# Patient Record
Sex: Male | Born: 1965 | Race: White | Hispanic: No | Marital: Single | Smoking: Former smoker
Health system: Southern US, Community
[De-identification: ages and names within clinical notes are randomized; demographics above are authoritative.]

## PROBLEM LIST (undated history)

## (undated) DIAGNOSIS — Z933 Colostomy status: Secondary | ICD-10-CM

## (undated) DIAGNOSIS — E11319 Type 2 diabetes mellitus with unspecified diabetic retinopathy without macular edema: Secondary | ICD-10-CM

## (undated) DIAGNOSIS — L97529 Non-pressure chronic ulcer of other part of left foot with unspecified severity: Secondary | ICD-10-CM

## (undated) DIAGNOSIS — N1832 Chronic kidney disease, stage 3b: Secondary | ICD-10-CM

## (undated) DIAGNOSIS — C20 Malignant neoplasm of rectum: Secondary | ICD-10-CM

## (undated) DIAGNOSIS — C801 Malignant (primary) neoplasm, unspecified: Secondary | ICD-10-CM

## (undated) DIAGNOSIS — G629 Polyneuropathy, unspecified: Secondary | ICD-10-CM

## (undated) DIAGNOSIS — E1142 Type 2 diabetes mellitus with diabetic polyneuropathy: Secondary | ICD-10-CM

## (undated) DIAGNOSIS — L97522 Non-pressure chronic ulcer of other part of left foot with fat layer exposed: Secondary | ICD-10-CM

## (undated) DIAGNOSIS — E0843 Diabetes mellitus due to underlying condition with diabetic autonomic (poly)neuropathy: Secondary | ICD-10-CM

## (undated) DIAGNOSIS — E11621 Type 2 diabetes mellitus with foot ulcer: Secondary | ICD-10-CM

## (undated) DIAGNOSIS — E119 Type 2 diabetes mellitus without complications: Secondary | ICD-10-CM

## (undated) HISTORY — PX: COLON SURGERY: SHX602

---

## 2019-04-11 ENCOUNTER — Encounter: Payer: Self-pay | Admitting: Emergency Medicine

## 2019-04-11 ENCOUNTER — Emergency Department: Payer: Medicare Other

## 2019-04-11 ENCOUNTER — Other Ambulatory Visit: Payer: Self-pay

## 2019-04-11 ENCOUNTER — Emergency Department
Admission: EM | Admit: 2019-04-11 | Discharge: 2019-04-12 | Disposition: A | Discharge status: Home / Self Care | Payer: Medicare Other | Attending: Emergency Medicine | Admitting: Emergency Medicine

## 2019-04-11 DIAGNOSIS — Y92009 Unspecified place in unspecified non-institutional (private) residence as the place of occurrence of the external cause: Secondary | ICD-10-CM | POA: Insufficient documentation

## 2019-04-11 DIAGNOSIS — Z87891 Personal history of nicotine dependence: Secondary | ICD-10-CM | POA: Insufficient documentation

## 2019-04-11 DIAGNOSIS — Y9301 Activity, walking, marching and hiking: Secondary | ICD-10-CM | POA: Diagnosis not present

## 2019-04-11 DIAGNOSIS — Y999 Unspecified external cause status: Secondary | ICD-10-CM | POA: Insufficient documentation

## 2019-04-11 DIAGNOSIS — R030 Elevated blood-pressure reading, without diagnosis of hypertension: Secondary | ICD-10-CM | POA: Diagnosis not present

## 2019-04-11 DIAGNOSIS — Y92000 Kitchen of unspecified non-institutional (private) residence as  the place of occurrence of the external cause: Secondary | ICD-10-CM | POA: Diagnosis not present

## 2019-04-11 DIAGNOSIS — E119 Type 2 diabetes mellitus without complications: Secondary | ICD-10-CM | POA: Diagnosis not present

## 2019-04-11 DIAGNOSIS — Z85038 Personal history of other malignant neoplasm of large intestine: Secondary | ICD-10-CM | POA: Diagnosis not present

## 2019-04-11 DIAGNOSIS — W010XXA Fall on same level from slipping, tripping and stumbling without subsequent striking against object, initial encounter: Secondary | ICD-10-CM | POA: Diagnosis not present

## 2019-04-11 DIAGNOSIS — M549 Dorsalgia, unspecified: Secondary | ICD-10-CM | POA: Diagnosis not present

## 2019-04-11 DIAGNOSIS — R55 Syncope and collapse: Secondary | ICD-10-CM | POA: Diagnosis not present

## 2019-04-11 DIAGNOSIS — Y9389 Activity, other specified: Secondary | ICD-10-CM | POA: Diagnosis not present

## 2019-04-11 DIAGNOSIS — W19XXXA Unspecified fall, initial encounter: Secondary | ICD-10-CM

## 2019-04-11 DIAGNOSIS — Z933 Colostomy status: Secondary | ICD-10-CM | POA: Diagnosis not present

## 2019-04-11 HISTORY — DX: Type 2 diabetes mellitus without complications: E11.9

## 2019-04-11 HISTORY — DX: Malignant (primary) neoplasm, unspecified: C80.1

## 2019-04-11 LAB — COMPREHENSIVE METABOLIC PANEL
ALT: 12 U/L (ref 0–44)
AST: 22 U/L (ref 15–41)
Albumin: 2.2 g/dL — ABNORMAL LOW (ref 3.5–5.0)
Alkaline Phosphatase: 101 U/L (ref 38–126)
Anion gap: 8 (ref 5–15)
BUN: 26 mg/dL — ABNORMAL HIGH (ref 6–20)
CO2: 28 mmol/L (ref 22–32)
Calcium: 8.6 mg/dL — ABNORMAL LOW (ref 8.9–10.3)
Chloride: 97 mmol/L — ABNORMAL LOW (ref 98–111)
Creatinine, Ser: 1.49 mg/dL — ABNORMAL HIGH (ref 0.61–1.24)
GFR calc Af Amer: >60 mL/min (ref >60)
GFR calc non Af Amer: 52 mL/min — ABNORMAL LOW (ref >60)
Glucose, Bld: 373 mg/dL — ABNORMAL HIGH (ref 70–99)
Potassium: 3.9 mmol/L (ref 3.5–5.1)
Sodium: 133 mmol/L — ABNORMAL LOW (ref 135–145)
Total Bilirubin: 0.6 mg/dL (ref 0.3–1.2)
Total Protein: 5.8 g/dL — ABNORMAL LOW (ref 6.5–8.1)

## 2019-04-11 LAB — URINALYSIS, COMPLETE (UACMP) WITH MICROSCOPIC
Bacteria, UA: NONE SEEN
Bilirubin Urine: NEGATIVE
Glucose, UA: >=500 mg/dL — AB
Hgb urine dipstick: NEGATIVE
Ketones, ur: NEGATIVE mg/dL
Leukocytes,Ua: NEGATIVE
Nitrite: NEGATIVE
Protein, ur: >=300 mg/dL — AB
Specific Gravity, Urine: 1.014 (ref 1.005–1.030)
Squamous Epithelial / HPF: NONE SEEN (ref 0–5)
pH: 6 (ref 5.0–8.0)

## 2019-04-11 LAB — CBC
HCT: 30.4 % — ABNORMAL LOW (ref 39.0–52.0)
Hemoglobin: 9.9 g/dL — ABNORMAL LOW (ref 13.0–17.0)
MCH: 27.5 pg (ref 26.0–34.0)
MCHC: 32.6 g/dL (ref 30.0–36.0)
MCV: 84.4 fL (ref 80.0–100.0)
Platelets: 264 10*3/uL (ref 150–400)
RBC: 3.6 MIL/uL — ABNORMAL LOW (ref 4.22–5.81)
RDW: 13 % (ref 11.5–15.5)
WBC: 7.1 10*3/uL (ref 4.0–10.5)
nRBC: 0 % (ref 0.0–0.2)

## 2019-04-11 LAB — TROPONIN I (HIGH SENSITIVITY)
Troponin I (High Sensitivity): 40 ng/L — ABNORMAL HIGH (ref <18)
Troponin I (High Sensitivity): 43 ng/L — ABNORMAL HIGH (ref <18)

## 2019-04-11 MED ORDER — CYCLOBENZAPRINE HCL 10 MG PO TABS: 10.0000 mg | ORAL_TABLET | Freq: Three times a day (TID) | ORAL | 0 refills | Status: DC | PRN

## 2019-04-11 MED ORDER — SODIUM CHLORIDE 0.9 % IV BOLUS
1000.0000 mL | Freq: Once | INTRAVENOUS | Status: AC
  Administered 2019-04-11: 1000 mL via INTRAVENOUS

## 2019-04-11 MED ORDER — ACETAMINOPHEN 500 MG PO TABS
1000.0000 mg | ORAL_TABLET | Freq: Once | ORAL | Status: AC
  Administered 2019-04-11: 1000 mg via ORAL
  Filled 2019-04-11: qty 2

## 2019-04-11 MED ORDER — CYCLOBENZAPRINE HCL 10 MG PO TABS
10.0000 mg | ORAL_TABLET | Freq: Once | ORAL | Status: AC
  Administered 2019-04-11: 10 mg via ORAL
  Filled 2019-04-11: qty 1

## 2019-04-11 NOTE — ED Notes (Signed)
Pt trx to x-ray 

## 2019-04-11 NOTE — ED Notes (Signed)
Family updated as to patient's status.provided pt w/ phone to speak w/ daughter.

## 2019-04-11 NOTE — ED Notes (Signed)
EMS reported pt had not been compliant with his insulin however pt states he takes his insulin intermittently.

## 2019-04-11 NOTE — ED Notes (Signed)
Pt on cell phone in the bed at this time.

## 2019-04-11 NOTE — ED Triage Notes (Signed)
Pt arrives from home following a syncopal episode at home with a fall approximately 1.5 hours ago. Pt stated to EMS he has been dizzy and not feeling right all day. Pt had BG 388 for EMS, pt insulin dependent diabetic but has not been compliant with meds since July. Pt was hypertensive for EMS w/ BP 190/110.

## 2019-04-11 NOTE — ED Notes (Signed)
Pt states he had been feeling dizzy today and got up to fix something to eat and had a near syncopal episode. Pt states he got real dizzy and almost lost consciousness. Pt states he fell flat on his back hurting the posterior ribs and spine.

## 2019-04-11 NOTE — ED Provider Notes (Signed)
[facility] Emergency Department Provider Note  Time seen: 8:26 PM  I have reviewed the triage vital signs and the nursing notes.   HISTORY  Chief Complaint Fall   HPI Tibor Zakowski is a 54 y.o. male with a past medical history of diabetes, colon cancer status post colostomy, presents to the emergency department for syncopal episode and back pain.  According to the patient states he was feeling somewhat lightheaded today, states he went to go to the kitchen to get something to eat and states while returning back from the kitchen he felt very lightheaded and had a brief syncopal versus near syncopal event and fell backwards landing flat on his back.  Patient states pain in his mid back since falling.  Patient denies any recent nausea vomiting, fever cough congestion or shortness of breath.  Patient is insulin-dependent diabetic, states he has missed a few doses here and there but overall takes his insulin as prescribed.   Past Medical History:  Diagnosis Date  . Cancer ([place])   . Diabetes mellitus without complication ([place])     There are no problems to display for this patient.   Past Surgical History:  Procedure Laterality Date  . COLON SURGERY      Prior to Admission medications   Not on File    Not on File  History reviewed. No pertinent family history.  Social History Social History   Tobacco Use  . Smoking status: Former Smoker    Quit date: 12/09/2014    Years since quitting: 4.3  . Smokeless tobacco: Never Used  Substance Use Topics  . Alcohol use: Not Currently  . Drug use: Yes    Types: Marijuana    Comment: intermittent    Review of Systems Constitutional: Possible brief LOC Cardiovascular: Negative for chest pain. Respiratory: Negative for shortness of breath. Gastrointestinal: Negative for abdominal pain, vomiting  Musculoskeletal: Negative for musculoskeletal complaints Neurological: Negative for headache All other ROS  negative  ____________________________________________   PHYSICAL EXAM:  VITAL SIGNS: ED Triage Vitals  Enc Vitals Group     BP 04/11/19 2015 (!) 167/104     Pulse Rate 04/11/19 2015 61     Resp 04/11/19 2015 17     Temp 04/11/19 2015 98.5 F (36.9 C)     Temp Source 04/11/19 2015 Oral     SpO2 04/11/19 2015 100 %     Weight 04/11/19 2017 175 lb (79.4 kg)     Height 04/11/19 2017 5' 6.5" (1.689 m)     Head Circumference --      Peak Flow --      Pain Score 04/11/19 2015 6     Pain Loc --      Pain Edu? --      Excl. in [place]? --     Constitutional: Alert and oriented. Well appearing and in no distress. Eyes: Normal exam ENT      Head: Normocephalic and atraumatic.      Mouth/Throat: Mucous membranes are moist. Cardiovascular: Normal rate, regular rhythm.  Respiratory: Normal respiratory effort without tachypnea nor retractions. Breath sounds are clear Gastrointestinal: Soft and nontender. No distention.  Musculoskeletal: Nontender with normal range of motion in all extremities.  Neurologic:  Normal speech and language. No gross focal neurologic deficits Skin:  Skin is warm, dry and intact.  Psychiatric: Mood and affect are normal.   ____________________________________________    EKG  EKG viewed and interpreted by myself shows a normal sinus rhythm at  74 bpm with a narrow QRS, normal axis, normal intervals, no concerning ST changes.  ____________________________________________    RADIOLOGY  X-rays are negative for acute abnormality.  ____________________________________________   INITIAL IMPRESSION / ASSESSMENT AND PLAN / ED COURSE  Pertinent labs & imaging results that were available during my care of the patient were reviewed by me and considered in my medical decision making (see chart for details).   Patient presents to the emergency department for syncopal event and back pain after falling.  Overall patient appears well, reassuring vitals besides mild  hypertension.  Reassuring physical exam besides mid back pain.  We will obtain x-ray images of the thoracic and lumbar spine.  We will check labs including cardiac enzyme as well as abdominal labs, IV hydrate and continue to closely monitor.  Patient agreeable plan of care.  Does not wish for pain medication at this time.  Patient's labs show an elevated blood glucose, but normal anion gap.  Does show some mild renal insufficiency.  We will IV hydrate.  X-rays are negative for traumatic injury.  Patient is troponin has resulted elevated at 40 however given his renal insufficiency this could be related to his kidney disease.  We will repeat a troponin to ensure no ACS.  EKG is reassuring.  Patient does not wish for any pain medication for his back but states he continues have some back pain.  Description seems more of paraspinal spasm.  X-rays are negative.  We will dose Flexeril and Tylenol.  Pt care signed out to Dr. Owens Shark.   Tabius Torain was evaluated in Emergency Department on 04/11/2019 for the symptoms described in the history of present illness. He was evaluated in the context of the global COVID-19 pandemic, which necessitated consideration that the patient might be at risk for infection with the SARS-CoV-2 virus that causes COVID-19. Institutional protocols and algorithms that pertain to the evaluation of patients at risk for COVID-19 are in a state of rapid change based on information released by regulatory bodies including the CDC and federal and state organizations. These policies and algorithms were followed during the patient's care in the ED.  ____________________________________________   FINAL CLINICAL IMPRESSION(S) / ED DIAGNOSES  Back pain Syncopal event   Harvest Dark, MD 04/11/19 2322

## 2019-04-12 MED ORDER — TRAMADOL HCL 50 MG PO TABS
50.0000 mg | ORAL_TABLET | Freq: Once | ORAL | Status: AC
  Administered 2019-04-12: 50 mg via ORAL
  Filled 2019-04-12: qty 1

## 2019-05-05 ENCOUNTER — Ambulatory Visit: Payer: Medicare Other | Attending: Internal Medicine

## 2019-05-05 DIAGNOSIS — Z23 Encounter for immunization: Secondary | ICD-10-CM

## 2019-05-05 NOTE — Progress Notes (Signed)
   Covid-19 Vaccination Clinic  Name:  Jason Jennings    MRN: [id number] DOB: 1965/03/27  05/05/2019  [name] was observed post Covid-19 immunization for 15 minutes without incident. He was provided with Vaccine Information Sheet and instruction to access the V-Safe system.   [name] was instructed to call 911 with any severe reactions post vaccine: Marland Kitchen Difficulty breathing  . Swelling of face and throat  . A fast heartbeat  . A bad rash all over body  . Dizziness and weakness   Immunizations Administered    Name Date Dose VIS Date Route   Pfizer COVID-19 Vaccine 05/05/2019 12:46 PM 0.3 mL 01/18/2019 Intramuscular   Manufacturer: [place]   Lot: [id number]   [place]: [id number]

## 2019-05-29 ENCOUNTER — Ambulatory Visit: Payer: Medicare Other | Attending: Internal Medicine

## 2019-05-29 DIAGNOSIS — Z23 Encounter for immunization: Secondary | ICD-10-CM

## 2019-05-29 NOTE — Progress Notes (Signed)
   Covid-19 Vaccination Clinic  Name:  Darreon Anspach    MRN: [id number] DOB: [birth year]  05/29/2019  Mr. Stofflet was observed post Covid-19 immunization for 15 minutes without incident. He was provided with Vaccine Information Sheet and instruction to access the V-Safe system.   Mr. Silkworth was instructed to call 911 with any severe reactions post vaccine: Marland Kitchen Difficulty breathing  . Swelling of face and throat  . A fast heartbeat  . A bad rash all over body  . Dizziness and weakness   Immunizations Administered    Name Date Dose VIS Date Route   Pfizer COVID-19 Vaccine 05/29/2019 10:29 AM 0.3 mL 04/03/2018 Intramuscular   Manufacturer: [place]   Lot: [id number]   [place]: [id number]

## 2020-05-16 ENCOUNTER — Emergency Department: Payer: Medicare Other

## 2020-05-16 ENCOUNTER — Encounter: Payer: Self-pay | Admitting: Intensive Care

## 2020-05-16 ENCOUNTER — Other Ambulatory Visit: Payer: Self-pay

## 2020-05-16 ENCOUNTER — Emergency Department
Admission: EM | Admit: 2020-05-16 | Discharge: 2020-05-16 | Disposition: A | Discharge status: Home / Self Care | Payer: Medicare Other | Attending: Emergency Medicine | Admitting: Emergency Medicine

## 2020-05-16 DIAGNOSIS — R112 Nausea with vomiting, unspecified: Secondary | ICD-10-CM | POA: Diagnosis not present

## 2020-05-16 DIAGNOSIS — S80912A Unspecified superficial injury of left knee, initial encounter: Secondary | ICD-10-CM | POA: Diagnosis present

## 2020-05-16 DIAGNOSIS — W109XXA Fall (on) (from) unspecified stairs and steps, initial encounter: Secondary | ICD-10-CM | POA: Insufficient documentation

## 2020-05-16 DIAGNOSIS — R12 Heartburn: Secondary | ICD-10-CM | POA: Insufficient documentation

## 2020-05-16 DIAGNOSIS — E1140 Type 2 diabetes mellitus with diabetic neuropathy, unspecified: Secondary | ICD-10-CM | POA: Insufficient documentation

## 2020-05-16 DIAGNOSIS — S82142A Displaced bicondylar fracture of left tibia, initial encounter for closed fracture: Secondary | ICD-10-CM | POA: Insufficient documentation

## 2020-05-16 DIAGNOSIS — Z859 Personal history of malignant neoplasm, unspecified: Secondary | ICD-10-CM | POA: Diagnosis not present

## 2020-05-16 DIAGNOSIS — Z87891 Personal history of nicotine dependence: Secondary | ICD-10-CM | POA: Diagnosis not present

## 2020-05-16 HISTORY — DX: Polyneuropathy, unspecified: G62.9

## 2020-05-16 LAB — COMPREHENSIVE METABOLIC PANEL
ALT: 20 U/L (ref 0–44)
AST: 32 U/L (ref 15–41)
Albumin: 3.5 g/dL (ref 3.5–5.0)
Alkaline Phosphatase: 64 U/L (ref 38–126)
Anion gap: 13 (ref 5–15)
BUN: 41 mg/dL — ABNORMAL HIGH (ref 6–20)
CO2: 24 mmol/L (ref 22–32)
Calcium: 9.7 mg/dL (ref 8.9–10.3)
Chloride: 100 mmol/L (ref 98–111)
Creatinine, Ser: 2.19 mg/dL — ABNORMAL HIGH (ref 0.61–1.24)
GFR, Estimated: 34 mL/min — ABNORMAL LOW (ref >60)
Glucose, Bld: 441 mg/dL — ABNORMAL HIGH (ref 70–99)
Potassium: 4.4 mmol/L (ref 3.5–5.1)
Sodium: 137 mmol/L (ref 135–145)
Total Bilirubin: 0.5 mg/dL (ref 0.3–1.2)
Total Protein: 6.4 g/dL — ABNORMAL LOW (ref 6.5–8.1)

## 2020-05-16 LAB — CBC WITH DIFFERENTIAL/PLATELET
Abs Immature Granulocytes: 0.02 10*3/uL (ref 0.00–0.07)
Basophils Absolute: 0 10*3/uL (ref 0.0–0.1)
Basophils Relative: 0 %
Eosinophils Absolute: 0 10*3/uL (ref 0.0–0.5)
Eosinophils Relative: 0 %
HCT: 32.9 % — ABNORMAL LOW (ref 39.0–52.0)
Hemoglobin: 11.4 g/dL — ABNORMAL LOW (ref 13.0–17.0)
Immature Granulocytes: 0 %
Lymphocytes Relative: 9 %
Lymphs Abs: 0.6 10*3/uL — ABNORMAL LOW (ref 0.7–4.0)
MCH: 28.9 pg (ref 26.0–34.0)
MCHC: 34.7 g/dL (ref 30.0–36.0)
MCV: 83.5 fL (ref 80.0–100.0)
Monocytes Absolute: 0.4 10*3/uL (ref 0.1–1.0)
Monocytes Relative: 6 %
Neutro Abs: 5.7 10*3/uL (ref 1.7–7.7)
Neutrophils Relative %: 85 %
Platelets: 197 10*3/uL (ref 150–400)
RBC: 3.94 MIL/uL — ABNORMAL LOW (ref 4.22–5.81)
RDW: 12.9 % (ref 11.5–15.5)
WBC: 6.8 10*3/uL (ref 4.0–10.5)
nRBC: 0 % (ref 0.0–0.2)

## 2020-05-16 LAB — LIPASE, BLOOD: Lipase: 42 U/L (ref 11–51)

## 2020-05-16 LAB — TROPONIN I (HIGH SENSITIVITY): Troponin I (High Sensitivity): 20 ng/L — ABNORMAL HIGH (ref <18)

## 2020-05-16 MED ORDER — HYDROCODONE-ACETAMINOPHEN 5-325 MG PO TABS: 1.0000 | ORAL_TABLET | ORAL | 0 refills | Status: DC | PRN

## 2020-05-16 MED ORDER — ONDANSETRON HCL 8 MG PO TABS: 8.0000 mg | ORAL_TABLET | Freq: Three times a day (TID) | ORAL | 1 refills | Status: DC | PRN

## 2020-05-16 MED ORDER — ONDANSETRON HCL 4 MG/2ML IJ SOLN
4.0000 mg | Freq: Once | INTRAMUSCULAR | Status: AC
Start: 2020-05-16 — End: 2020-05-16
  Administered 2020-05-16: 4 mg via INTRAVENOUS
  Filled 2020-05-16: qty 2

## 2020-05-16 MED ORDER — ONDANSETRON 4 MG PO TBDP
ORAL_TABLET | ORAL | Status: AC
  Administered 2020-05-16: 8 mg
  Filled 2020-05-16: qty 2

## 2020-05-16 NOTE — ED Notes (Signed)
Pt back from x-ray. Continuous cardiac and pulse ox monitoring.

## 2020-05-16 NOTE — ED Triage Notes (Signed)
Patient reports mechanical fall down three stairs and injuring left knee. Patient actively vomiting during triage. HX colon cancer and in remission. Colostomy present

## 2020-05-16 NOTE — ED Provider Notes (Signed)
[facility] Emergency Department Provider Note  ____________________________________________  Time seen: Approximately 5:44 PM  I have reviewed the triage vital signs and the nursing notes.   HISTORY  Chief Complaint Knee Pain and Emesis    HPI Jason Jennings is a 55 y.o. male who presents the emergency department complaining of left knee pain as well as "heartburn" with nausea and emesis.  Patient states that he was coming down a flight of stairs.  He has a history of neuropathy, states that he has to take them one at a time.  He was stepping down with the left leg when the right leg gave out from underneath him.  This is not a typical but patient states that he missed a stair, landed awkwardly on the left knee and had immediate pain.  No other injury from this encounter.  He is having pain, swelling to the left knee and is unable to bear weight currently.  Patient states that he is also been having some heartburn and nausea and emesis that started after the injury.  No history of chronic GERD symptoms.  No cardiac history.  He denies any shortness of breath.  Patient states there is some discomfort in the epigastric region but it does not radiate anywhere.  Vomiting is nonbilious and nonbloody.         Past Medical History:  Diagnosis Date  . Cancer ([place])   . Diabetes mellitus without complication ([place])   . Neuropathy     There are no problems to display for this patient.   Past Surgical History:  Procedure Laterality Date  . COLON SURGERY      Prior to Admission medications   Medication Sig Start Date End Date Taking? Authorizing Provider  HYDROcodone-acetaminophen (NORCO/VICODIN) 5-325 MG tablet Take 1 tablet by mouth every 4 (four) hours as needed for moderate pain. 05/16/20 05/16/21 Yes Clifton Kovacic, Charline Bills, PA-C  ondansetron (ZOFRAN) 8 MG tablet Take 1 tablet (8 mg total) by mouth every 8 (eight) hours as needed for nausea or vomiting. 05/16/20  Yes  Odie Rauen, Charline Bills, PA-C  cyclobenzaprine (FLEXERIL) 10 MG tablet Take 1 tablet (10 mg total) by mouth 3 (three) times daily as needed for muscle spasms. 04/11/19   Harvest Dark, MD    Allergies Patient has no known allergies.  History reviewed. No pertinent family history.  Social History Social History   Tobacco Use  . Smoking status: Former Smoker    Quit date: 12/09/2014    Years since quitting: 5.4  . Smokeless tobacco: Never Used  Vaping Use  . Vaping Use: Never used  Substance Use Topics  . Alcohol use: Not Currently  . Drug use: Not Currently    Types: Marijuana     Review of Systems  Constitutional: No fever/chills Eyes: No visual changes. No discharge ENT: No upper respiratory complaints. Cardiovascular: no chest pain.Marland Kitchen  Positive for "heartburn." Respiratory: no cough. No SOB. Gastrointestinal: No abdominal pain.  Positive for nausea and emesis no diarrhea.  No constipation. Musculoskeletal: Positive for left knee pain/injury Skin: Negative for rash, abrasions, lacerations, ecchymosis. Neurological: Negative for headaches, focal weakness or numbness.  10 System ROS otherwise negative.  ____________________________________________   PHYSICAL EXAM:  VITAL SIGNS: ED Triage Vitals [05/16/20 1740]  Enc Vitals Group     BP      Pulse      Resp      Temp 97.9 F (36.6 C)     Temp Source Oral  SpO2      Weight 195 lb (88.5 kg)     Height 5' 6.5" (1.689 m)     Head Circumference      Peak Flow      Pain Score 10     Pain Loc      Pain Edu?      Excl. in [place]?      Constitutional: Alert and oriented. Well appearing and in no acute distress. Eyes: Conjunctivae are normal. PERRL. EOMI. Head: Atraumatic. ENT:      Ears:       Nose: No congestion/rhinnorhea.      Mouth/Throat: Mucous membranes are moist.  Neck: No stridor.    Cardiovascular: Normal rate, regular rhythm. Normal S1 and S2.  No appreciable murmurs, rubs, gallops.  Good  peripheral circulation. Respiratory: Normal respiratory effort without tachypnea or retractions. Lungs CTAB. Good air entry to the bases with no decreased or absent breath sounds. Gastrointestinal: Bowel sounds 4 quadrants.  Soft to palpation all quadrants.  Minimally tender to palpation in the epigastric region.. No guarding or rigidity. No palpable masses. No distention. No CVA tenderness. Musculoskeletal: Full range of motion to all extremities. No gross deformities appreciated.  Evaluation of the right knee reveals mild edema globally about the knee with positive ballottement on exam.  Patient is tender along the medial joint line and over the patella itself.  No other tenderness to palpation.  No palpable crepitus.  Special tests of the knee are deferred until imaging returns.  Dorsalis pedis pulses sensation intact distally.  After imaging returns with findings consistent with fracture, no range of motion was performed. Neurologic:  Normal speech and language. No gross focal neurologic deficits are appreciated.  Skin:  Skin is warm, dry and intact. No rash noted. Psychiatric: Mood and affect are normal. Speech and behavior are normal. Patient exhibits appropriate insight and judgement.   ____________________________________________   LABS (all labs ordered are listed, but only abnormal results are displayed)  Labs Reviewed  COMPREHENSIVE METABOLIC PANEL - Abnormal; Notable for the following components:      Result Value   Glucose, Bld 441 (*)    BUN 41 (*)    Creatinine, Ser 2.19 (*)    Total Protein 6.4 (*)    GFR, Estimated 34 (*)    All other components within normal limits  CBC WITH DIFFERENTIAL/PLATELET - Abnormal; Notable for the following components:   RBC 3.94 (*)    Hemoglobin 11.4 (*)    HCT 32.9 (*)    Lymphs Abs 0.6 (*)    All other components within normal limits  TROPONIN I (HIGH SENSITIVITY) - Abnormal; Notable for the following components:   Troponin I (High  Sensitivity) 20 (*)    All other components within normal limits  LIPASE, BLOOD  TROPONIN I (HIGH SENSITIVITY)   ____________________________________________  EKG   ____________________________________________  RADIOLOGY I personally viewed and evaluated these images as part of my medical decision making, as well as reviewing the written report by the radiologist.  ED Provider Interpretation: Lateral tibial plateau fracture with 7 mm of depression.  DG Chest 2 View  Result Date: 05/16/2020 CLINICAL DATA:  Chest pain. EXAM: CHEST - 2 VIEW COMPARISON:  None. FINDINGS: Cardiomediastinal silhouette is normal. Mediastinal contours appear intact. There is no evidence of focal airspace consolidation, pleural effusion or pneumothorax. There is a mild compression deformity of one of the lower thoracic vertebral bodies. Soft tissues are grossly normal. IMPRESSION: 1. No active cardiopulmonary disease.  2. Mild compression deformity of one of the lower thoracic vertebral bodies. Please correlate to point tenderness. Further evaluation with thoracic spine radiographs may be considered. Electronically Signed   By: Fidela Salisbury M.D.   On: 05/16/2020 18:49   DG Knee Complete 4 Views Left  Result Date: 05/16/2020 CLINICAL DATA:  Left knee pain and swelling after falling down stairs today. EXAM: LEFT KNEE - COMPLETE 4+ VIEW COMPARISON:  None. FINDINGS: There is a mildly comminuted lateral tibial plateau fracture with approximately 7 mm of depression. This extends into the central aspect of the proximal tibial metaphysis. There is an associated moderate to large sized effusion with a fat/fluid level. No additional fractures are seen. IMPRESSION: Mildly comminuted and depressed lateral tibial plateau fracture with an associated lipohemarthrosis. Electronically Signed   By: Claudie Revering M.D.   On: 05/16/2020 18:54    ____________________________________________    PROCEDURES  Procedure(s) performed:     Procedures    Medications  ondansetron (ZOFRAN) injection 4 mg (4 mg Intravenous Given 05/16/20 1852)  ondansetron (ZOFRAN-ODT) 4 MG disintegrating tablet (8 mg  Given 05/16/20 2032)     ____________________________________________   INITIAL IMPRESSION / ASSESSMENT AND PLAN / ED COURSE  Pertinent labs & imaging results that were available during my care of the patient were reviewed by me and considered in my medical decision making (see chart for details).  Review of the [place] CSRS was performed in accordance of the [place] prior to dispensing any controlled drugs.           Patient's diagnosis is consistent with tibial plateau fracture.  Patient presented to the emergency department after sustaining an injury to the left leg.  Patient landed awkwardly on his left leg while missing a step.  Patient had obvious edema and limited range of motion.  Imaging revealed tibial plateau fracture.  Discussed this fracture with on-call orthopedic surgeon, Dr. Gaspar Bidding,  who advised placed patient in a knee immobilizer and follow-up with trauma orthopedic at a tertiary center outpatient.  I discussed the findings with the patient.  He is agreeable with same.  Patient has had some nausea which appears to be secondary to pain.  With no movement, patient had no increase symptoms.  His episode of emesis while in the emergency department occurred after he moved the left knee, he became pale, lightheaded, had an episode of emesis.  Once his knee was no longer moving, symptoms went away.  Labs are reassuring at this time..  Strict return precautions are discussed with the patient regarding compartment syndrome and DVT.  Return precautions for patient's emesis is also discussed.  Follow-up with trauma Ortho as described above.  Return to the emergency department for concerning signs and symptoms.    ____________________________________________  FINAL CLINICAL IMPRESSION(S) / ED DIAGNOSES  Final diagnoses:   Closed fracture of left tibial plateau, initial encounter      NEW MEDICATIONS STARTED DURING THIS VISIT:  ED Discharge Orders         Ordered    HYDROcodone-acetaminophen (NORCO/VICODIN) 5-325 MG tablet  Every 4 hours PRN        05/16/20 2045    ondansetron (ZOFRAN) 8 MG tablet  Every 8 hours PRN        05/16/20 2045              This chart was dictated using voice recognition software/Dragon. Despite best efforts to proofread, errors can occur which can change the meaning. Any change was purely unintentional.  Brynda Peon 05/16/20 2049    Carrie Mew, MD 05/17/20 0001

## 2020-05-16 NOTE — ED Notes (Signed)
Pt to xray

## 2020-06-29 ENCOUNTER — Other Ambulatory Visit: Payer: Self-pay

## 2020-06-29 ENCOUNTER — Emergency Department
Admission: EM | Admit: 2020-06-29 | Discharge: 2020-06-29 | Disposition: A | Discharge status: Home / Self Care | Payer: Medicare Other | Attending: Emergency Medicine | Admitting: Emergency Medicine

## 2020-06-29 DIAGNOSIS — E119 Type 2 diabetes mellitus without complications: Secondary | ICD-10-CM | POA: Diagnosis not present

## 2020-06-29 DIAGNOSIS — J339 Nasal polyp, unspecified: Secondary | ICD-10-CM | POA: Diagnosis not present

## 2020-06-29 DIAGNOSIS — J340 Abscess, furuncle and carbuncle of nose: Secondary | ICD-10-CM

## 2020-06-29 DIAGNOSIS — Z859 Personal history of malignant neoplasm, unspecified: Secondary | ICD-10-CM | POA: Insufficient documentation

## 2020-06-29 DIAGNOSIS — R220 Localized swelling, mass and lump, head: Secondary | ICD-10-CM | POA: Diagnosis present

## 2020-06-29 DIAGNOSIS — L03211 Cellulitis of face: Secondary | ICD-10-CM | POA: Insufficient documentation

## 2020-06-29 DIAGNOSIS — Z87891 Personal history of nicotine dependence: Secondary | ICD-10-CM | POA: Insufficient documentation

## 2020-06-29 MED ORDER — CLINDAMYCIN HCL 300 MG PO CAPS: 300.0000 mg | ORAL_CAPSULE | Freq: Three times a day (TID) | ORAL | 0 refills | Status: AC

## 2020-06-29 MED ORDER — KETOROLAC TROMETHAMINE 30 MG/ML IJ SOLN: 30.0000 mg | Freq: Once | INTRAMUSCULAR | Status: DC

## 2020-06-29 MED ORDER — METHYLPREDNISOLONE 4 MG PO TBPK: ORAL_TABLET | ORAL | 0 refills | Status: DC

## 2020-06-29 MED ORDER — DEXAMETHASONE SODIUM PHOSPHATE 10 MG/ML IJ SOLN
10.0000 mg | Freq: Once | INTRAMUSCULAR | Status: AC
  Administered 2020-06-29: 10 mg via INTRAVENOUS
  Filled 2020-06-29: qty 1

## 2020-06-29 MED ORDER — CLINDAMYCIN PHOSPHATE 600 MG/50ML IV SOLN
600.0000 mg | Freq: Once | INTRAVENOUS | Status: AC
  Administered 2020-06-29: 600 mg via INTRAVENOUS
  Filled 2020-06-29: qty 50

## 2020-06-29 NOTE — ED Provider Notes (Signed)
[facility] Emergency Department Provider Note   ____________________________________________   Event Date/Time   First MD Initiated Contact with Patient 06/29/20 1338     (approximate)  I have reviewed the triage vital signs and the nursing notes.   HISTORY  Chief Complaint Facial Swelling    HPI Jason Jennings is a 55 y.o. male patient state nasal edema secondary to a polyp in the left nare.  Patient saying he is not experiencing more swollen and redness to the left exterior nare.  Rates his pain as 8/10.  States pain is controlled with the narcotic medication he is using for his knee pain.  No other palliative measure for complaint.         Past Medical History:  Diagnosis Date  . Cancer ([place])   . Diabetes mellitus without complication ([place])   . Neuropathy     There are no problems to display for this patient.   Past Surgical History:  Procedure Laterality Date  . COLON SURGERY      Prior to Admission medications   Medication Sig Start Date End Date Taking? Authorizing Provider  clindamycin (CLEOCIN) 300 MG capsule Take 1 capsule (300 mg total) by mouth 3 (three) times daily for 10 days. 06/29/20 07/09/20 Yes Sable Feil, PA-C  methylPREDNISolone (MEDROL DOSEPAK) 4 MG TBPK tablet Take Tapered dose as directed 06/29/20  Yes Sable Feil, PA-C  ondansetron (ZOFRAN) 8 MG tablet Take 1 tablet (8 mg total) by mouth every 8 (eight) hours as needed for nausea or vomiting. 05/16/20   Cuthriell, Charline Bills, PA-C    Allergies Patient has no known allergies.  No family history on file.  Social History Social History   Tobacco Use  . Smoking status: Former Smoker    Quit date: 12/09/2014    Years since quitting: 5.5  . Smokeless tobacco: Never Used  Vaping Use  . Vaping Use: Never used  Substance Use Topics  . Alcohol use: Not Currently  . Drug use: Not Currently    Types: Marijuana    Review of Systems Constitutional: No  fever/chills Eyes: No visual changes. ENT: No sore throat. Cardiovascular: Denies chest pain. Respiratory: Denies shortness of breath. Gastrointestinal: No abdominal pain.  No nausea, no vomiting.  No diarrhea.  No constipation. Genitourinary: Negative for dysuria. Musculoskeletal: Negative for back pain. Skin: Negative for rash. Neurological: Negative for headaches, focal weakness or numbness. Endocrine:  Diabetes   ____________________________________________   PHYSICAL EXAM:  VITAL SIGNS: ED Triage Vitals  Enc Vitals Group     BP 06/29/20 1141 (!) 145/81     Pulse Rate 06/29/20 1141 77     Resp 06/29/20 1141 18     Temp 06/29/20 1141 98.3 F (36.8 C)     Temp Source 06/29/20 1141 Oral     SpO2 06/29/20 1141 98 %     Weight 06/29/20 1317 195 lb 1.7 oz (88.5 kg)     Height 06/29/20 1317 5' 6.5" (1.689 m)     Head Circumference --      Peak Flow --      Pain Score 06/29/20 1142 8     Pain Loc --      Pain Edu? --      Excl. in [place]? --     Constitutional: Alert and oriented. Well appearing and in no acute distress. Eyes: Conjunctivae are normal. PERRL. EOMI. Head: Atraumatic. Nose: No congestion/rhinnorhea.  Left nasal polyp.  External left nasal edema and  erythema. Mouth/Throat: Mucous membranes are moist.  Oropharynx non-erythematous. Neck: No stridor.  Hematological/Lymphatic/Immunilogical: No cervical lymphadenopathy. Cardiovascular: Normal rate, regular rhythm. Grossly normal heart sounds.  Good peripheral circulation. Respiratory: Normal respiratory effort.  No retractions. Lungs CTAB. Musculoskeletal: Wearing knee brace on left lower extremity.  No lower extremity tenderness nor edema.  No joint effusions. Neurologic:  Normal speech and language. No gross focal neurologic deficits are appreciated. No gait instability. Skin:  Skin is warm, dry and intact. No rash noted. Psychiatric: Mood and affect are normal. Speech and behavior are  normal.  ____________________________________________   LABS (all labs ordered are listed, but only abnormal results are displayed)  Labs Reviewed - No data to display ____________________________________________  EKG   ____________________________________________  RADIOLOGY I, Sable Feil, personally viewed and evaluated these images (plain radiographs) as part of my medical decision making, as well as reviewing the written report by the radiologist.  ED MD interpretation:    Official radiology report(s): No results found.  ____________________________________________   PROCEDURES  Procedure(s) performed (including Critical Care):  Procedures   ____________________________________________   INITIAL IMPRESSION / ASSESSMENT AND PLAN / ED COURSE  As part of my medical decision making, I reviewed the following data within the [place]         Patient presents with left nostril edema and erythema.  Patient has a left nasal polyp.  Patient given discharge care instructions and advised to follow the ENT if there is no improvement in 2 to 3 days.  Patient also advised to closely monitor his blood sugar while taking steroids.  Return to ED if condition worsens.     ____________________________________________   FINAL CLINICAL IMPRESSION(S) / ED DIAGNOSES  Final diagnoses:  Left nasal polyps  Cellulitis of nasal tip     ED Discharge Orders         Ordered    clindamycin (CLEOCIN) 300 MG capsule  3 times daily        06/29/20 1507    methylPREDNISolone (MEDROL DOSEPAK) 4 MG TBPK tablet        06/29/20 1507          *Please note:  Jason Jennings was evaluated in Emergency Department on 06/29/2020 for the symptoms described in the history of present illness. He was evaluated in the context of the global COVID-19 pandemic, which necessitated consideration that the patient might be at risk for infection with the SARS-CoV-2 virus that causes  COVID-19. Institutional protocols and algorithms that pertain to the evaluation of patients at risk for COVID-19 are in a state of rapid change based on information released by regulatory bodies including the CDC and federal and state organizations. These policies and algorithms were followed during the patient's care in the ED.  Some ED evaluations and interventions may be delayed as a result of limited staffing during and the pandemic.*   Note:  This document was prepared using Dragon voice recognition software and may include unintentional dictation errors.    Sable Feil, PA-C 06/29/20 1515    Delman Kitten, MD 06/29/20 [phone]

## 2020-06-29 NOTE — ED Triage Notes (Signed)
Pt comes with c/o nose pain for about a week. Pt states redness, pain and some swelling to left side of nostril.

## 2020-06-29 NOTE — ED Notes (Signed)
Pt has been provided with discharge instructions. Pt denies any questions or concerns at this time. Pt verbalizes understanding for follow up care and d/c.  VSS.  Pt left department with all belongings.  

## 2020-06-29 NOTE — Discharge Instructions (Addendum)
Read and follow discharge care instruction.  Take medication as directed.

## 2020-06-29 NOTE — ED Notes (Signed)
See triage note  Presents with redness and swelling to left side of nose  Noticed area couple of days ago   Pain is mainly to inside of nare

## 2021-08-12 IMAGING — CR DG KNEE COMPLETE 4+V*L*
4 series · 4 of 4 positions shown · non-contrast
Comparison: None.

CLINICAL DATA: Left knee pain and swelling after falling down
stairs today.

EXAM:
LEFT KNEE - COMPLETE 4+ VIEW

[knee ap]
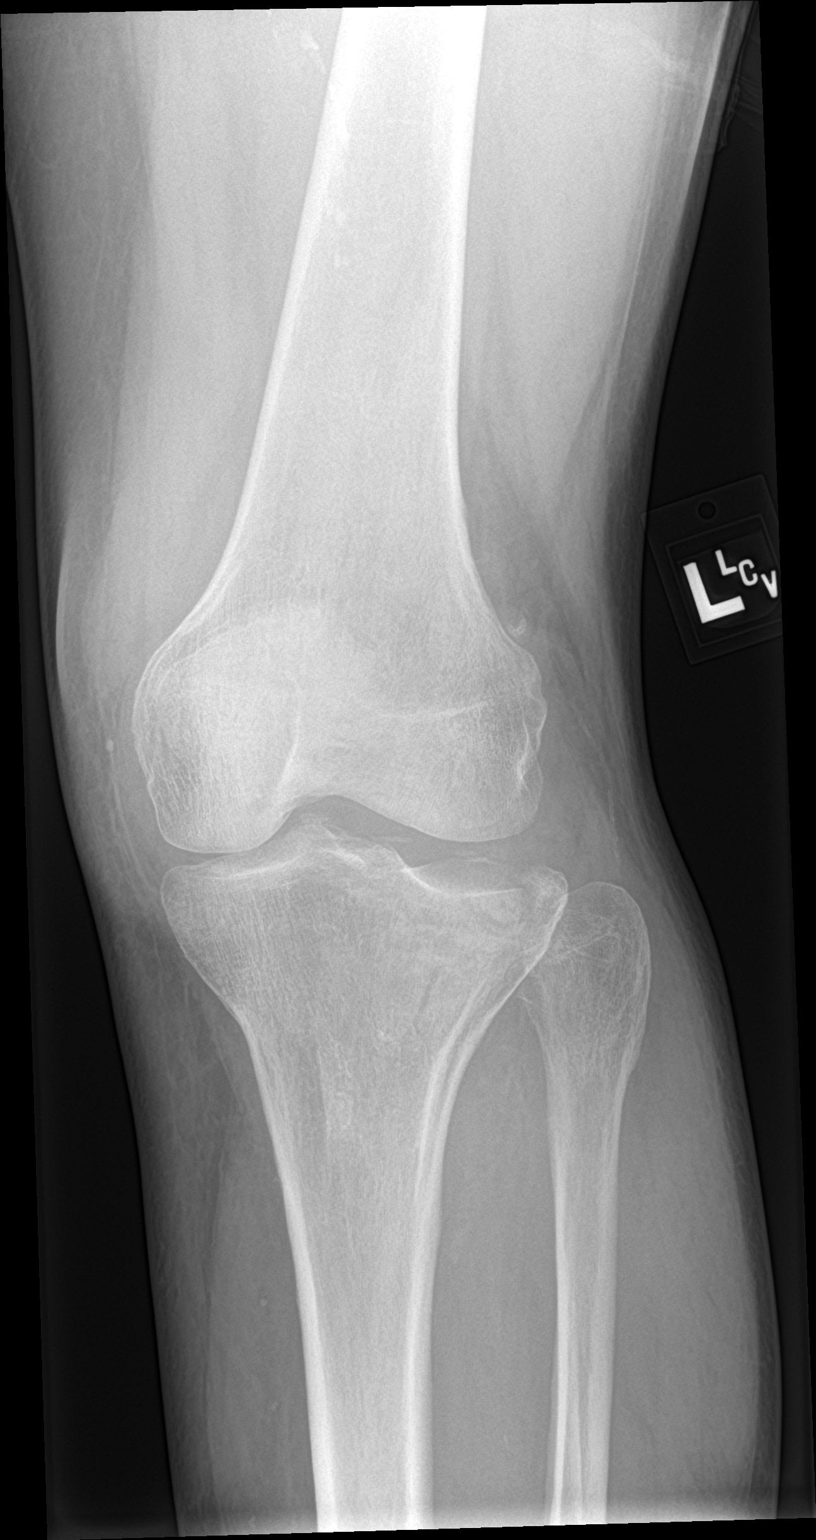

[knee obl (1 of 2)]
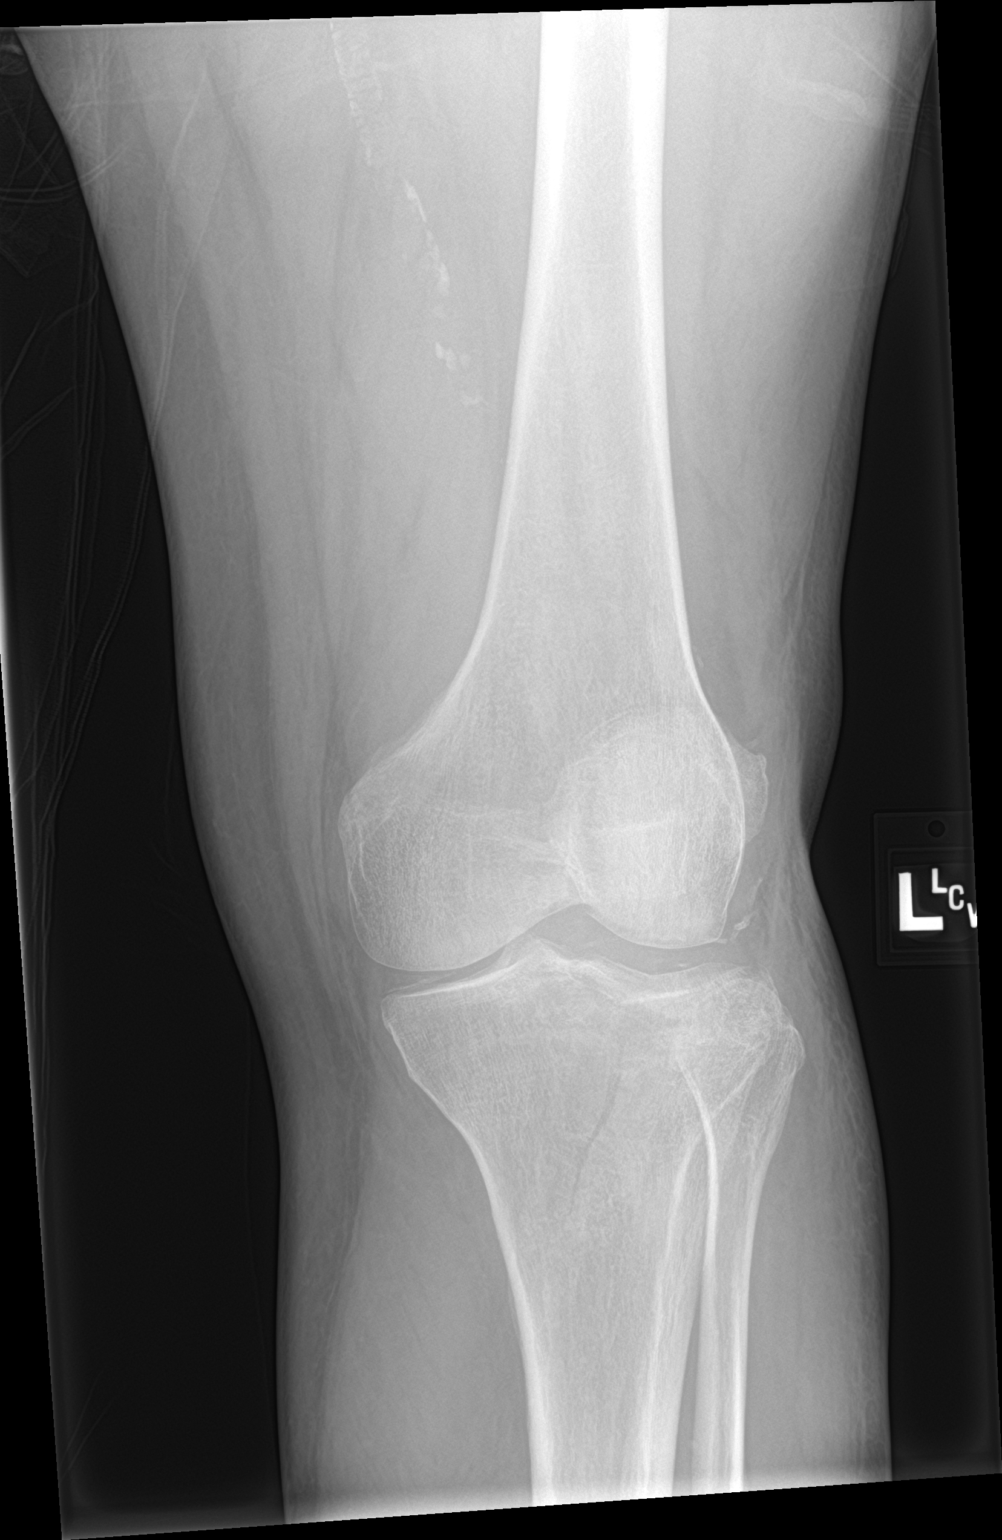

[knee obl (2 of 2)]
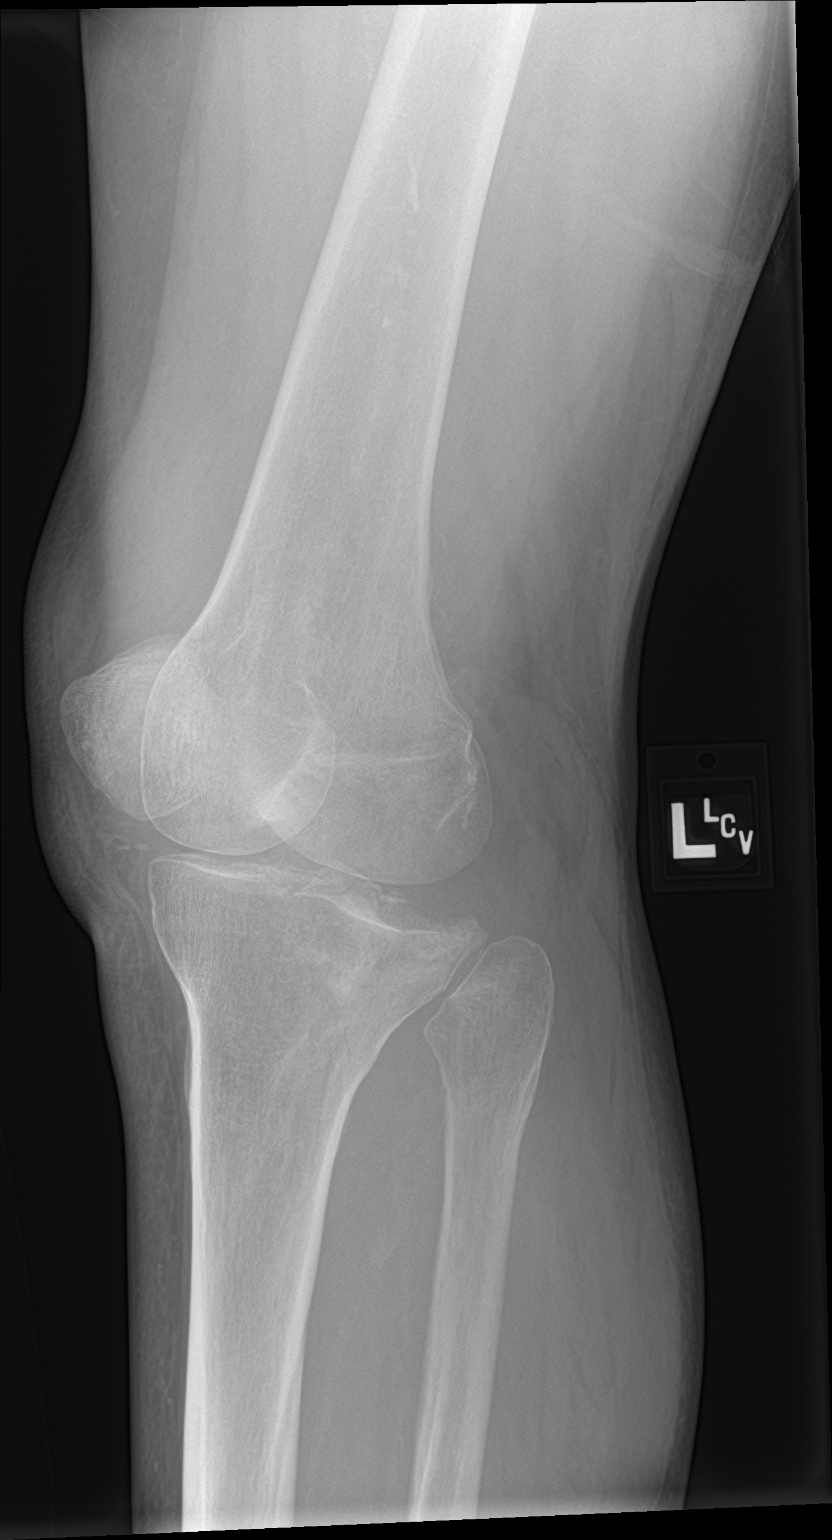

[knee lat]
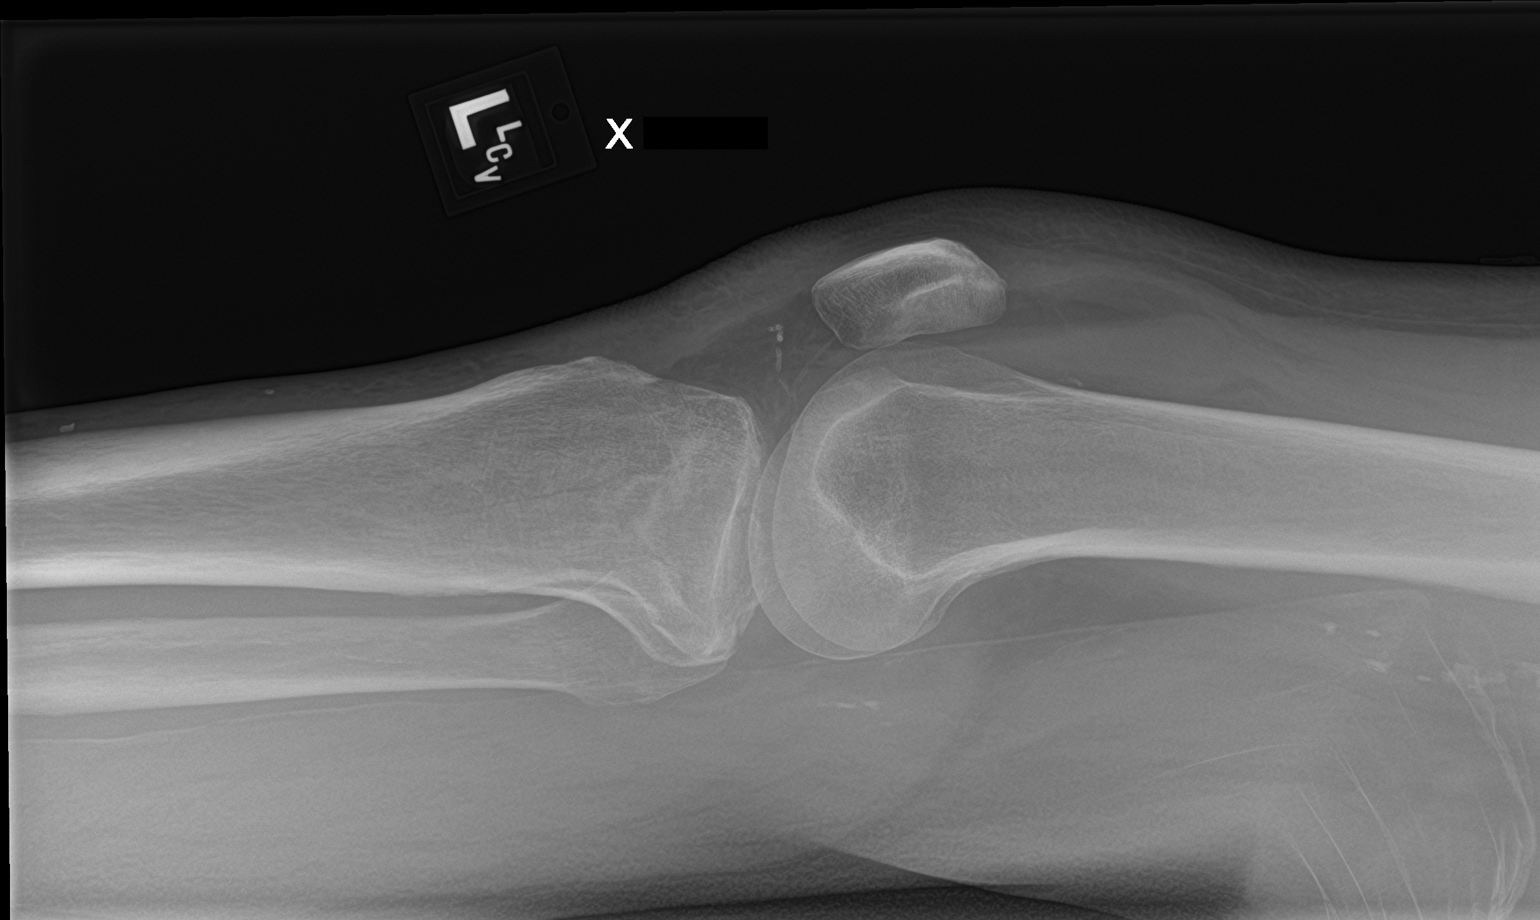

[4 of 4 positions shown; findings below may reference images not displayed]

FINDINGS: There is a mildly comminuted lateral tibial plateau fracture with
approximately 7 mm of depression. This extends into the central
aspect of the proximal tibial metaphysis. There is an associated
moderate to large sized effusion with a fat/fluid level. No
additional fractures are seen.
IMPRESSION: Mildly comminuted and depressed lateral tibial plateau fracture with
an associated lipohemarthrosis.

## 2021-12-22 ENCOUNTER — Ambulatory Visit: Admission: RE | Admit: 2021-12-22 | Payer: Medicare Other | Source: Home / Self Care | Admitting: Ophthalmology

## 2021-12-22 ENCOUNTER — Encounter: Admission: RE | Payer: Self-pay | Source: Home / Self Care

## 2021-12-22 SURGERY — PHACOEMULSIFICATION, CATARACT, WITH IOL INSERTION
Anesthesia: Topical | Laterality: Right

## 2022-10-03 NOTE — Progress Notes (Signed)
 [facility] Medical Oncology FOLLOW UP PATIENT EVALUATION   HISTORY OF PRESENT ILLNESS: This is a 57 year old gentleman with history of invasive adenocarcinoma of the rectum and anemia.   He initially presented to his primary care physician in 2016 for evaluation of intermittent bright red bleeding per rectum that occurred over the past year.  On rectal exam,  he was noted to have a rectal mass and was referred for colonoscopy.    Colonoscopy on 08/04/2014 revealed a 5 cm long non-obstructing mass in the distal rectum extending to the anal canal.   Biopsy of the rectal mass on 08/04/2014 revealed invasive adenocarcinoma, T3 Nx M0  PET-CT scan 08/15/2014 showed some radiographically suspicious subcentimeter perirectal nodes, as well as subcentimeter retroperitoneal and inguinal nodes not FDG avid. Treatment:  Neoadjuvant chemoradiation (from 09/17/14 to 10/27/2014) with capecitabine 825 mg/m2 twice daily on days of radiation (Dr Nancylee Oncology, Dr Dawna Sermon). Surgery:  Low anterior resection on 12/12/14 under the care of Dr. Cathay.  Pathology showed complete pathologic response.   CT Chest/abdomen/pelvis on 05/04/15 : No evidence of metastatic disease Unable to undergo colonoscopy due to surgery.   PET CT on 10/06/2015 showed increased uptake at anastomosis site  Patient had proctosigmoidoscopy done by Dr Cathay on 10/19/15, negative for recurrence .   Cancer recurrence, 2020:   -Follow up anoscopy in 01/2018 revealed an abnormality for which further evaluation was recommended -Colonoscopy 02/23/2018: 1cm ulcerated distal rectal mass.  Biopsy:  Invasive adenocarcinoma, MSS stable -PET CT 03/01/2018:  No evidence of metastatic disease   Referred to Dr. Aloysius Jewels for potential treatment with endoscopic resection.  EUS recommended.  Lower GI Endoscopic Ultrasound 03/16/2018: A hypoechoic mass was found in the rectum, 5 cm from the anal verge.  It was  non-circumferential and located predominantly at the left anterior rectal wall.  It measured 15 mm x 10 mm x 13.4 mm.  There was sonographic evidence suggesting invasion into the muscularis propria (T2) without breakthrough into the perirectal fat.  There was no sonographic evidence of invasion into the perirectal fat.  The perirectal space was normal.  No malignant appearing lymph nodes were seen in the perirectal region and in the left iliac region during endoscopic examination of the rectum and sigmoid colon.  Concurrent chemoradiation: : Completed a course of concurrent chemoradiation with capecitabine X 5 weeks 05/23/2018-06/26/2018.  Completed radiation therapy on 06/15/2018.  Surgery: He underwent salvage APR on 08/17/2018 with Dr. Florie.  Pathology revealed rectum with treatment related changes, negative for carcinoma.  0/14 lymph nodes involved with carcinoma.  Rectum and anus with treatment related changes, tattoo site, and fragment of underlying bone. No evidence of residual carcinoma is seen grossly or microscopically (ypT0 N0).   Following radiology review of post CRT MRI pelvis , which revealed residual fibrosis and no evidence of metastatic disease, with good clinical response, IORT was not expected to be needed.  Family History:  Mother, ovarian cancer, diagnosed age 44.  Sister, ovarian cancer, diagnosed age 76 (genetic testing in 2018 negative).  Son, testicular cancer, age 70.  Paternal grandmother, colon cancer, age 67.  Maternal Uncle, brain tumor.  Maternal uncle  (her brother), pancreatic cancer      Interval History 10/17/2022:  The patient presents today for a follow up visit.    10/14/2022 CT ABD/PEL revealed 1. Presacral soft tissue density presumably represents scar tissue given previously seen abscess at this location, though attention on follow-up  is recommended given lack of interval studies since 02/13/2019. 2. No visible mass, abnormal bowel wall thickening, or  lymphadenopathy in the abdomen pelvis. 3. Small pericardial effusion.   Today, he reports that he has started eating spinach and adding sources of iron in the diet.   He notes fatigue, nausea/emesis and reflux.  His ostomy site is without issues or concerns, with normal stool.    He denies blood per rectum.  He denies headache, lightheadedness, changes in vision, tinnitus, sore throat, cough, shortness of breath, chest pain, palpitations, abdominal pain, dysuria, edema, fever/chills, night sweats, unintentional weight loss, prolonged bleeding or evidence of active bleeding.      PAST MEDICAL HISTORY:   Patient Active Problem List  Diagnosis  . Rectal cancer (CMS-HCC)  . Hyperlipidemia associated with type 2 diabetes mellitus , unspecified (CMS-HCC)  . Polyneuropathy  . Type 2 diabetes mellitus with diabetic polyneuropathy, with long-term current use of insulin  (CMS/HHS-HCC)  . Vitamin D deficiency  . Retinopathy due to secondary diabetes (CMS/HHS-HCC)  . Erectile dysfunction following radiation therapy  . Hypertension associated with diabetes  (CMS/HHS-HCC)  . 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 Diabetic foot infection  (CMS/HHS-HCC)  . Malignant neoplasm of rectum (CMS/HHS-HCC)  . Tibial plateau fracture, left  . S/P colostomy    Past Medical History:  Diagnosis Date  . Cancer (CMS/HHS-HCC) 08/04/2014   rectal cancer  . Diabetes (CMS/HHS-HCC)   . History of anesthesia reaction    Post-anesthesia agitation, irritable  . History of bowel resection   . Kidney stones    s/p ureteral stent  . Left groin hernia   . Macular degeneration   . Neuropathy   . PONV (postoperative nausea and vomiting)    After LAR 12/2014  . Rectal cancer (CMS/HHS-HCC) 08/04/2014     PAST SURGICAL HISTORY:   Past Surgical History:  Procedure Laterality Date  . Stent placement and removal - kidney  2013   Left flank  . Retinal laser surgery  08/2014   Bilateral  .  INCISION & DRAINAGE ABSCESS ANKLE/FOOT/TOE Right 09/13/2017   Procedure: INCISION AND DRAINAGE OF ABSCESS, FOOT (SUPPURATIVEHI OR SUBCUTANEOUS ABSCESS, CYST, FURUNCLE, ORPARONYCHIA); COMPLICATED OR MULTIPLE;  Surgeon: Runell Dale Bong, DPM;  Location: [facility] OR;  Service: Podiatry;  Laterality: Right;  . DEBRIDEMENT SKIN/SUBCUTANEOUS TISSUE MUSCLE & BONE FEMUR Right 09/15/2017   Procedure: DEBRIDEMENT, LEG; BONE (INCLUDES EPIDERMIS, DERMIS, SUBCUTANEOUS TISSUE,MUSCLE AND/OR FASCIA, IF PERFORMED); FIRST 20 SQ CM OR LESS;  Surgeon: Runell Dale Bong, DPM;  Location: [facility];  Service: Podiatry;  Laterality: Right;  . RECTAL EUS N/A 03/16/2018   Procedure: Lower Endoscopic Ultrasound;  Surgeon: Elta Fonda Mt, MD;  Location: [facility];  Service: Gastroenterology;  Laterality: N/A;  . PROCTECTOMY W/COLOSTOMY ABDOMINOPERINEAL N/A 08/17/2018   Procedure: Open abdominoperineal resection;  Surgeon: Florie Lonni Centers, MD;  Location: [facility];  Service: General Surgery;  Laterality: N/A;  . PR MUSCLE-SKIN FLAP,TRUNK N/A 08/17/2018   Procedure: MUSCLE, MYOCUTANEOUS, OR FASCIOCUTANEOUS FLAP; GENITALIA/PERINEUM;  Surgeon: Georgie Cella, MD;  Location: [facility];  Service: Plastic Surgery;  Laterality: N/A;  . CYSTOURETHROSCOPY W/URETERAL CATHIZATION W/WO RETROGRADE PYELOGRAM Bilateral 08/17/2018   Procedure: CYSTOURETHROSCOPY, WITH URETERAL CATHETERIZATION;  Surgeon: Lida Francis Dawn, MD;  Location: [facility];  Service: Urology;  Laterality: Bilateral;  . OPEN REDUCTION TIBIAL FRACTURE PROXIMAL Left 05/28/2020   Procedure: OPEN TREATMENT OF TIBIAL FRACTURE, PROXIMAL (PLATEAU); UNICONDYLAR, INCLUDES INTERNAL FIXATION, WHEN PERFORMED;  Surgeon: Sigurd Vernell Shuck, MD;  Location: [facility];  Service: Orthopedics;  Laterality: Left;  . colon resection for rectal ca - 12/2014    . Knee surgery     Staph infection - at age 61  . nose surgery trauma      SOCIAL HISTORY:    Social History   Socioeconomic History  . Marital status: Divorced  Tobacco Use  . Smoking status: Former    Current packs/day: 0.25    Average packs/day: 0.3 packs/day for 38.0 years (9.5 ttl pk-yrs)    Types: Cigarettes  . Smokeless tobacco: Never  . Tobacco comments:    Quit smoking 12/2014  Vaping Use  . Vaping status: Former  Substance and Sexual Activity  . Alcohol use: No  . Drug use: No    Comment: (Drug use in 20s). Marijuana. Last use: 2018.    [name] Sexual activity: Not Currently   Social Determinants of Health   Financial Resource Strain: Medium Risk (05/25/2020)   Received from [facility], [facility]   Overall Financial Resource Strain (CARDIA)   . Difficulty of Paying Living Expenses: Somewhat hard  Food Insecurity: No Food Insecurity (05/25/2020)   Received from [facility], [facility]   Hunger Vital Sign   . Worried About Programme researcher, broadcasting/film/video in the Last Year: Never true   . Ran Out of Food in the Last Year: Never true  Transportation Needs: No Transportation Needs (05/29/2020)   PRAPARE - Transportation   . Lack of Transportation (Medical): No   . Lack of Transportation (Non-Medical): No  Physical Activity: Insufficiently Active (05/25/2020)   Received from [facility], [facility]   Exercise Vital Sign   . Days of Exercise per Week: 3 days   . Minutes of Exercise per Session: 10 min  Stress: No Stress Concern Present (05/25/2020)   Received from [facility], [facility] - Occupational Stress Questionnaire   . Feeling of Stress : Only a little   Received from [employer], [facility]   Social Network  Housing Stability: High Risk (05/25/2020)   Received from [facility], [facility]   Housing Stability Vital Sign   . Unable to Pay for Housing in the Last Year: Yes   . In the last 12 months, was there a time when you did not have a steady place to sleep or slept in a shelter (including  now)?: No      FAMILY HISTORY:   Family History  Problem Relation Name Age of Onset  . Cancer Mother         ovarian ca  . Diabetes Father    . Diabetes Sister    . No Known Problems Brother 1   . Thyroid disease Sister    . No Known Problems Sister    . No Known Problems Daughter    . Testicular cancer Son  44       Dx in Nov 2016  . Cancer Paternal Grandmother  64       colon ca  . Colon cancer Maternal Grandmother    . Anesthesia problems Neg Hx       ALLERGIES:   No Known Allergies  CURRENT MEDICATIONS:   Current Outpatient Medications on File Prior to Visit  Medication Sig Dispense Refill  . blood glucose diagnostic test strip Use 1 each (1 strip total) 3 (three) times daily Product selection permitted according to insurance preference. e11.65 100 each 0  . gabapentin  (NEURONTIN )  100 MG capsule Take 1 capsule (100 mg total) by mouth every 8 (eight) hours (Patient not taking: Reported on 09/23/2022) 90 capsule 0  . insulin  LISPRO PROTAMINE-LISPRO (HUMALOG  KWIKPEN MIX 75/25) 100 unit/mL (75-25) pen injector Inject 25 Units subcutaneously 2 (two) times daily before meals 15 mL 0  . lancing device with lancets kit Use 1 each 3 (three) times daily Product selection permitted according to insurance preference. E11.65 T2 dm 100 each 0  . LANTUS  SOLOSTAR U-100 INSULIN  pen injector (concentration 100 units/mL) Inject 12 Units subcutaneously once daily    . losartan  (COZAAR ) 50 MG tablet Take 1 tablet (50 mg total) by mouth once daily 90 tablet 4  . pen needle, diabetic 31 gauge x 3/16 needle Use 2 (two) times daily before meals 100 each 0  . rosuvastatin  (CRESTOR ) 20 MG tablet Take 20 mg by mouth nightly    . tamsulosin  (FLOMAX ) 0.4 mg capsule Take by mouth once daily    . TORsemide (DEMADEX) 10 MG tablet Take 20 mg by mouth once daily     No current facility-administered medications on file prior to visit.      REVIEW OF SYSTEMS:   A 10-point review systems was reviewed with  the patient and is negative except for positives as reported in the history of present illness and as related to the above chronic conditions.  PHYSICAL EXAM: There were no vitals filed for this visit. Constitutional: The patient is in no acute distress. HEENT: Normocephalic and atraumatic. No conjunctival pallor or icterus. EOMI.  Cardiovascular: Regular rate and rhythm, +S1, +S2, with no murmurs, gallops, or rubs appreciated.  1+ nonpitting bilateral lower extremity edema. Pulmonary: Clear to auscultation bilaterally. No wheezes, rales, or rhonchi appreciated. Good respiratory effort. Gastrointestinal: Soft, nontender, nondistended, with normoactive bowel sounds present. LLQ ostomy present, with formed brown stool.  No masses or hepatosplenomegaly appreciated. Musculoskeletal: No clubbing or cyanosis. No joint swelling. Skin: No rashes present. Neurologic: No focal deficits.  Strength is 5/5 in all four extremities. Psychiatric: Unremarkable mood and affect.  Hematological/Lymphatic: No palpable cervical, supraclavicular, axillary adenopathy.  LABORATORY STUDIES:  Results for orders placed or performed in visit on 10/17/22  Comprehensive Metabolic Panel (CMP)  Result Value Ref Range   Sodium 136 135 - 145 mmol/L   Potassium 3.9 3.5 - 5.0 mmol/L   Chloride 103 98 - 108 mmol/L   Carbon Dioxide (CO2) 24 21 - 30 mmol/L   Urea Nitrogen (BUN) 27 (H) 7 - 20 mg/dL   Creatinine 2.0 (H) 0.6 - 1.3 mg/dL   Glucose 797 (H) 70 - 140 mg/dL   Calcium  9.0 8.7 - 10.2 mg/dL   AST (Aspartate Aminotransferase) 24 15 - 41 U/L   ALT (Alanine Aminotransferase) 18 15 - 50 U/L   Bilirubin, Total 0.6 0.4 - 1.5 mg/dL   Alk Phos (Alkaline Phosphatase) 76 24 - 110 U/L   Albumin 3.2 (L) 3.5 - 4.8 g/dL   Protein, Total 5.9 (L) 6.2 - 8.1 g/dL   Anion Gap 9 3 - 12 mmol/L   BUN/CREA Ratio 14 6 - 27   Glomerular Filtration Rate (eGFR)  38 mL/min/1.73sq m  Complete Blood Count (CBC) with Differential  Result  Value Ref Range   WBC (White Blood Cell Count) 4.7 3.2 - 9.8 x10^9/L   Hemoglobin 11.8 (L) 13.7 - 17.3 g/dL   Hematocrit 64.1 (L) 60.9 - 49.0 %   Platelets 177 150 - 450 x10^9/L   MCV (Mean Corpuscular Volume) 87 80 -  98 fL   MCH (Mean Corpuscular Hemoglobin) 28.7 26.5 - 34.0 pg   MCHC (Mean Corpuscular Hemoglobin Concentration) 33.0 31.5 - 36.3 %   RBC (Red Blood Cell Count) 4.11 (L) 4.37 - 5.74 x10^12/L   RDW-CV (Red Cell Distribution Width) 13.3 11.5 - 14.5 %   NRBC (Nucleated Red Blood Cell Count) 0.00 0 x10^9/L   NRBC % (Nucleated Red Blood Cell %) 0.0 %   MPV (Mean Platelet Volume) 10.8 7.2 - 11.7 fL   Neutrophil Count 3.2 2.0 - 8.6 x10^9/L   Neutrophil % 68.6 37 - 80 %   Lymphocyte Count 0.8 0.6 - 4.2 x10^9/L   Lymphocyte % 17.7 10 - 50 %   Monocyte Count 0.4 0 - 0.9 x10^9/L   Monocyte % 7.9 0 - 12 %   Eosinophil Count 0.21 0 - 0.70 x10^9/L   Eosinophil % 4.5 0 - 7 %   Basophil Count 0.04 0 - 0.20 x10^9/L   Basophil % 0.9 0 - 2 %   Immature Granulocyte Count 0.02 <=0.06 x10^9/L   Immature Granulocyte % 0.4 <=0.7 %  Carcinoembryonic Antigen (CEA)  Result Value Ref Range   CEA (Carcinoembryonic Antigen) 2.2 <=2.5 ng/mL   Narrative   INTERPRETIVE DATA                                                                                                                                                 Results obtained by [employer].                The measured value of this analyte can vary depending upon the                 testing procedure used.  Values determined on patient samples                  by differing testing procedures cannot be directly compared with               one another, and could be cause of erroneous medical                           interpretation.        RADIOLOGIC STUDIES:  Results for orders placed during the hospital encounter of 10/14/22  CT ABDOMEN PELVIS WITH CONTRAST  Narrative CT ABDOMEN PELVIS WITH CONTRAST   10/14/2022 9:27 AM  Signs and Symptoms/Comments: Rectal cancer, assess treatment response, 57 yo M with rectal cancer, C20 Malignant neoplasm of rectum (CMS/HHS-HCC)  Technique: CT of the abdomen and pelvis was performed following the administration of intravenous contrast. This CT used either dose modulation and/or iterative reconstruction techniques to lower radiation dose.  Comparison: Multiple prior CT abdomen pelvis exams with the most recent performed 02/13/2019. Pelvis MRI 07/23/2018  Findings: Lower  chest: Small pericardial effusion.  Liver: The liver enhances homogeneously. No suspicious hepatic lesion is identified.  Gallbladder: No significant finding.  Bile ducts: Normal in caliber.  Spleen: Redemonstrated splenic cysts.  Pancreas: No suspicious lesion or pancreatic ductal dilatation.  Adrenal glands: No significant finding.  Kidneys, ureters, bladder: The kidneys enhance symmetrically. There is no nephrolithiasis, hydronephrosis, or suspicious lesion. The urinary bladder is unremarkable.  Reproductive organs: No significant finding.  Bowel: Prior abdominoperineal resection with a left lower quadrant colostomy. No evidence of acute inflammation or obstruction. No visible mass.  Peritoneal cavity: No free air or drainable fluid. There is nonspecific soft tissue attenuation within the presacral space roughly measuring 6.0 x 2.2 cm.  Lymph nodes: No lymphadenopathy.  Vascular: Scattered atherosclerotic calcific changes without evidence of aneurysm.  Abdominal wall: Left lower quadrant colostomy  Musculoskeletal: No acute fracture or suspicious osseous lesion. Scarring in the subcutaneous tissues overlying the left gluteal musculature at the site of previously seen fluid collection.  Scout: No additional abnormalities.  Impression:  1. Presacral soft tissue density presumably represents scar tissue given previously seen abscess at this location, though  attention on follow-up is recommended given lack of interval studies since 02/13/2019. 2. No visible mass, abnormal bowel wall thickening, or lymphadenopathy in the abdomen pelvis. 3. Small pericardial effusion.    02/13/2019 CT abdomen pelvis with contrast:  CT abdomen and pelvis with IV contrast   Comparison:  01/01/2019.   Findings:  The lung bases are clear. Note is made of a small pericardial effusion.   The liver, adrenals, kidneys, gallbladder, and pancreas are unremarkable. Several cysts are noted within the spleen, unchanged from prior.   A left lower quadrant colostomy is noted. Note is made of steatorrhea. The bowel is normal in caliber. The patient is status post APR. A persistent fluid collection is noted within the presacral space where a drainage catheter has been removed. The collection measures 3.6 x 1.7 cm in size in its presacral measurements and contains a left lateral component that tracks down to the skin inferiorly along the inferior gluteal cleft in the area of the prior myocutaneous flap, along the course of the catheter placed in October 2020. Inflammatory changes are noted all along the course of the prior catheter tract of the catheter placed in November 2020.   Atherosclerotic calcification is noted about the aorta and its branch vessels. No aggressive osseous lesions are identified.   Impression: Interval reaccumulation of small presacral fluid collection with measurements as above. The left lateral inferior component is new from most recent prior and extends to the inferior gluteal cleft.      ASSESSMENT/RECOMMENDATIONS:  56 year old gentleman with history of CKD3, HTN, HLD, DM 2, rectal cancer with recurrence, status post concurrent chemo RT and salvage APR (08/2018), with anemia, presenting for clinic follow up visit.     History of rectal cancer: Status post neoadjuvant therapy for rectal cancer followed by LAR at an OSH for a low lying  rectal cancer at his dentate line.  No cancer was found in that specimen.  Found to have recurrence in 02/2018, s/p course of concurrent chemoradiation with capecitabine (4/15-5/19/20.) He underwent APR on 08/17/2018.  The patient has been lost to follow-up since May 2020 in the medical oncology clinic and returned in 09/2022.  09/23/2022 CEA 2.5  In 10/14/2022, clinical exam, labs and most recent imaging reveal no findings concerning for recurrence at this time.    I have discussed this with the surgery team,  who feels comfortable with the patient returning to his PCP.  Anemia: Chronic, likely with a contributory component of CKD and anemia of chronic disease/inflammation.    Labs in 09/2022 revealed normal iron studies, B12, negative SPEP/immunofixation, no evidence of hemolysis, elevated ESR (inflammatory marker), inappropriately normal reticulocytes.  Hb continues to trend up, 11.8 today.  We will continue to monitor. If indicated, will consider bone marrow biopsy for further evaluation, given inappropriately normal reticulocytes.  CKD Stage 3:  Previously followed by nephrology, last seen in 03/2020.  His PCP manages this, per patient.   DM 2: Poorly controlled.  HbA1c 11.2 on 08/05/2022.  To continue management with his PCP.  The patient expresses that he has significant financial considerations and would like to return to his PCP to monitor his blood counts.  He is welcome to return at any point with any any questions or concerns.  He will return to clinic as needed, with further evaluation as indicated.   The patient's previous medical records were reviewed and summarized above. The above plan was discussed with the patient, and questions were answered in full.   I spent a total of 40 minutes in both face-to-face and non-face-to-face activities for this visit on the date of this encounter, of which more than 50% was spent in counseling regarding multiple diagnoses and clinical concerns,  monitoring patient's labs for potential toxicity due to high risk medications, evaluative/treatment/management options, potential side effects, reviewing records and scans, coordination of care, multiple communications with subspecialists, nursing, pharmacy and infusion center, and completing the medical record.      ROLLENE BARRIO, MD   Medical Oncology/Hematology [facility]

## 2023-01-28 ENCOUNTER — Emergency Department: Payer: Medicare Other

## 2023-01-28 ENCOUNTER — Encounter: Payer: Self-pay | Admitting: Pharmacy Technician

## 2023-01-28 ENCOUNTER — Inpatient Hospital Stay: Payer: Medicare Other

## 2023-01-28 ENCOUNTER — Inpatient Hospital Stay
Admission: EM | Admit: 2023-01-28 | Discharge: 2023-02-02 | DRG: 872 | Disposition: A | Discharge status: Home Health | Payer: Medicare Other | Attending: Internal Medicine | Admitting: Internal Medicine

## 2023-01-28 ENCOUNTER — Other Ambulatory Visit: Payer: Self-pay

## 2023-01-28 DIAGNOSIS — E1122 Type 2 diabetes mellitus with diabetic chronic kidney disease: Secondary | ICD-10-CM | POA: Diagnosis present

## 2023-01-28 DIAGNOSIS — I10 Essential (primary) hypertension: Secondary | ICD-10-CM

## 2023-01-28 DIAGNOSIS — E876 Hypokalemia: Secondary | ICD-10-CM | POA: Diagnosis present

## 2023-01-28 DIAGNOSIS — S91331A Puncture wound without foreign body, right foot, initial encounter: Secondary | ICD-10-CM | POA: Diagnosis present

## 2023-01-28 DIAGNOSIS — L02612 Cutaneous abscess of left foot: Principal | ICD-10-CM | POA: Diagnosis present

## 2023-01-28 DIAGNOSIS — Z933 Colostomy status: Secondary | ICD-10-CM | POA: Diagnosis not present

## 2023-01-28 DIAGNOSIS — L03116 Cellulitis of left lower limb: Secondary | ICD-10-CM | POA: Diagnosis present

## 2023-01-28 DIAGNOSIS — E1152 Type 2 diabetes mellitus with diabetic peripheral angiopathy with gangrene: Secondary | ICD-10-CM | POA: Diagnosis present

## 2023-01-28 DIAGNOSIS — Z9049 Acquired absence of other specified parts of digestive tract: Secondary | ICD-10-CM

## 2023-01-28 DIAGNOSIS — E11621 Type 2 diabetes mellitus with foot ulcer: Secondary | ICD-10-CM | POA: Diagnosis present

## 2023-01-28 DIAGNOSIS — E66811 Obesity, class 1: Secondary | ICD-10-CM

## 2023-01-28 DIAGNOSIS — Z794 Long term (current) use of insulin: Secondary | ICD-10-CM | POA: Diagnosis not present

## 2023-01-28 DIAGNOSIS — L089 Local infection of the skin and subcutaneous tissue, unspecified: Secondary | ICD-10-CM | POA: Diagnosis not present

## 2023-01-28 DIAGNOSIS — E1140 Type 2 diabetes mellitus with diabetic neuropathy, unspecified: Secondary | ICD-10-CM

## 2023-01-28 DIAGNOSIS — B9561 Methicillin susceptible Staphylococcus aureus infection as the cause of diseases classified elsewhere: Secondary | ICD-10-CM | POA: Diagnosis present

## 2023-01-28 DIAGNOSIS — D631 Anemia in chronic kidney disease: Secondary | ICD-10-CM | POA: Diagnosis present

## 2023-01-28 DIAGNOSIS — N2581 Secondary hyperparathyroidism of renal origin: Secondary | ICD-10-CM

## 2023-01-28 DIAGNOSIS — E1142 Type 2 diabetes mellitus with diabetic polyneuropathy: Secondary | ICD-10-CM | POA: Diagnosis present

## 2023-01-28 DIAGNOSIS — E871 Hypo-osmolality and hyponatremia: Secondary | ICD-10-CM | POA: Diagnosis present

## 2023-01-28 DIAGNOSIS — E11628 Type 2 diabetes mellitus with other skin complications: Secondary | ICD-10-CM | POA: Insufficient documentation

## 2023-01-28 DIAGNOSIS — N400 Enlarged prostate without lower urinary tract symptoms: Secondary | ICD-10-CM | POA: Diagnosis present

## 2023-01-28 DIAGNOSIS — D638 Anemia in other chronic diseases classified elsewhere: Secondary | ICD-10-CM | POA: Insufficient documentation

## 2023-01-28 DIAGNOSIS — E1342 Other specified diabetes mellitus with diabetic polyneuropathy: Secondary | ICD-10-CM | POA: Diagnosis not present

## 2023-01-28 DIAGNOSIS — N179 Acute kidney failure, unspecified: Secondary | ICD-10-CM

## 2023-01-28 DIAGNOSIS — L97529 Non-pressure chronic ulcer of other part of left foot with unspecified severity: Secondary | ICD-10-CM | POA: Diagnosis present

## 2023-01-28 DIAGNOSIS — E1165 Type 2 diabetes mellitus with hyperglycemia: Secondary | ICD-10-CM | POA: Diagnosis present

## 2023-01-28 DIAGNOSIS — Z85038 Personal history of other malignant neoplasm of large intestine: Secondary | ICD-10-CM

## 2023-01-28 DIAGNOSIS — L03119 Cellulitis of unspecified part of limb: Secondary | ICD-10-CM | POA: Diagnosis not present

## 2023-01-28 DIAGNOSIS — Z1152 Encounter for screening for COVID-19: Secondary | ICD-10-CM

## 2023-01-28 DIAGNOSIS — B961 Klebsiella pneumoniae [K. pneumoniae] as the cause of diseases classified elsewhere: Secondary | ICD-10-CM | POA: Diagnosis present

## 2023-01-28 DIAGNOSIS — E785 Hyperlipidemia, unspecified: Secondary | ICD-10-CM | POA: Diagnosis present

## 2023-01-28 DIAGNOSIS — E1169 Type 2 diabetes mellitus with other specified complication: Secondary | ICD-10-CM

## 2023-01-28 DIAGNOSIS — I129 Hypertensive chronic kidney disease with stage 1 through stage 4 chronic kidney disease, or unspecified chronic kidney disease: Secondary | ICD-10-CM | POA: Diagnosis present

## 2023-01-28 DIAGNOSIS — Z79899 Other long term (current) drug therapy: Secondary | ICD-10-CM

## 2023-01-28 DIAGNOSIS — N1832 Chronic kidney disease, stage 3b: Secondary | ICD-10-CM | POA: Diagnosis present

## 2023-01-28 DIAGNOSIS — A419 Sepsis, unspecified organism: Secondary | ICD-10-CM | POA: Diagnosis not present

## 2023-01-28 DIAGNOSIS — L0390 Cellulitis, unspecified: Secondary | ICD-10-CM | POA: Diagnosis present

## 2023-01-28 DIAGNOSIS — Z87891 Personal history of nicotine dependence: Secondary | ICD-10-CM

## 2023-01-28 DIAGNOSIS — L02619 Cutaneous abscess of unspecified foot: Secondary | ICD-10-CM | POA: Diagnosis not present

## 2023-01-28 DIAGNOSIS — N170 Acute kidney failure with tubular necrosis: Secondary | ICD-10-CM | POA: Diagnosis not present

## 2023-01-28 DIAGNOSIS — Z6831 Body mass index (BMI) 31.0-31.9, adult: Secondary | ICD-10-CM

## 2023-01-28 DIAGNOSIS — D649 Anemia, unspecified: Secondary | ICD-10-CM | POA: Insufficient documentation

## 2023-01-28 LAB — RESP PANEL BY RT-PCR (RSV, FLU A&B, COVID)  RVPGX2
Influenza A by PCR: NEGATIVE
Influenza B by PCR: NEGATIVE
Resp Syncytial Virus by PCR: NEGATIVE
SARS Coronavirus 2 by RT PCR: NEGATIVE

## 2023-01-28 LAB — CBC
HCT: 34.3 % — ABNORMAL LOW (ref 39.0–52.0)
Hemoglobin: 11.6 g/dL — ABNORMAL LOW (ref 13.0–17.0)
MCH: 29.2 pg (ref 26.0–34.0)
MCHC: 33.8 g/dL (ref 30.0–36.0)
MCV: 86.4 fL (ref 80.0–100.0)
Platelets: 157 10*3/uL (ref 150–400)
RBC: 3.97 MIL/uL — ABNORMAL LOW (ref 4.22–5.81)
RDW: 12.6 % (ref 11.5–15.5)
WBC: 13.2 10*3/uL — ABNORMAL HIGH (ref 4.0–10.5)
nRBC: 0 % (ref 0.0–0.2)

## 2023-01-28 LAB — CBG MONITORING, ED: Glucose-Capillary: 341 mg/dL — ABNORMAL HIGH (ref 70–99)

## 2023-01-28 LAB — COMPREHENSIVE METABOLIC PANEL
ALT: 11 U/L (ref 0–44)
AST: 21 U/L (ref 15–41)
Albumin: 3.1 g/dL — ABNORMAL LOW (ref 3.5–5.0)
Alkaline Phosphatase: 76 U/L (ref 38–126)
Anion gap: 11 (ref 5–15)
BUN: 35 mg/dL — ABNORMAL HIGH (ref 6–20)
CO2: 26 mmol/L (ref 22–32)
Calcium: 8.9 mg/dL (ref 8.9–10.3)
Chloride: 95 mmol/L — ABNORMAL LOW (ref 98–111)
Creatinine, Ser: 2.3 mg/dL — ABNORMAL HIGH (ref 0.61–1.24)
GFR, Estimated: 32 mL/min — ABNORMAL LOW (ref >60)
Glucose, Bld: 347 mg/dL — ABNORMAL HIGH (ref 70–99)
Potassium: 3.7 mmol/L (ref 3.5–5.1)
Sodium: 132 mmol/L — ABNORMAL LOW (ref 135–145)
Total Bilirubin: 0.8 mg/dL (ref <1.2)
Total Protein: 6.5 g/dL (ref 6.5–8.1)

## 2023-01-28 LAB — LIPASE, BLOOD: Lipase: 27 U/L (ref 11–51)

## 2023-01-28 LAB — LACTIC ACID, PLASMA: Lactic Acid, Venous: 1.8 mmol/L (ref 0.5–1.9)

## 2023-01-28 MED ORDER — INSULIN GLARGINE-YFGN 100 UNIT/ML ~~LOC~~ SOLN
12.0000 [IU] | Freq: Every day | SUBCUTANEOUS | Status: DC
  Administered 2023-01-29: 12 [IU] via SUBCUTANEOUS
  Filled 2023-01-28 (×2): qty 0.12

## 2023-01-28 MED ORDER — INSULIN ASPART 100 UNIT/ML IJ SOLN
10.0000 [IU] | Freq: Once | INTRAMUSCULAR | Status: DC
  Filled 2023-01-28: qty 1

## 2023-01-28 MED ORDER — INSULIN ASPART 100 UNIT/ML IJ SOLN
0.0000 [IU] | Freq: Every day | INTRAMUSCULAR | Status: DC
  Administered 2023-01-29: 2 [IU] via SUBCUTANEOUS
  Filled 2023-01-28: qty 1

## 2023-01-28 MED ORDER — INSULIN ASPART 100 UNIT/ML IJ SOLN
0.0000 [IU] | Freq: Three times a day (TID) | INTRAMUSCULAR | Status: DC
Start: 2023-01-29 — End: 2023-02-01
  Administered 2023-01-29 – 2023-01-30 (×4): 5 [IU] via SUBCUTANEOUS
  Administered 2023-01-30: 8 [IU] via SUBCUTANEOUS
  Administered 2023-01-30 – 2023-01-31 (×2): 3 [IU] via SUBCUTANEOUS
  Administered 2023-01-31 – 2023-02-01 (×3): 5 [IU] via SUBCUTANEOUS
  Administered 2023-02-01: 3 [IU] via SUBCUTANEOUS
  Filled 2023-01-28 (×10): qty 1

## 2023-01-28 MED ORDER — LACTATED RINGERS IV SOLN: INTRAVENOUS | Status: AC

## 2023-01-28 MED ORDER — SODIUM CHLORIDE 0.9 % IV SOLN
2.0000 g | Freq: Once | INTRAVENOUS | Status: AC
  Administered 2023-01-28: 2 g via INTRAVENOUS
  Filled 2023-01-28: qty 12.5

## 2023-01-28 MED ORDER — ONDANSETRON 4 MG PO TBDP
4.0000 mg | ORAL_TABLET | Freq: Once | ORAL | Status: AC
  Administered 2023-01-28: 4 mg via ORAL
  Filled 2023-01-28: qty 1

## 2023-01-28 MED ORDER — VANCOMYCIN HCL IN DEXTROSE 1-5 GM/200ML-% IV SOLN
1000.0000 mg | Freq: Once | INTRAVENOUS | Status: AC
  Administered 2023-01-28: 1000 mg via INTRAVENOUS
  Filled 2023-01-28: qty 200

## 2023-01-28 MED ORDER — HYDROMORPHONE HCL 1 MG/ML IJ SOLN
1.0000 mg | INTRAMUSCULAR | Status: DC | PRN
  Administered 2023-02-01: 1 mg via INTRAVENOUS
  Filled 2023-01-28: qty 1

## 2023-01-28 MED ORDER — INSULIN ASPART 100 UNIT/ML IJ SOLN
10.0000 [IU] | Freq: Once | INTRAMUSCULAR | Status: AC
  Administered 2023-01-28: 10 [IU] via SUBCUTANEOUS

## 2023-01-28 MED ORDER — IOHEXOL 300 MG/ML  SOLN
75.0000 mL | Freq: Once | INTRAMUSCULAR | Status: AC | PRN
  Administered 2023-01-28: 75 mL via INTRAVENOUS

## 2023-01-28 MED ORDER — METRONIDAZOLE 500 MG/100ML IV SOLN
500.0000 mg | Freq: Once | INTRAVENOUS | Status: AC
  Administered 2023-01-28: 500 mg via INTRAVENOUS
  Filled 2023-01-28: qty 100

## 2023-01-28 MED ORDER — ONDANSETRON HCL 4 MG/2ML IJ SOLN
4.0000 mg | Freq: Four times a day (QID) | INTRAMUSCULAR | Status: DC | PRN
  Administered 2023-01-28 – 2023-02-01 (×2): 4 mg via INTRAVENOUS
  Filled 2023-01-28 (×2): qty 2

## 2023-01-28 MED ORDER — OXYCODONE HCL 5 MG PO TABS: 5.0000 mg | ORAL_TABLET | ORAL | Status: DC | PRN

## 2023-01-28 MED ORDER — SODIUM CHLORIDE 0.9 % IV BOLUS
1000.0000 mL | Freq: Once | INTRAVENOUS | Status: AC
  Administered 2023-01-28: 1000 mL via INTRAVENOUS

## 2023-01-28 NOTE — Consult Note (Signed)
ED Pharmacy Antibiotic Sign Off An antibiotic consult was received from an ED provider for Vancomycin and cefepime per pharmacy dosing for cellulitis. A chart review was completed to assess appropriateness.   The following one time order(s) were placed:  Cefepime 2gm IV Provider entered an auto verified order for Vancomycin x 1 dose. No weight or high in epic yet. RN aware and will update record ASAP.  Further antibiotic and/or antibiotic pharmacy consults should be ordered by the admitting provider if indicated.   Thank you for allowing pharmacy to be a part of this patient's care.   Morgan Keinath Rodriguez-Guzman PharmD, BCPS 01/28/2023 8:57 PM

## 2023-01-28 NOTE — ED Triage Notes (Signed)
Pt here with reports of vomiting onset Tuesday. States has not been able to tolerate any PO. Also complains of redness, swelling to LLE. LLE warm to the touch.Reports noticed pain to LLE yesterday.

## 2023-01-28 NOTE — ED Provider Notes (Signed)
[facility] Provider Note    Event Date/Time   First MD Initiated Contact with Patient 01/28/23 2019     (approximate)   History   Emesis   HPI  Jason Jennings is a 57 y.o. male history of diabetes as well as colon cancer status post colectomy with ostomy, neuropathy, presents to the ER for evaluation of several days of general malaise nausea vomiting as well as some redness and discoloration of his left foot and right flank pain.     Physical Exam   Triage Vital Signs: ED Triage Vitals  Encounter Vitals Group     BP 01/28/23 1632 (!) 124/90     Systolic BP Percentile --      Diastolic BP Percentile --      Pulse Rate 01/28/23 1632 (!) 108     Resp 01/28/23 1632 18     Temp 01/28/23 1632 100.3 F (37.9 C)     Temp src --      SpO2 01/28/23 1632 100 %     Weight --      Height --      Head Circumference --      Peak Flow --      Pain Score 01/28/23 1633 7     Pain Loc --      Pain Education --      Exclude from Growth Chart --     Most recent vital signs: Vitals:   01/28/23 2000 01/28/23 2030  BP: (!) 150/89 (!) 150/89  Pulse: (!) 111 (!) 114  Resp: 18 18  Temp:    SpO2: 99% 99%     Constitutional: Alert  Eyes: Conjunctivae are normal.  Head: Atraumatic. Nose: No congestion/rhinnorhea. Mouth/Throat: Mucous membranes are moist.   Neck: Painless ROM.  Cardiovascular:   Good peripheral circulation. Respiratory: Normal respiratory effort.  No retractions.  Gastrointestinal: Soft and nontender.  Musculoskeletal: Significant swelling and redness to the left lower extremity.  On the ball of the left foot there is a metallic object consistent with the clip of overall pants embedded in the foot and breaking soft tissue with some surrounding purulence this was easily removed. Neurologic:  MAE spontaneously. No gross focal neurologic deficits are appreciated.  Skin:  Skin is warm, dry and intact. No rash noted. Psychiatric: Mood and  affect are normal. Speech and behavior are normal.    ED Results / Procedures / Treatments   Labs (all labs ordered are listed, but only abnormal results are displayed) Labs Reviewed  COMPREHENSIVE METABOLIC PANEL - Abnormal; Notable for the following components:      Result Value   Sodium 132 (*)    Chloride 95 (*)    Glucose, Bld 347 (*)    BUN 35 (*)    Creatinine, Ser 2.30 (*)    Albumin 3.1 (*)    GFR, Estimated 32 (*)    All other components within normal limits  CBC - Abnormal; Notable for the following components:   WBC 13.2 (*)    RBC 3.97 (*)    Hemoglobin 11.6 (*)    HCT 34.3 (*)    All other components within normal limits  CBG MONITORING, ED - Abnormal; Notable for the following components:   Glucose-Capillary 341 (*)    All other components within normal limits  RESP PANEL BY RT-PCR (RSV, FLU A&B, COVID)  RVPGX2  LIPASE, BLOOD  LACTIC ACID, PLASMA  URINALYSIS, ROUTINE W REFLEX MICROSCOPIC     EKG  RADIOLOGY Please see ED Course for my review and interpretation.  I personally reviewed all radiographic images ordered to evaluate for the above acute complaints and reviewed radiology reports and findings.  These findings were personally discussed with the patient.  Please see medical record for radiology report.    PROCEDURES:  Critical Care performed: Yes, see critical care procedure note(s)  .Critical Care  Performed by: Willy Eddy, MD Authorized by: Willy Eddy, MD   Critical care provider statement:    Critical care time (minutes):  40   Critical care was necessary to treat or prevent imminent or life-threatening deterioration of the following conditions:  Sepsis   Critical care was time spent personally by me on the following activities:  Ordering and performing treatments and interventions, ordering and review of laboratory studies, ordering and review of radiographic studies, pulse oximetry, re-evaluation of patient's  condition, review of old charts, obtaining history from patient or surrogate, examination of patient, evaluation of patient's response to treatment, discussions with primary provider, discussions with consultants and development of treatment plan with patient or surrogate    MEDICATIONS ORDERED IN ED: Medications  metroNIDAZOLE (FLAGYL) IVPB 500 mg (500 mg Intravenous New Bag/Given 01/28/23 2059)  vancomycin (VANCOCIN) IVPB 1000 mg/200 mL premix (has no administration in time range)  ondansetron (ZOFRAN-ODT) disintegrating tablet 4 mg (4 mg Oral Given 01/28/23 1931)  ceFEPIme (MAXIPIME) 2 g in sodium chloride 0.9 % 100 mL IVPB (0 g Intravenous Stopped 01/28/23 2138)  sodium chloride 0.9 % bolus 1,000 mL (1,000 mLs Intravenous New Bag/Given 01/28/23 2106)  iohexol (OMNIPAQUE) 300 MG/ML solution 75 mL (75 mLs Intravenous Contrast Given 01/28/23 2118)  insulin aspart (novoLOG) injection 10 Units (10 Units Subcutaneous Given 01/28/23 2135)     IMPRESSION / MDM / ASSESSMENT AND PLAN / ED COURSE  I reviewed the triage vital signs and the nursing notes.                              Differential diagnosis includes, but is not limited to, sepsis, foreign body, abscess, cellulitis  Patient presenting to the ER for evaluation of symptoms as described above.  Based on symptoms, risk factors and considered above differential, this presenting complaint could reflect a potentially life-threatening illness therefore the patient will be placed on continuous pulse oximetry and telemetry for monitoring.  Laboratory evaluation will be sent to evaluate for the above complaints.  On examination patient has foreign body embedded in the ball of his left foot with surrounding cellulitis which likely be source of patient's sepsis have ordered IV antibiotics but patient also with complicated history with intra-abdominal abscesses and given his flank pain nausea vomiting will order CT to further evaluate.  Will give IV  fluids as well as insulin for hyperglycemia and sepsis.  Anticipate patient will require hospitalization.  Clinical Course as of 01/28/23 2139  Sat Jan 28, 2023  2138 CT imaging my review and interpretation without evidence of obstruction.  Patient receiving IV fluids as well as broad-spectrum antibiotics for left lower extremity cellulitis infection.  Will consult hospitalist for admission. [PR]    Clinical Course User Index [PR] Willy Eddy, MD     FINAL CLINICAL IMPRESSION(S) / ED DIAGNOSES   Final diagnoses:  Sepsis, due to unspecified organism, unspecified whether acute organ dysfunction present [facility])     Rx / DC Orders   ED Discharge Orders     None  Note:  This document was prepared using Dragon voice recognition software and may include unintentional dictation errors.    Willy Eddy, MD 01/28/23 2139

## 2023-01-28 NOTE — H&P (Signed)
PCP:   Rayburn Ma, MD   Chief Complaint:  Intractable nausea vomiting.  HPI: This is a 57 year old male with past medical history of T2DM, poorly controlled, severe peripheral neuropathy, HLD, HTN, BPH, colon cancer.  The patient reports persistent numbness in the feet and both feet.  The patient presents stating since Tuesday he has been unable to keep anything down due to persistent nausea and vomiting.  He endorses chills but denies fevers.  He denies lightheadedness, dizziness, abdominal pain, diarrhea.  Yesterday he noted his left lower extremity becoming hot to touch.  He denies cuts or abrasions in the lower extremity.  He came to the ER.  In the ER patient's vitals 124/90, 108, 18, Tmax 100.3.  Creatinine 2.3, baseline 2.1, WBC 13.2, glucose 345.Marland Kitchen  Examination of sole of the patient's left lower extremity was notable for metal clip stuck in the ball of the foot.  The patient denies pain.  He states he is unaware of when he stepped on the object.  Patient given IV cefepime and vancomycin.  Admission requested.  Review of Systems:  Per HPI  Past Medical History: Past Medical History:  Diagnosis Date   Cancer (HCC)    Diabetes mellitus without complication (HCC)    Neuropathy    Past Surgical History:  Procedure Laterality Date   COLON SURGERY      Medications: Prior to Admission medications   Medication Sig Start Date End Date Taking? Authorizing Provider  methylPREDNISolone (MEDROL DOSEPAK) 4 MG TBPK tablet Take Tapered dose as directed 06/29/20   Joni Reining, PA-C  ondansetron (ZOFRAN) 8 MG tablet Take 1 tablet (8 mg total) by mouth every 8 (eight) hours as needed for nausea or vomiting. 05/16/20   Cuthriell, Delorise Royals, PA-C      gabapentin (NEURONTIN) 100 MG capsule 3 times a day   insulin glargine (Lantus SoloStar) Inject 14 Units under the skin in the morning.   insulin lispro protamine-insulin lispro (HumaLOG 75-25) (75-25) 100 UNIT/ML inj pen Inject  30 Units under the skin 2 (two) times a day   losartan (COZAAR) 50 MG tablet Take 50 mg by mouth 1 (one) time each day   pravastatin (PRAVACHOL) 20 MG tablet Take 1 tablet by mouth 1 (one) time each day   rosuvastatin (CRESTOR) 20 MG  each day   tamsulosin  0.4 MG   torsemide (Demadex) 10 MG total) by mouth 1 each day   Dapagliflozin Propanediol 5 MG tablet Take 5 mg by mouth 1 (one) time each day I  losartan (COZAAR) 100 MG tablet 100 mg by oral route.     Allergies:  No Known Allergies  Social History:  reports that he quit smoking about 8 years ago. He has never used smokeless tobacco. He reports that he does not currently use alcohol. He reports that he does not currently use drugs after having used the following drugs: Marijuana.  Family History: History reviewed. No pertinent family history.  Physical Exam: Vitals:   01/28/23 2030 01/28/23 2100 01/28/23 2130 01/28/23 2200  BP: (!) 150/89  (!) 153/80 (!) 166/81  Pulse: (!) 114 (!) 108 (!) 129 (!) 106  Resp: 18  18 16   Temp:      SpO2: 99% 100% 98% 95%    General: A and O x 3, well developed and nourished, no acute distress Eyes: PERRLA, pink conjunctiva, no scleral icterus ENT: Moist oral mucosa, neck supple, no thyromegaly Lungs: CTA B/L, no wheeze, no crackles, no use  of accessory muscles Cardiovascular: RRR, no regurgitation, no gallops, no murmurs. No carotid bruits, no JVD Abdomen: soft, positive BS, left-sided colostomy bag, not an acute abdomen GU: not examined Neuro: CN II - XII grossly intact, 0 sensation bilateral lower extremity Musculoskeletal: LLE edema 2+, erythema sole of feet, ankle..  No bogginess to the ankle.  No issues with range of motion of ankle.  On the sole of patient's feet and necrotic outlined area where patient had a similar history metallic clip embedded. Skin: no rash, no subcutaneous crepitation, no decubitus Psych: appropriate patient  Labs on Admission:  Recent Labs     01/28/23 1635  NA 132*  K 3.7  CL 95*  CO2 26  GLUCOSE 347*  BUN 35*  CREATININE 2.30*  CALCIUM 8.9   Recent Labs    01/28/23 1635  AST 21  ALT 11  ALKPHOS 76  BILITOT 0.8  PROT 6.5  ALBUMIN 3.1*   Recent Labs    01/28/23 1635  LIPASE 27   Recent Labs    01/28/23 1635  WBC 13.2*  HGB 11.6*  HCT 34.3*  MCV 86.4  PLT 157    Micro Results: Recent Results (from the past 240 hours)  Resp panel by RT-PCR (RSV, Flu A&B, Covid) Anterior Nasal Swab     Status: None   Collection Time: 01/28/23  8:34 PM   Specimen: Anterior Nasal Swab  Result Value Ref Range Status   SARS Coronavirus 2 by RT PCR NEGATIVE NEGATIVE Final    Comment: (NOTE) SARS-CoV-2 target nucleic acids are NOT DETECTED.  The SARS-CoV-2 RNA is generally detectable in upper respiratory specimens during the acute phase of infection. The lowest concentration of SARS-CoV-2 viral copies this assay can detect is 138 copies/mL. A negative result does not preclude SARS-Cov-2 infection and should not be used as the sole basis for treatment or other patient management decisions. A negative result may occur with  improper specimen collection/handling, submission of specimen other than nasopharyngeal swab, presence of viral mutation(s) within the areas targeted by this assay, and inadequate number of viral copies(<138 copies/mL). A negative result must be combined with clinical observations, patient history, and epidemiological information. The expected result is Negative.  Fact Sheet for Patients:  BloggerCourse.com  Fact Sheet for Healthcare Providers:  SeriousBroker.it  This test is no t yet approved or cleared by the [place] FDA and  has been authorized for detection and/or diagnosis of SARS-CoV-2 by FDA under an Emergency Use Authorization (EUA). This EUA will remain  in effect (meaning this test can be used) for the duration of the COVID-19  declaration under Section 564(b)(1) of the Act, 21 U.S.C.section 360bbb-3(b)(1), unless the authorization is terminated  or revoked sooner.       Influenza A by PCR NEGATIVE NEGATIVE Final   Influenza B by PCR NEGATIVE NEGATIVE Final    Comment: (NOTE) The Xpert Xpress SARS-CoV-2/FLU/RSV plus assay is intended as an aid in the diagnosis of influenza from Nasopharyngeal swab specimens and should not be used as a sole basis for treatment. Nasal washings and aspirates are unacceptable for Xpert Xpress SARS-CoV-2/FLU/RSV testing.  Fact Sheet for Patients: BloggerCourse.com  Fact Sheet for Healthcare Providers: SeriousBroker.it  This test is not yet approved or cleared by the [place] FDA and has been authorized for detection and/or diagnosis of SARS-CoV-2 by FDA under an Emergency Use Authorization (EUA). This EUA will remain in effect (meaning this test can be used) for the duration of the COVID-19 declaration  under Section 564(b)(1) of the Act, 21 U.S.C. section 360bbb-3(b)(1), unless the authorization is terminated or revoked.     Resp Syncytial Virus by PCR NEGATIVE NEGATIVE Final    Comment: (NOTE) Fact Sheet for Patients: BloggerCourse.com  Fact Sheet for Healthcare Providers: SeriousBroker.it  This test is not yet approved or cleared by the [place] FDA and has been authorized for detection and/or diagnosis of SARS-CoV-2 by FDA under an Emergency Use Authorization (EUA). This EUA will remain in effect (meaning this test can be used) for the duration of the COVID-19 declaration under Section 564(b)(1) of the Act, 21 U.S.C. section 360bbb-3(b)(1), unless the authorization is terminated or revoked.  Performed at [facility], [address] [place], [place] [address]      Radiological Exams on Admission: CT ABDOMEN PELVIS W CONTRAST Result Date:  01/28/2023 CLINICAL DATA:  Sepsis EXAM: CT ABDOMEN AND PELVIS WITH CONTRAST TECHNIQUE: Multidetector CT imaging of the abdomen and pelvis was performed using the standard protocol following bolus administration of intravenous contrast. RADIATION DOSE REDUCTION: This exam was performed according to the departmental dose-optimization program which includes automated exposure control, adjustment of the mA and/or kV according to patient size and/or use of iterative reconstruction technique. CONTRAST:  75mL OMNIPAQUE IOHEXOL 300 MG/ML  SOLN COMPARISON:  None Available. FINDINGS: Lower chest: No acute abnormality. Coronary artery calcifications in the visualized right coronary artery. Hepatobiliary: No focal hepatic abnormality. Gallbladder unremarkable. Pancreas: No focal abnormality or ductal dilatation. Spleen: 2.4 cm low-density lesion off the anterior tip of the spleen, most compatible with cyst. Normal size. Adrenals/Urinary Tract: No adrenal abnormality. No focal renal abnormality. No stones or hydronephrosis. Urinary bladder is unremarkable. Stomach/Bowel: Postoperative changes with left lower quadrant ostomy. Soft tissue in the presacral space, likely postoperative scarring. Moderate stool burden in the colon. No bowel obstruction. Vascular/Lymphatic: Aortic atherosclerosis. No evidence of aneurysm or adenopathy. Reproductive: No visible focal abnormality. Other: No free fluid or free air. Musculoskeletal: No acute bony abnormality. IMPRESSION: No acute findings in the abdomen or pelvis. Moderate stool burden in the colon. Left lower quadrant ostomy. No bowel obstruction. Aortic atherosclerosis, coronary artery disease. Electronically Signed   By: Charlett Nose M.D.   On: 01/28/2023 21:30   DG Foot Complete Left Result Date: 01/28/2023 CLINICAL DATA:  Redness, swelling.  Evaluate for osteomyelitis. EXAM: LEFT FOOT - COMPLETE 3+ VIEW COMPARISON:  None Available. FINDINGS: No acute bony abnormality.  Specifically, no fracture, subluxation, or dislocation. No bone destruction to suggest osteomyelitis. Joint spaces maintained. IMPRESSION: No acute bony abnormality. Electronically Signed   By: Charlett Nose M.D.   On: 01/28/2023 20:59    Assessment/Plan Present on Admission:  Cellulitis LLE//  Diabetic foot ulcer  -Blood cultures x 2 -Wound care consult placed -Cefepime and vancomycin initiated in ER.  Continue with IV Zosyn.  MRSA screen ordered. -X-ray extremity benign. -Podiatry consult per a.m. team -Pain medications as needed. -Antiemetic as needed pain   T2DM //  peripheral neuropathy -Sliding scale insulin -Lantus resumed -Lantus resumed.  Patient takes 40 units of Lantus.  Sleep and 75 2530 units twice daily..  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   HTN -Hold Demadex, Cozaar due to AKI   HLD -Atorvastatin Crestor resumed   BPH -Flomax resumed   History of colon cancer  Secondary hyperparathyroidism of renal origin -Wound care consult for ostomy   Arad Burston 01/28/2023, 10:54 PM

## 2023-01-29 ENCOUNTER — Encounter: Payer: Self-pay | Admitting: Radiology

## 2023-01-29 ENCOUNTER — Inpatient Hospital Stay: Payer: Medicare Other

## 2023-01-29 LAB — CBG MONITORING, ED
Glucose-Capillary: 269 mg/dL — ABNORMAL HIGH (ref 70–99)
Glucose-Capillary: 240 mg/dL — ABNORMAL HIGH (ref 70–99)
Glucose-Capillary: 242 mg/dL — ABNORMAL HIGH (ref 70–99)
Glucose-Capillary: 205 mg/dL — ABNORMAL HIGH (ref 70–99)

## 2023-01-29 LAB — HEMOGLOBIN A1C
Hgb A1c MFr Bld: 11.4 % — ABNORMAL HIGH (ref 4.8–5.6)
Mean Plasma Glucose: 280.48 mg/dL

## 2023-01-29 LAB — URINALYSIS, ROUTINE W REFLEX MICROSCOPIC
Bacteria, UA: NONE SEEN
Bilirubin Urine: NEGATIVE
Glucose, UA: >=500 mg/dL — AB
Ketones, ur: 5 mg/dL — AB
Nitrite: NEGATIVE
Protein, ur: >=300 mg/dL — AB
Specific Gravity, Urine: 1.031 — ABNORMAL HIGH (ref 1.005–1.030)
WBC, UA: >50 WBC/hpf (ref 0–5)
pH: 6 (ref 5.0–8.0)

## 2023-01-29 LAB — MRSA NEXT GEN BY PCR, NASAL: MRSA by PCR Next Gen: NOT DETECTED

## 2023-01-29 LAB — GLUCOSE, CAPILLARY: Glucose-Capillary: 213 mg/dL — ABNORMAL HIGH (ref 70–99)

## 2023-01-29 MED ORDER — ROSUVASTATIN CALCIUM 10 MG PO TABS
20.0000 mg | ORAL_TABLET | Freq: Every day | ORAL | Status: DC
Start: 2023-01-29 — End: 2023-02-02
  Administered 2023-01-29 – 2023-02-02 (×5): 20 mg via ORAL
  Filled 2023-01-29 (×2): qty 2
  Filled 2023-01-29: qty 1
  Filled 2023-01-29 (×2): qty 2

## 2023-01-29 MED ORDER — GABAPENTIN 100 MG PO CAPS
100.0000 mg | ORAL_CAPSULE | Freq: Three times a day (TID) | ORAL | Status: DC
  Administered 2023-01-29 – 2023-02-02 (×5): 100 mg via ORAL
  Filled 2023-01-29 (×14): qty 1

## 2023-01-29 MED ORDER — TAMSULOSIN HCL 0.4 MG PO CAPS
0.4000 mg | ORAL_CAPSULE | Freq: Every day | ORAL | Status: DC
  Administered 2023-01-29 – 2023-02-02 (×5): 0.4 mg via ORAL
  Filled 2023-01-29 (×5): qty 1

## 2023-01-29 MED ORDER — INSULIN GLARGINE-YFGN 100 UNIT/ML ~~LOC~~ SOLN
15.0000 [IU] | Freq: Every day | SUBCUTANEOUS | Status: DC
  Administered 2023-01-29: 15 [IU] via SUBCUTANEOUS
  Filled 2023-01-29 (×2): qty 0.15

## 2023-01-29 MED ORDER — PIPERACILLIN-TAZOBACTAM 3.375 G IVPB
3.3750 g | Freq: Three times a day (TID) | INTRAVENOUS | Status: DC
  Administered 2023-01-29 – 2023-01-30 (×4): 3.375 g via INTRAVENOUS
  Filled 2023-01-29 (×4): qty 50

## 2023-01-29 NOTE — Progress Notes (Signed)
Pharmacy Antibiotic Note  Jason Jennings is a 57 y.o. male admitted on 01/28/2023 with cellulitis.  Pharmacy has been consulted for Zosyn dosing.  Plan: Zosyn 3.375g IV q8h (4 hour infusion).  Height: 5\' 6"  (167.6 cm) Weight: 88.5 kg (195 lb) IBW/kg (Calculated) : 63.8  Temp (24hrs), Avg:100.3 F (37.9 C), Min:100.3 F (37.9 C), Max:100.3 F (37.9 C)  Recent Labs  Lab 01/28/23 1635  WBC 13.2*  CREATININE 2.30*  LATICACIDVEN 1.8    Estimated Creatinine Clearance: 36.5 mL/min (A) (by C-G formula based on SCr of 2.3 mg/dL (H)).    No Known Allergies  Antimicrobials this admission:   >>    >>   Dose adjustments this admission:   Microbiology results:  BCx:   UCx:    Sputum:    MRSA PCR:   Thank you for allowing pharmacy to be a part of this patient's care.  Greg Cratty D 01/29/2023 1:18 AM

## 2023-01-29 NOTE — ED Notes (Signed)
This RN to bedside to introduce self to pt. Pt is sleeping at this time. In no distress and call bell within reach.

## 2023-01-29 NOTE — Progress Notes (Addendum)
Pt arrived from ED via transport. RN helped transport to move patient into patient room. RN oriented pt to the unit and assessed the pt. RN answered pt's questions. Call bell within reach. RN will continue to monitor.  RN delegated getting VS to nurse aide.

## 2023-01-29 NOTE — Progress Notes (Signed)
Pt complained about his insulin dosage, saying, "It's not enough insulin. I always have higher blood glucose level at the hospital." RN provided education and answered pt questions. Pt said that he was satisfied with the education. RN will continue to monitor.

## 2023-01-29 NOTE — Progress Notes (Addendum)
Progress Note   Patient: Jason Jennings MWU:[id number] DOB: Jan 24, 1966 DOA: 01/28/2023     1 DOS: the patient was seen and examined on 01/29/2023   Brief hospital course: This is a 57 year old male with past medical history of T2DM, poorly controlled, severe peripheral neuropathy, HLD, HTN, BPH, colon cancer.    The patient reports a history of peripheral neuropathy.  The patient presents stating since 4 days before admission, he has been unable to keep anything down due to persistent nausea and vomiting.  He noticed that his LLE was becoming warm to touch about three days ago. He states he likely developed the ulcer on the bottom of his foot about a week ago after stepping on a metal clip. He endorses chills but denies fevers.  He denies lightheadedness, dizziness, abdominal pain, diarrhea.  Because his LLE remained warm to touch he presented to the ED for further evaluation.    In the ER patient's vitals 124/90, 108, 18, Tmax 100.3.  Creatinine 2.3, baseline 2.1, WBC 13.2, glucose 345.Marland Kitchen  Examination of sole of the patient's left lower extremity was notable for metal clip stuck in the ball of the foot.  The patient denies pain.  He states he is unaware of when he stepped on the object.  Patient given IV cefepime and vancomycin.  Admission requested.  Assessment and Plan: Cellulitis LLE// Diabetic foot ulcer  Leukocytosis, Tmax 100.3. On exam with erythematous and warm to touch LLE. Noted to have diabetic foot ulcer on plantar surface without purulent drainage  -Blood cultures x 2, NG <12h -MRI of LLE without evidence of osteo or septic arthritis No abscess noted. 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  Transitioned to  IV Zosyn.  --Podiatry consulted, noted patient could be continued on PO antibiotics and follow up outpatient with them.  He will also need a postop shoe for his left foot. -Wound care consult placed -Pain medications as needed. -Antiemetic as  needed -Likely transition to PO antibiotics on discharge tomorrow if remains hemodynamically stable and cellulitis improving     T2DM // peripheral neuropathy Poorly controlled, A1c 11.4 -Lantus 15 units plus sliding scale.  Patient takes 10 units of Lantus at home and 25 units twice daily of 75/25 insulin  AKI on CKD stage IIIb Secondary hyperparathyroidism     Latest Ref Rng & Units 01/28/2023    4:35 PM 05/16/2020    6:08 PM 04/11/2019    8:25 PM  BMP  Glucose 70 - 99 mg/dL 725  366  440   BUN 6 - 20 mg/dL 35  41  26   Creatinine 0.61 - 1.24 mg/dL 3.47  4.25  9.56   Sodium 135 - 145 mmol/L 132  137  133   Potassium 3.5 - 5.1 mmol/L 3.7  4.4  3.9   Chloride 98 - 111 mmol/L 95  100  97   CO2 22 - 32 mmol/L 26  24  28    Calcium 8.9 - 10.3 mg/dL 8.9  9.7  8.6   Likely due to poor PO intake. Baseline appears to be 2.  He is status post 1 L of normal saline. -Continue LR this AM  -BMP in the AM -Avoid nephrotoxic agents   Hyponatremia due hyperglycemia  Anemia of chronic kidney disease CBC    Component Value Date/Time   WBC 13.2 (H) 01/28/2023 1635   RBC 3.97 (L) 01/28/2023 1635   HGB 11.6 (L) 01/28/2023 1635   HCT 34.3 (L) 01/28/2023 1635  PLT 157 01/28/2023 1635   MCV 86.4 01/28/2023 1635   MCH 29.2 01/28/2023 1635   MCHC 33.8 01/28/2023 1635   RDW 12.6 01/28/2023 1635   LYMPHSABS 0.6 (L) 05/16/2020 1808   MONOABS 0.4 05/16/2020 1808   EOSABS 0.0 05/16/2020 1808   BASOSABS 0.0 05/16/2020 1808   Stable  Iron panel 09/2022 no Fe deficiency, vitb12 WNL.Marland Kitchen  Hypertension Hold Cozaar and Demadex due to AKI   HyperLipidemia Continue Crestor  BPH Colon cancer Wound care consulted for ostomy     Subjective: Continues to have some nausea, no emesis.  Has some mild left lower extremity pain.  Physical Exam: Vitals:   01/29/23 0900 01/29/23 1300 01/29/23 1430 01/29/23 1445  BP: (!) 147/64 (!) 151/68 (!) 132/55   Pulse: 80 87 84   Resp: 18 18    Temp:    99.4 F  (37.4 C)  TempSrc:    Oral  SpO2: 95% 98% 92%   Weight:      Height:       Physical Exam  Constitutional: In no distress.  Cardiovascular: Normal rate, regular rhythm.  Pulmonary: Non labored breathing on room air, no wheezing or rales. Abdominal: Soft. Normal bowel sounds. Non distended and non tender.  Ostomy with small amount of soft brown stool. Musculoskeletal: Normal range of motion.     Neurological: Alert and oriented to person, place, and time. Non focal  Skin: Skin is warm, creased warmth of left lower extremity.  Overlying skin with erythema.  Compartments are soft.  Total surface of left foot with ulceration    Media Information    Data Reviewed:  I reviewed all labs and images  Family Communication: None, patient able to relay information to family  Disposition: Status is: Inpatient Remains inpatient appropriate because: IV antibiotics  Planned Discharge Destination: Home    Time spent: 40 minutes  Author: Marolyn Haller, MD 01/29/2023 3:43 PM  For on call review [email or web address].

## 2023-01-30 DIAGNOSIS — L02619 Cutaneous abscess of unspecified foot: Secondary | ICD-10-CM | POA: Insufficient documentation

## 2023-01-30 DIAGNOSIS — L02612 Cutaneous abscess of left foot: Secondary | ICD-10-CM

## 2023-01-30 DIAGNOSIS — E11628 Type 2 diabetes mellitus with other skin complications: Secondary | ICD-10-CM | POA: Insufficient documentation

## 2023-01-30 DIAGNOSIS — L089 Local infection of the skin and subcutaneous tissue, unspecified: Secondary | ICD-10-CM

## 2023-01-30 LAB — BASIC METABOLIC PANEL
Anion gap: 10 (ref 5–15)
BUN: 37 mg/dL — ABNORMAL HIGH (ref 6–20)
CO2: 25 mmol/L (ref 22–32)
Calcium: 8.2 mg/dL — ABNORMAL LOW (ref 8.9–10.3)
Chloride: 101 mmol/L (ref 98–111)
Creatinine, Ser: 2.64 mg/dL — ABNORMAL HIGH (ref 0.61–1.24)
GFR, Estimated: 27 mL/min — ABNORMAL LOW (ref >60)
Glucose, Bld: 193 mg/dL — ABNORMAL HIGH (ref 70–99)
Potassium: 3.4 mmol/L — ABNORMAL LOW (ref 3.5–5.1)
Sodium: 136 mmol/L (ref 135–145)

## 2023-01-30 LAB — CBC
HCT: 27.5 % — ABNORMAL LOW (ref 39.0–52.0)
Hemoglobin: 9.2 g/dL — ABNORMAL LOW (ref 13.0–17.0)
MCH: 29.3 pg (ref 26.0–34.0)
MCHC: 33.5 g/dL (ref 30.0–36.0)
MCV: 87.6 fL (ref 80.0–100.0)
Platelets: 144 10*3/uL — ABNORMAL LOW (ref 150–400)
RBC: 3.14 MIL/uL — ABNORMAL LOW (ref 4.22–5.81)
RDW: 12.9 % (ref 11.5–15.5)
WBC: 8.7 10*3/uL (ref 4.0–10.5)
nRBC: 0 % (ref 0.0–0.2)

## 2023-01-30 LAB — GLUCOSE, CAPILLARY
Glucose-Capillary: 205 mg/dL — ABNORMAL HIGH (ref 70–99)
Glucose-Capillary: 273 mg/dL — ABNORMAL HIGH (ref 70–99)
Glucose-Capillary: 165 mg/dL — ABNORMAL HIGH (ref 70–99)
Glucose-Capillary: 149 mg/dL — ABNORMAL HIGH (ref 70–99)

## 2023-01-30 MED ORDER — AMOXICILLIN-POT CLAVULANATE 875-125 MG PO TABS: 1.0000 | ORAL_TABLET | Freq: Two times a day (BID) | ORAL | Status: DC

## 2023-01-30 MED ORDER — HEPARIN SODIUM (PORCINE) 5000 UNIT/ML IJ SOLN
5000.0000 [IU] | Freq: Three times a day (TID) | INTRAMUSCULAR | Status: DC
  Administered 2023-01-30 – 2023-02-02 (×10): 5000 [IU] via SUBCUTANEOUS
  Filled 2023-01-30 (×11): qty 1

## 2023-01-30 MED ORDER — POTASSIUM CHLORIDE 20 MEQ PO PACK
20.0000 meq | PACK | Freq: Once | ORAL | Status: AC
  Administered 2023-01-30: 20 meq via ORAL
  Filled 2023-01-30: qty 1

## 2023-01-30 MED ORDER — INSULIN GLARGINE-YFGN 100 UNIT/ML ~~LOC~~ SOLN
25.0000 [IU] | Freq: Every day | SUBCUTANEOUS | Status: DC
  Administered 2023-01-30 – 2023-01-31 (×2): 25 [IU] via SUBCUTANEOUS
  Filled 2023-01-30 (×3): qty 0.25

## 2023-01-30 MED ORDER — PIPERACILLIN-TAZOBACTAM 3.375 G IVPB
3.3750 g | Freq: Three times a day (TID) | INTRAVENOUS | Status: DC
  Administered 2023-01-30 – 2023-02-01 (×6): 3.375 g via INTRAVENOUS
  Filled 2023-01-30 (×6): qty 50

## 2023-01-30 MED ORDER — DOXYCYCLINE HYCLATE 100 MG PO TABS: 100.0000 mg | ORAL_TABLET | Freq: Two times a day (BID) | ORAL | Status: DC

## 2023-01-30 MED ORDER — INSULIN ASPART 100 UNIT/ML IJ SOLN: 4.0000 [IU] | Freq: Three times a day (TID) | INTRAMUSCULAR | Status: DC

## 2023-01-30 MED ORDER — INSULIN ASPART 100 UNIT/ML IJ SOLN
5.0000 [IU] | Freq: Three times a day (TID) | INTRAMUSCULAR | Status: DC
  Administered 2023-01-30 – 2023-02-01 (×7): 5 [IU] via SUBCUTANEOUS
  Filled 2023-01-30 (×5): qty 1

## 2023-01-30 MED ORDER — VANCOMYCIN VARIABLE DOSE PER UNSTABLE RENAL FUNCTION (PHARMACIST DOSING): Status: DC

## 2023-01-30 MED ORDER — VANCOMYCIN HCL 2000 MG/400ML IV SOLN
2000.0000 mg | Freq: Once | INTRAVENOUS | Status: AC
  Administered 2023-01-30: 2000 mg via INTRAVENOUS
  Filled 2023-01-30 (×3): qty 400

## 2023-01-30 NOTE — Plan of Care (Signed)

## 2023-01-30 NOTE — Progress Notes (Signed)
Pharmacy Antibiotic Note  Jason Jennings is a 57 y.o. male admitted on 01/28/2023 with  wound infection . Patient received one-time doses of Cefepime, Flagyl, and Vancomycin (no loading dose) in the ED on 12/21, and was briefly started on Zosyn on 12/22. Patient s/p I&D of L foot ulcer 12/23 AM. Pharmacy now consulted for Vancomycin and Zosyn dosing.  Plan: Vancomycin 2g IV x1 - (unstable renal function, subsequent dosing to be determined by clinical pharmacist) Zosyn 3.375g IV q8h (4 hour infusion). Monitor renal function, cultures, clinical status Follow up LOT and de-escalate as able Obtain Vancomycin levels as clinically indicated   Height: 5\' 6"  (167.6 cm) Weight: 88.5 kg (195 lb) IBW/kg (Calculated) : 63.8  Temp (24hrs), Avg:99.4 F (37.4 C), Min:98.9 F (37.2 C), Max:99.9 F (37.7 C)  Recent Labs  Lab 01/28/23 1635 01/30/23 0258  WBC 13.2* 8.7  CREATININE 2.30* 2.64*  LATICACIDVEN 1.8  --     Estimated Creatinine Clearance: 31.8 mL/min (A) (by C-G formula based on SCr of 2.64 mg/dL (H)).    No Known Allergies  Antimicrobials this admission: Cefepime 12/21 x1 Metronidazole 12/21 x1 Vancomycin 12/21, 12/23 >>  Zosyn 12/22 >>   Microbiology results: 12/23 Wound Cx: in process 12/22 BCx: ngtd 12/22 MRSA PCR: not detected  Thank you for allowing pharmacy to be a part of this patient's care.  Jerrilyn Cairo, PharmD 01/30/2023 7:44 PM

## 2023-01-30 NOTE — Progress Notes (Signed)
Progress Note   Patient: Jason Jennings RUE:[id number] DOB: [birth year] DOA: 01/28/2023     2 DOS: the patient was seen and examined on 01/30/2023   Brief hospital course: This is a 57 year old male with past medical history of T2DM, poorly controlled, severe peripheral neuropathy, HLD, HTN, BPH, colon cancer.    The patient reports a history of peripheral neuropathy.  The patient presents stating since 4 days before admission, he has been unable to keep anything down due to persistent nausea and vomiting.  He noticed that his LLE was becoming warm to touch about three days ago. He states he likely developed the ulcer on the bottom of his foot about a week ago after stepping on a metal clip. He endorses chills but denies fevers.  He denies lightheadedness, dizziness, abdominal pain, diarrhea.  Because his LLE remained warm to touch he presented to the ED for further evaluation.    In the ER patient's vitals 124/90, 108, 18, Tmax 100.3.  Creatinine 2.3, baseline 2.1, WBC 13.2, glucose 345.Marland Kitchen  Examination of sole of the patient's left lower extremity was notable for metal clip stuck in the ball of the foot.  The patient denies pain.  He states he is unaware of when he stepped on the object.  Patient given IV cefepime and vancomycin.  Admission requested.  Assessment and Plan: Cellulitis LLE// Diabetic foot ulcer  Leukocytosis, Tmax 100.3. On exam with erythematous and warm to touch LLE. Noted to have diabetic foot ulcer on plantar surface without purulent drainage  -Blood cultures x 2, NG <12h -MRI of LLE without evidence of osteo or septic arthritis No abscess noted. 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  Transitioned to  IV Zosyn.   --Podiatry consulted, and performed bedside I&D of L foot ulcer 12/23 AM -Cellulitis is improving -Continue IV antibiotics pending wound cultures  -Will need HH with wound care and outpt podiatry follow up  -Pain medications as  needed. -Antiemetic as needed     T2DM // peripheral neuropathy Poorly controlled, A1c 11.4 -Lantus 15 units plus sliding scale.  Patient takes 10 units of Lantus at home and 25 units twice daily of 75/25 insulin  AKI on CKD stage IIIb Secondary hyperparathyroidism     Latest Ref Rng & Units 01/30/2023    2:58 AM 01/28/2023    4:35 PM 05/16/2020    6:08 PM  BMP  Glucose 70 - 99 mg/dL 147  829  562   BUN 6 - 20 mg/dL 37  35  41   Creatinine 0.61 - 1.24 mg/dL 1.30  8.65  7.84   Sodium 135 - 145 mmol/L 136  132  137   Potassium 3.5 - 5.1 mmol/L 3.4  3.7  4.4   Chloride 98 - 111 mmol/L 101  95  100   CO2 22 - 32 mmol/L 25  26  24    Calcium 8.9 - 10.3 mg/dL 8.2  8.9  9.7   -Likely due to hypovolemia  -Now taking PO well  -BMP in the AM -Avoid nephrotoxic agents   Hypokalemia  Replete   Anemia of chronic kidney disease CBC    Component Value Date/Time   WBC 8.7 01/30/2023 0258   RBC 3.14 (L) 01/30/2023 0258   HGB 9.2 (L) 01/30/2023 0258   HCT 27.5 (L) 01/30/2023 0258   PLT 144 (L) 01/30/2023 0258   MCV 87.6 01/30/2023 0258   MCH 29.3 01/30/2023 0258   MCHC 33.5 01/30/2023 0258   RDW  12.9 01/30/2023 0258   LYMPHSABS 0.6 (L) 05/16/2020 1808   MONOABS 0.4 05/16/2020 1808   EOSABS 0.0 05/16/2020 1808   BASOSABS 0.0 05/16/2020 1808   Stable  Iron panel 09/2022 no Fe deficiency, vitb12 WNL.   Hypertension Hold Cozaar and Demadex due to AKI   HyperLipidemia Continue Crestor  BPH Colon cancer Wound care consulted for ostomy     Subjective: Continues to have some nausea, no emesis.  Has some mild left lower extremity pain.  Physical Exam: Vitals:   01/29/23 1900 01/29/23 2000 01/29/23 2215 01/30/23 0843  BP: 131/61 (!) 145/75 (!) 145/69 (!) 161/77  Pulse: 67 78 67 72  Resp:  18 18 18   Temp:   99.9 F (37.7 C) 98.9 F (37.2 C)  TempSrc:      SpO2: 93% 98% 99% 98%  Weight:      Height:       Physical Exam  Constitutional: In no distress.   Cardiovascular: Normal rate, regular rhythm.1+ RLE edema  Pulmonary: Non labored breathing on room air, no wheezing or rales.  \ Abdominal: Soft. Normal bowel sounds. Non distended and non tender Musculoskeletal: Normal range of motion.     Neurological: Alert and oriented to person, place, and time. Non focal  Skin: Skin is warm and dry. LLE skin below the knee erythematous, improved. From day prior. Warm to touch     Data Reviewed:  I reviewed all labs and images  Family Communication: None, patient able to relay information to family  Disposition: Status is: Inpatient Remains inpatient appropriate because: IV antibiotics  Planned Discharge Destination: Home    Time spent: 40 minutes  Author: Marolyn Haller, MD 01/30/2023 7:18 PM  For on call review [email or web address].

## 2023-01-30 NOTE — Consult Note (Signed)
PODIATRY CONSULTATION  NAME Jason Jennings MRN [id number] DOB 08-23-1965 DOA 01/28/2023   Reason for consult:  Chief Complaint  Patient presents with   Emesis    Attending/Consulting physician:   History of present illness: "57 y.o. male with past medical history of T2DM, poorly controlled, severe peripheral neuropathy, HLD, HTN, BPH, colon cancer.  The patient reports persistent numbness in the feet and both feet.  The patient presents stating since Tuesday he has been unable to keep anything down due to persistent nausea and vomiting.  He endorses chills but denies fevers.  He denies lightheadedness, dizziness, abdominal pain, diarrhea.  Yesterday he noted his left lower extremity becoming hot to touch.  He denies cuts or abrasions in the lower extremity.  He came to the ER.   In the ER patient's vitals 124/90, 108, 18, Tmax 100.3.  Creatinine 2.3, baseline 2.1, WBC 13.2, glucose 345.Marland Kitchen  Examination of sole of the patient's left lower extremity was notable for metal clip stuck in the ball of the foot.  The patient denies pain.  He states he is unaware of when he stepped on the object.  Patient given IV cefepime and vancomycin.  Admission requested."  Reports he had a puncture wound to the right foot that worsened recently prompting admission. States it was a metal clip. Had drainage redness and swelling of the foot which prompted admission. Has previously seen a podiatrist in [place], but wants to follow up with a provider in [place] if possible.   Past Medical History:  Diagnosis Date   Cancer (HCC)    Diabetes mellitus without complication (HCC)    Neuropathy        Latest Ref Rng & Units 01/30/2023    2:58 AM 01/28/2023    4:35 PM 05/16/2020    6:08 PM  CBC  WBC 4.0 - 10.5 K/uL 8.7  13.2  6.8   Hemoglobin 13.0 - 17.0 g/dL 9.2  40.9  81.1   Hematocrit 39.0 - 52.0 % 27.5  34.3  32.9   Platelets 150 - 400 K/uL 144  157  197        Latest Ref Rng & Units 01/30/2023     2:58 AM 01/28/2023    4:35 PM 05/16/2020    6:08 PM  BMP  Glucose 70 - 99 mg/dL 914  782  956   BUN 6 - 20 mg/dL 37  35  41   Creatinine 0.61 - 1.24 mg/dL 2.13  0.86  5.78   Sodium 135 - 145 mmol/L 136  132  137   Potassium 3.5 - 5.1 mmol/L 3.4  3.7  4.4   Chloride 98 - 111 mmol/L 101  95  100   CO2 22 - 32 mmol/L 25  26  24    Calcium 8.9 - 10.3 mg/dL 8.2  8.9  9.7       Physical Exam: Lower Extremity Exam Vasc: R - PT palpable, DP palpable. Cap refill < 3 sec to digits  L - PT palpable, DP palpable. Cap refill <3 sec to digits  Derm: R - Normal temp/texture/turgor with no open lesion or clinical signs of infection   L - Draining sinus tract plantar forefoot near 4th met head area. Purulence exprssed from the superficial soft tissues. Upond debridement this is fat necrosis and necrotic/fibrotic tissues in the wound base. Does not probe to bone. Erythema and 2+ edema of the left foot.  MSK:  R -  No gross deformities. Compartments soft, non-tender,  compressible  L - Edema to mid leg  Neuro: R - Gross sensation absent. Gross motor function intact   L - Gross sensation absent. Gross motor function intact    ASSESSMENT/PLAN OF CARE 57 y.o. male with PMHx significant for  T2DM, poorly controlled, severe peripheral neuropathy, HLD, HTN, BPH, colon cancer  with Left foot puncture wound with superficial abscess and resultant cellulitis of left foot.  WBC 8.7 MRI L foot: Diffuse edema no discrete fluid collection, no obvious osteomyelitis.   - Performed bedside irrigation and debridement of the left plantar fforefoot ulcer / puncture wound site - Was able to express some purulence from the superficial tissues during the procedure but at completion believe no further purulence or superficial abscess remained.  - Monitor left foot for clinical improvement, follow cultures -Packed with iodoform gauze - will need 2-3 x weekly dressing changes both here and at home. Recommend home health  nurse for packing dsg changes 3x weekly - Continue IV abx broad spectrum pending further culture data - wound culture was obtained and placed on computer in room, order entered in epic and RN notified. - Anticoagulation: ok to continue per primary  - Wound care: Leave my dressing intact for 2-3 days.  - WB status: WBAT to left foot in post op shoe - Will continue to follow   Thank you for the consult.  Please contact me directly with any questions or concerns.           Corinna Gab, DPM [facility]    [address] [place], [place] [address]                Office [phone]  Fax [phone]

## 2023-01-30 NOTE — Procedures (Signed)
Procedure Note  Date of Operation: 3:24 PM, 01/30/2023   Patient: Jason Jennings - 57 y.o. male  Surgeon: * Surgery not found *   Assistant: None  Diagnosis: Left foot abscess  Procedure:  1. Left foot superficial abscess plantar forefoot incision and drainage    Anesthesia: Anesthesia type cannot be found on the log.  Responsible provider cannot be found from this context.  Anesthesia staff cannot be found from this context.   Estimated Blood Loss: Minimal   Hemostasis: 1) Anatomical dissection, mechanical compression, electrocautery 2) No tourniquet was used  Implants: none  Materials: Iodoform packing  Injectables: 1) Pre-operatively: None - pt totall neuropathic 2) Post-operatively: None   Specimens: Pathology: NOne  Microbiology: Left foot abscess deep swab culture   Antibiotics: IV antibiotics given per schedule on the floor  Drains: None  Complications: Patient tolerated the procedure well without complication.   Operative findings: As below in detailed report  Indications for Procedure: Jason Jennings presents to [facility] with a chief complaint of left foot redness swelling and draining ulcer. The patient has failed conservative treatments of various modalities. At this time the patient has elected to proceed with procedure.   Description of Procedure: Patient remained on hospital bed.  Correct side and site identified.  Left foot prepped with betadine paint.  Attention was directed to the plantar wound. I used a suture removal set scissors and forceps to remove necrotic and non viable tissue from ovelrying the sinus tract. I then uses manual pressure to express purulence from the site. I excisionally debrided soft tissues to sub q fat layer with scissors with removal of necrotic fat. I was able to express purulence. The wound did not probe to bone. A deep tissue swab was taken. I then irrigated the sinus tract thoroughly with saline  flushes. I then packed the wound with iodoform gauze soaked in betadine and dressed with betadine wet to dry dressings.   The surgical site was then dressed with 4x4 fluff gauze, kerlix, ace.  Surgical plan:  No current formal OR plans. Did drain small superficial abscess. Packing dsg changes 3x weekly with idoform packing.   The patient will be WBAT in a post op shoe to the operative limb until further instructed. The dressing is to remain clean, dry, and intact. Will continue to follow unless noted elsewhere.   Carlena Hurl, DPM [facility]

## 2023-01-30 NOTE — Plan of Care (Signed)
  Problem: Education: Goal: Ability to describe self-care measures that may prevent or decrease complications (Diabetes Survival Skills Education) will improve Outcome: Progressing Goal: Individualized Educational Video(s) Outcome: Progressing   

## 2023-01-30 NOTE — Consult Note (Addendum)
WOC Nurse Consult Note: Reason for Consult:LLE ulcer  Wound type: full thickness L plantar foot (base of 5th digit) traumatic per patient (stepped on a piece of metal) vs diabetic (some callus surrounding)  Pressure Injury POA: NA  Measurement: 1 cm x 1 cm total area with approximately 0.2 cm x 0.2 cm opening that is draining purulent material  Wound bed: pink moist what can be visualized  Drainage (amount, consistency, odor) can express purulent exudate  Periwound: callused, can see area of fluctuance with what appears to be purulence under skin, edema and erythema noted to dorsal foot and up onto leg (being treated for cellulitis currently)  Dressing procedure/placement/frequency: Cleanse L plantar foot wound with Betadine, apply silver hydrofiber Hart [place] [phone]) cut to fit wound bed daily.  Cover with dry gauze and Kerlix roll gauze.    POC discussed with bedside nurse and primary MD.  Needs follow-up with podiatry or orthopedics likely for I&D of area.    WOC Nurse ostomy consult note; patient with longstanding colostomy placed at [facility] 4 years ago per patient  Stoma type/location: LLQ permanent colostomy  Stomal assessment/size: flush with skin, approximately 1 1/2" oval  Peristomal assessment: intact  Treatment options for stomal/peristomal skin: 2" barrier ring  Output minimal soft brown stool in pouch  Ostomy pouching: wears a 2 piece convex 2 1/4" skin barrier [facility] [place] [phone]) 2 1/4" pouch Hart [place] [phone]) and 2" barrier ring Hart [place] [phone])  Education provided: none, patient has managed ostomy at home independently for 4 years.   Enrolled patient in [employer] DC program: no established ostomy who is already receiving supplies monthly   Left an additional 2 1/4" flat skin barrier, pouch and barrier ring in room in case needs changing however patient usually wears for 4-5 days.  Patient says he will ask son to bring in supplies from home as well.    Thank you,    Priscella Mann MSN, RN-BC, [employer] [phone]

## 2023-01-30 NOTE — Inpatient Diabetes Management (Addendum)
Inpatient Diabetes Program Recommendations  AACE/ADA: New Consensus Statement on Inpatient Glycemic Control (2015)  Target Ranges:  Prepandial:   less than 140 mg/dL      Peak postprandial:   less than 180 mg/dL (1-2 hours)      Critically ill patients:  140 - 180 mg/dL   Lab Results  Component Value Date   GLUCAP 273 (H) 01/30/2023   HGBA1C 11.4 (H) 01/28/2023    Review of Glycemic Control  Diabetes history: DM 2, Seen Dr. Gershon Crane in the past at the [facility] clinic Outpatient Diabetes medications: 75/25 30 units bid, Lantus 14 units qhs (verified by patient) Current orders for Inpatient glycemic control:  Semglee 25 units qhs Novolog 5 units tid meal coverage Novolog 0-15 units tid + hs  A1c 11.4%. pt reports lowert glucose trends than reflective of A1c.  Inpatient Diabetes Program Recommendations:    Note: pt reported to me they discussed their inpatient insulin regimen to the hospitalist for adjustments closer to home dose.   Recommend Freestyle Libre 3 sensor in addition to the reader as pt phone is not compatible with the app. However insurance approved the device already.  Freestyle Libre 3 Sensor order # [id number] Dorathy Daft 3 Reader order # [id number]  Spoke with pt and son at bedside regarding A1c and glucose control at home and in the hospital. Pt has close follow up with his PCP for DM management and has seen Dr. Gershon Crane in the past with Endocrinology. Pt is very savvy with Diabetes knowledge and insulins. Pt had an infection to develop to alter his glucose trends and also had recent travel. Pt has been prescribed FSL 3 in the past but found his phone was not compatible. After learning he has the option of utilizing a reader then he was interested in trying it again. Will monitor glucose trends while here and adjust insulin as needed.  Thanks,  Christena Deem RN, MSN, BC-ADM Inpatient Diabetes Coordinator Team Pager [phone] (8a-5p)

## 2023-01-30 NOTE — Progress Notes (Signed)
2000 - Charge Nurse said to assume care of pt as Night RN who took report from Day Nurse was floated to another unit. Charge Nurse gave updates regarding the pt.  2010 - RN went to the pt and pt said that podiatry performed I and D at bedside, today.   0330 - RN changed pt's complete bedding, sheets, blankets, gown.

## 2023-01-31 DIAGNOSIS — L03119 Cellulitis of unspecified part of limb: Secondary | ICD-10-CM

## 2023-01-31 DIAGNOSIS — E66811 Obesity, class 1: Secondary | ICD-10-CM

## 2023-01-31 DIAGNOSIS — E1140 Type 2 diabetes mellitus with diabetic neuropathy, unspecified: Secondary | ICD-10-CM

## 2023-01-31 DIAGNOSIS — L02612 Cutaneous abscess of left foot: Secondary | ICD-10-CM | POA: Diagnosis not present

## 2023-01-31 DIAGNOSIS — E785 Hyperlipidemia, unspecified: Secondary | ICD-10-CM

## 2023-01-31 DIAGNOSIS — I10 Essential (primary) hypertension: Secondary | ICD-10-CM | POA: Diagnosis not present

## 2023-01-31 DIAGNOSIS — N170 Acute kidney failure with tubular necrosis: Secondary | ICD-10-CM | POA: Diagnosis not present

## 2023-01-31 DIAGNOSIS — E1169 Type 2 diabetes mellitus with other specified complication: Secondary | ICD-10-CM

## 2023-01-31 LAB — BASIC METABOLIC PANEL
Anion gap: 11 (ref 5–15)
BUN: 35 mg/dL — ABNORMAL HIGH (ref 6–20)
CO2: 23 mmol/L (ref 22–32)
Calcium: 8.3 mg/dL — ABNORMAL LOW (ref 8.9–10.3)
Chloride: 101 mmol/L (ref 98–111)
Creatinine, Ser: 2.58 mg/dL — ABNORMAL HIGH (ref 0.61–1.24)
GFR, Estimated: 28 mL/min — ABNORMAL LOW (ref >60)
Glucose, Bld: 110 mg/dL — ABNORMAL HIGH (ref 70–99)
Potassium: 3.3 mmol/L — ABNORMAL LOW (ref 3.5–5.1)
Sodium: 135 mmol/L (ref 135–145)

## 2023-01-31 LAB — VANCOMYCIN, RANDOM: Vancomycin Rm: 19 ug/mL

## 2023-01-31 LAB — CBC
HCT: 28 % — ABNORMAL LOW (ref 39.0–52.0)
Hemoglobin: 9.7 g/dL — ABNORMAL LOW (ref 13.0–17.0)
MCH: 29.8 pg (ref 26.0–34.0)
MCHC: 34.6 g/dL (ref 30.0–36.0)
MCV: 85.9 fL (ref 80.0–100.0)
Platelets: 159 10*3/uL (ref 150–400)
RBC: 3.26 MIL/uL — ABNORMAL LOW (ref 4.22–5.81)
RDW: 13 % (ref 11.5–15.5)
WBC: 8.2 10*3/uL (ref 4.0–10.5)
nRBC: 0 % (ref 0.0–0.2)

## 2023-01-31 LAB — GLUCOSE, CAPILLARY
Glucose-Capillary: 122 mg/dL — ABNORMAL HIGH (ref 70–99)
Glucose-Capillary: 169 mg/dL — ABNORMAL HIGH (ref 70–99)
Glucose-Capillary: 205 mg/dL — ABNORMAL HIGH (ref 70–99)
Glucose-Capillary: 159 mg/dL — ABNORMAL HIGH (ref 70–99)

## 2023-01-31 LAB — MAGNESIUM: Magnesium: 2.3 mg/dL (ref 1.7–2.4)

## 2023-01-31 MED ORDER — POTASSIUM CHLORIDE CRYS ER 20 MEQ PO TBCR
20.0000 meq | EXTENDED_RELEASE_TABLET | Freq: Once | ORAL | Status: AC
  Administered 2023-01-31: 20 meq via ORAL
  Filled 2023-01-31: qty 1

## 2023-01-31 NOTE — Assessment & Plan Note (Signed)
Patient on Crestor.  Pharmacist adjusted medications to close to what he is taking at home with 70/30 insulin and Semglee insulin at night.  Will get rid of sliding scale.  Hemoglobin A1c elevated at 11.4.

## 2023-01-31 NOTE — Assessment & Plan Note (Signed)
On gabapentin.  

## 2023-01-31 NOTE — Progress Notes (Signed)
RN went to change pt's dressing per order. Pt refused, stating that doctor told him not to change the dressing for 3 days.

## 2023-01-31 NOTE — Progress Notes (Signed)
  Progress Note   Patient: Jason Jennings ZOX:[id number] DOB: [birth year] DOA: 01/28/2023     3 DOS: the patient was seen and examined on 01/31/2023   Brief hospital course: 57 year old man past medical history of type 2 diabetes mellitus, peripheral neuropathy, hyperlipidemia, hypertension, BPH, colon cancer.  Patient presented with nausea vomiting.  He noted his lower extremity was hot to the touch.  Patient had a metal clip stuck on the ball of his foot.  Patient was started on IV antibiotics.  12/22 MRI showed marked diffuse subcutaneous soft tissue swelling edema and fluid suggesting a cellulitis.  No findings suspicious for acute septic arthritis or osteomyelitis.  12/23.  Podiatry did an incision and drainage of a superficial abscess. 12/24.  Patient has some erythema extending over the bandage from his left foot.  Assessment and Plan: * Cellulitis Left lower extremity, superficial abscess, diabetic foot ulcer.  Patient on vancomycin and Zosyn.  Culture growing gram-negative rods and gram-positive cocci in pairs  Obesity, Class I, BMI 30.0-34.9 (see actual BMI) BMI 31.47  Type 2 diabetes mellitus with hyperlipidemia (HCC) Patient on Crestor, Semglee insulin 25 units at bedtime plus NovoLog with sliding scale.  Hemoglobin A1c elevated at 11.4.  Neuropathy, diabetic (HCC) On gabapentin  HTN (hypertension) Currently not on any medication  Acute renal failure superimposed on stage 3b chronic kidney disease (HCC) Creatinine 2.58 (previous creatinine 2.19)        Subjective: Patient does not have any feeling in his feet.  Some erythema slightly above the bandage on his left foot.  Patient having family bring in a walking boot.  Admitted with cellulitis and superficial abscess left foot.  Physical Exam: Vitals:   01/29/23 2215 01/30/23 0843 01/30/23 2128 01/31/23 0927  BP: (!) 145/69 (!) 161/77 134/64 128/66  Pulse: 67 72 78 80  Resp: 18 18 18 18   Temp: 99.9 F (37.7 C)  98.9 F (37.2 C) 99.1 F (37.3 C) 98.2 F (36.8 C)  TempSrc:    Oral  SpO2: 99% 98% 97% 100%  Weight:      Height:       Physical Exam HENT:     Head: Normocephalic.  Eyes:     General: Lids are normal.     Conjunctiva/sclera: Conjunctivae normal.  Cardiovascular:     Rate and Rhythm: Normal rate and regular rhythm.     Heart sounds: Normal heart sounds, S1 normal and S2 normal.  Pulmonary:     Breath sounds: No decreased breath sounds, wheezing, rhonchi or rales.  Abdominal:     Palpations: Abdomen is soft.     Tenderness: There is no abdominal tenderness.  Musculoskeletal:     Right lower leg: No swelling.     Left lower leg: Swelling present.  Skin:    General: Skin is warm.     Comments: Slight erythema above bandage left foot.  Neurological:     Mental Status: He is alert and oriented to person, place, and time.     Data Reviewed: Hemoglobin A1c 11.4, potassium 3.3, creatinine 2.58, white blood cell count 8.2, hemoglobin 9.7, platelet count 159  Disposition: Status is: Inpatient Remains inpatient appropriate because: Continue IV antibiotics for cellulitis and superficial abscess.  Still has redness extending above the bandage.  Planned Discharge Destination: Home    Time spent: 28 minutes  Author: Alford Highland, MD 01/31/2023 2:29 PM  For on call review [email or web address].

## 2023-01-31 NOTE — Assessment & Plan Note (Signed)
 Currently not on any medication

## 2023-01-31 NOTE — Assessment & Plan Note (Signed)
Creatinine 2.64 on 12/23.  Today's creatinine 2.57.  Patient CKD may have progressed to stage IV.  Recommend checking BMP as outpatient.

## 2023-01-31 NOTE — Plan of Care (Signed)

## 2023-01-31 NOTE — Assessment & Plan Note (Signed)
Left lower extremity, superficial abscess, diabetic foot ulcer.  Patient on vancomycin and Zosyn.  Culture growing gram-negative rods and gram-positive cocci in pairs

## 2023-01-31 NOTE — Assessment & Plan Note (Signed)
BMI 31.47

## 2023-01-31 NOTE — Care Management Important Message (Signed)
Important Message  Patient Details  Name: Jason Jennings MRN: [id number] Date of Birth: March 09, 1965   Important Message Given:  Yes - Medicare IM     Sherilyn Banker 01/31/2023, 11:26 AM

## 2023-01-31 NOTE — Hospital Course (Signed)
57 year old man past medical history of type 2 diabetes mellitus, peripheral neuropathy, hyperlipidemia, hypertension, BPH, colon cancer.  Patient presented with nausea vomiting.  He noted his lower extremity was hot to the touch.  Patient had a metal clip stuck on the ball of his foot.  Patient was started on IV antibiotics.  12/22 MRI showed marked diffuse subcutaneous soft tissue swelling edema and fluid suggesting a cellulitis.  No findings suspicious for acute septic arthritis or osteomyelitis.  12/23.  Podiatry did an incision and drainage of a superficial abscess. 12/24.  Patient has some erythema extending over the bandage from his left foot. 12/25.  Wound culture sensitivity still pending.  Staph aureus and Klebsiella pneumoniae growing out of culture. 12/26.  Case discussed with ID pharmacist and will switch antibiotics to Cipro and doxycycline upon discharge.

## 2023-02-01 DIAGNOSIS — E1342 Other specified diabetes mellitus with diabetic polyneuropathy: Secondary | ICD-10-CM

## 2023-02-01 DIAGNOSIS — N170 Acute kidney failure with tubular necrosis: Secondary | ICD-10-CM | POA: Diagnosis not present

## 2023-02-01 DIAGNOSIS — D649 Anemia, unspecified: Secondary | ICD-10-CM | POA: Insufficient documentation

## 2023-02-01 DIAGNOSIS — I10 Essential (primary) hypertension: Secondary | ICD-10-CM | POA: Diagnosis not present

## 2023-02-01 DIAGNOSIS — E66811 Obesity, class 1: Secondary | ICD-10-CM | POA: Diagnosis not present

## 2023-02-01 LAB — GLUCOSE, CAPILLARY
Glucose-Capillary: 174 mg/dL — ABNORMAL HIGH (ref 70–99)
Glucose-Capillary: 246 mg/dL — ABNORMAL HIGH (ref 70–99)
Glucose-Capillary: 223 mg/dL — ABNORMAL HIGH (ref 70–99)
Glucose-Capillary: 268 mg/dL — ABNORMAL HIGH (ref 70–99)
Glucose-Capillary: 269 mg/dL — ABNORMAL HIGH (ref 70–99)

## 2023-02-01 LAB — BASIC METABOLIC PANEL
Anion gap: 10 (ref 5–15)
BUN: 35 mg/dL — ABNORMAL HIGH (ref 6–20)
CO2: 25 mmol/L (ref 22–32)
Calcium: 8.5 mg/dL — ABNORMAL LOW (ref 8.9–10.3)
Chloride: 102 mmol/L (ref 98–111)
Creatinine, Ser: 2.45 mg/dL — ABNORMAL HIGH (ref 0.61–1.24)
GFR, Estimated: 30 mL/min — ABNORMAL LOW (ref >60)
Glucose, Bld: 183 mg/dL — ABNORMAL HIGH (ref 70–99)
Potassium: 3.7 mmol/L (ref 3.5–5.1)
Sodium: 137 mmol/L (ref 135–145)

## 2023-02-01 LAB — VANCOMYCIN, RANDOM: Vancomycin Rm: 12 ug/mL

## 2023-02-01 MED ORDER — INSULIN GLARGINE-YFGN 100 UNIT/ML ~~LOC~~ SOLN
14.0000 [IU] | Freq: Every day | SUBCUTANEOUS | Status: DC
  Administered 2023-02-01: 14 [IU] via SUBCUTANEOUS
  Filled 2023-02-01 (×2): qty 0.14

## 2023-02-01 MED ORDER — LOSARTAN POTASSIUM 50 MG PO TABS
50.0000 mg | ORAL_TABLET | Freq: Every day | ORAL | Status: DC
  Administered 2023-02-01 – 2023-02-02 (×2): 50 mg via ORAL
  Filled 2023-02-01 (×2): qty 1

## 2023-02-01 MED ORDER — INSULIN ASPART PROT & ASPART (70-30 MIX) 100 UNIT/ML ~~LOC~~ SUSP
30.0000 [IU] | Freq: Every day | SUBCUTANEOUS | Status: DC
  Administered 2023-02-02: 30 [IU] via SUBCUTANEOUS
  Filled 2023-02-01: qty 10

## 2023-02-01 MED ORDER — SODIUM CHLORIDE 0.9 % IV SOLN
2.0000 g | INTRAVENOUS | Status: DC
  Administered 2023-02-01: 2 g via INTRAVENOUS
  Filled 2023-02-01 (×2): qty 20

## 2023-02-01 MED ORDER — VANCOMYCIN HCL 750 MG/150ML IV SOLN
750.0000 mg | INTRAVENOUS | Status: DC
  Administered 2023-02-01 – 2023-02-02 (×2): 750 mg via INTRAVENOUS
  Filled 2023-02-01 (×2): qty 150

## 2023-02-01 MED ORDER — INSULIN ASPART PROT & ASPART (70-30 MIX) 100 UNIT/ML ~~LOC~~ SUSP
30.0000 [IU] | Freq: Every day | SUBCUTANEOUS | Status: DC
  Administered 2023-02-01: 30 [IU] via SUBCUTANEOUS
  Filled 2023-02-01: qty 10

## 2023-02-01 NOTE — Progress Notes (Signed)
  Subjective:  Patient ID: Jason Jennings, male    DOB: September 24, 1965,  MRN: [id number]  DOS: 01/30/23 Procedure: Left foot incision and drainage of superficial abscess, packed open  57 y.o. male seen for post op check. POD 2 s/p bedside I&D. Denies pain. Culture in progress, pending identification.  WBC normalized.   Review of Systems: Negative except as noted in the HPI. Denies N/V/F/Ch.   Objective:   Vitals:   01/31/23 1542 01/31/23 2329  BP: (!) 149/71 (!) 151/65  Pulse: 66 71  Resp: 18 16  Temp: 98.4 F (36.9 C) 99 F (37.2 C)  SpO2: 100% 97%   Body mass index is 31.47 kg/m. Constitutional Well developed. Well nourished.  Vascular Foot warm and well perfused. Capillary refill normal to all digits.   No calf pain with palpation  Neurologic Normal speech. Oriented to person, place, and time. Epicritic sensation absent  Dermatologic Decreased edema and erythema to the left foot, blister with skin maceration and superficial ulcer to the 4th toe dorsal and medial aspect. Plantar foot wound without purulent drainage today.    Orthopedic: Edema to LLE   Radiographs: No osteomyelitis or deep abscess seen on prior MRI  Pathology: NA  Micro: GNR and GPCs, pending  Assessment:   1. Sepsis, due to unspecified organism, unspecified whether acute organ dysfunction present [facility])    Plan:  Patient was evaluated and treated and all questions answered.  POD # 2 s/p Left foot I&D of superficial abscess, packed open -Overall clinically improving, continue to monitor 4th toe. Dressing changed today with debridement of some macerated necrotic skin from the 4th toe.  - No current surgical plans, will see again Friday if still admitted -XR: deferred new imaging  -WB Status: WBAT in  post op shoe -Sutures: none. -Medications/ABX: Continue IV abx broad spectrum pending culture data. Will need several weeks 2-4 of targeted antibiotic therapy. -Foot redressed - re packed with  iodoform gauze. Next dressing change Friday.  Orders were placed yesterday for home health RN for dressing changes 3x weekly - CM to assist.          Corinna Gab, DPM [facility] / [facility]

## 2023-02-01 NOTE — Assessment & Plan Note (Signed)
Last hemoglobin 9.7.  Will check ferritin to further clarify anemia.

## 2023-02-01 NOTE — Progress Notes (Signed)
Progress Note   Patient: Jason Jennings VWU:[id number] DOB: 07-Nov-1965 DOA: 01/28/2023     4 DOS: the patient was seen and examined on 02/01/2023   Brief hospital course: 57 year old man past medical history of type 2 diabetes mellitus, peripheral neuropathy, hyperlipidemia, hypertension, BPH, colon cancer.  Patient presented with nausea vomiting.  He noted his lower extremity was hot to the touch.  Patient had a metal clip stuck on the ball of his foot.  Patient was started on IV antibiotics.  12/22 MRI showed marked diffuse subcutaneous soft tissue swelling edema and fluid suggesting a cellulitis.  No findings suspicious for acute septic arthritis or osteomyelitis.  12/23.  Podiatry did an incision and drainage of a superficial abscess. 12/24.  Patient has some erythema extending over the bandage from his left foot. 12/25.  Wound culture sensitivity still pending.  Staph aureus and Klebsiella pneumoniae growing out of culture.  Assessment and Plan: * Cellulitis Left lower extremity, superficial abscess, diabetic foot ulcer.  Patient on vancomycin and Zosyn.  And will continue vancomycin and switch Zosyn over to Rocephin.  Culture growing Staph aureus and Klebsiella.  Acute renal failure superimposed on stage 3b chronic kidney disease (HCC) Creatinine 2.64 on 12/23.  (previous creatinine 2.19) today's creatinine 2.45.  Type 2 diabetes mellitus with hyperlipidemia (HCC) Patient on Crestor.  Pharmacist adjusted medications to close to what he is taking at home with 70/30 insulin and Semglee insulin at night.  Will get rid of sliding scale.  Hemoglobin A1c elevated at 11.4.  HTN (hypertension) Continue losartan  Neuropathy, diabetic (HCC) On gabapentin  Obesity, Class I, BMI 30.0-34.9 (see actual BMI) BMI 31.47  Anemia, unspecified Last hemoglobin 9.7.  Will check ferritin to further clarify anemia.        Subjective: Patient feeling okay.  He does not want to leave until he  better.  Awaiting sensitivities on bacteria from culture.  Physical Exam: Vitals:   01/31/23 1542 01/31/23 2329 02/01/23 0835 02/01/23 1524  BP: (!) 149/71 (!) 151/65 (!) 155/77 120/70  Pulse: 66 71 (!) 57 66  Resp: 18 16 17 18   Temp: 98.4 F (36.9 C) 99 F (37.2 C) 97.9 F (36.6 C) 98.3 F (36.8 C)  TempSrc: Oral   Oral  SpO2: 100% 97% 100% 98%  Weight:      Height:       Physical Exam HENT:     Head: Normocephalic.  Eyes:     General: Lids are normal.     Conjunctiva/sclera: Conjunctivae normal.  Cardiovascular:     Rate and Rhythm: Normal rate and regular rhythm.     Heart sounds: Normal heart sounds, S1 normal and S2 normal.  Pulmonary:     Breath sounds: No decreased breath sounds, wheezing, rhonchi or rales.  Abdominal:     Palpations: Abdomen is soft.     Tenderness: There is no abdominal tenderness.  Musculoskeletal:     Right lower leg: No swelling.     Left lower leg: Swelling present.  Skin:    General: Skin is warm.     Comments: Slight erythema above bandage left foot.  Neurological:     Mental Status: He is alert and oriented to person, place, and time.     Data Reviewed: Creatinine 2.45, last hemoglobin 9.7  Disposition: Status is: Inpatient Remains inpatient appropriate because: Awaiting sensitivities for culture  Planned Discharge Destination: Home with Home Health    Time spent: 28 minutes  Author: Alford Highland, MD 02/01/2023 3:40  PM  For on call review [email or web address].

## 2023-02-01 NOTE — Progress Notes (Signed)
Pharmacy Antibiotic Note  Jason Jennings is a 57 y.o. male admitted on 01/28/2023 with  wound infection . Patient received one-time doses of Cefepime, Flagyl, and Vancomycin (no loading dose) in the ED on 12/21, and was briefly started on Zosyn on 12/22. Patient s/p I&D of L foot ulcer 12/23 AM. Pharmacy now consulted for Vancomycin and Zosyn dosing.  Plan: Vancomycin 2g IV x1 - (unstable renal function, subsequent dosing to be determined by clinical pharmacist) Zosyn 3.375g IV q8h (4 hour infusion). Monitor renal function, cultures, clinical status Follow up LOT and de-escalate as able Obtain Vancomycin levels as clinically indicated   02/01/23 Creatinine slowly improving 12/23 2052 Pt given Vanc 2 gm over 2 hrs 12/24 2104 Vrdm = 19 12/25 0426 Vrdm = 12 Vancomycin 750 mg IV Q 12 hrs. Goal AUC 400-550. Expected AUC: 482 SCr used: 2.45  Pharmacy will continue to follow and adjust dosing when appropriate.   Height: 5\' 6"  (167.6 cm) Weight: 88.5 kg (195 lb) IBW/kg (Calculated) : 63.8  Temp (24hrs), Avg:98.5 F (36.9 C), Min:98.2 F (36.8 C), Max:99 F (37.2 C)  Recent Labs  Lab 01/28/23 1635 01/30/23 0258 01/31/23 0513 01/31/23 2104 02/01/23 0426  WBC 13.2* 8.7 8.2  --   --   CREATININE 2.30* 2.64* 2.58*  --  2.45*  LATICACIDVEN 1.8  --   --   --   --   VANCORANDOM  --   --   --  19 12    Estimated Creatinine Clearance: 34.3 mL/min (A) (by C-G formula based on SCr of 2.45 mg/dL (H)).    No Known Allergies  Antimicrobials this admission: Cefepime 12/21 x1 Metronidazole 12/21 x1 Vancomycin 12/21, 12/23, 12/25 >>  Zosyn 12/22 >>   Microbiology results: 12/23 Wound Cx: in process 12/22 BCx: ngtd 12/22 MRSA PCR: not detected  Thank you for allowing pharmacy to be a part of this patient's care.  Otelia Sergeant, PharmD, [facility] 02/01/2023 6:43 AM

## 2023-02-01 NOTE — Plan of Care (Signed)

## 2023-02-01 NOTE — Plan of Care (Signed)

## 2023-02-02 DIAGNOSIS — E66811 Obesity, class 1: Secondary | ICD-10-CM | POA: Diagnosis not present

## 2023-02-02 DIAGNOSIS — L02619 Cutaneous abscess of unspecified foot: Secondary | ICD-10-CM | POA: Diagnosis not present

## 2023-02-02 DIAGNOSIS — I10 Essential (primary) hypertension: Secondary | ICD-10-CM | POA: Diagnosis not present

## 2023-02-02 DIAGNOSIS — D638 Anemia in other chronic diseases classified elsewhere: Secondary | ICD-10-CM | POA: Insufficient documentation

## 2023-02-02 DIAGNOSIS — N170 Acute kidney failure with tubular necrosis: Secondary | ICD-10-CM | POA: Diagnosis not present

## 2023-02-02 LAB — FERRITIN: Ferritin: 337 ng/mL — ABNORMAL HIGH (ref 24–336)

## 2023-02-02 LAB — GLUCOSE, CAPILLARY
Glucose-Capillary: 127 mg/dL — ABNORMAL HIGH (ref 70–99)
Glucose-Capillary: 189 mg/dL — ABNORMAL HIGH (ref 70–99)
Glucose-Capillary: 195 mg/dL — ABNORMAL HIGH (ref 70–99)
Glucose-Capillary: 221 mg/dL — ABNORMAL HIGH (ref 70–99)
Glucose-Capillary: 246 mg/dL — ABNORMAL HIGH (ref 70–99)

## 2023-02-02 LAB — CBC
HCT: 28.3 % — ABNORMAL LOW (ref 39.0–52.0)
Hemoglobin: 9.4 g/dL — ABNORMAL LOW (ref 13.0–17.0)
MCH: 29.1 pg (ref 26.0–34.0)
MCHC: 33.2 g/dL (ref 30.0–36.0)
MCV: 87.6 fL (ref 80.0–100.0)
Platelets: 201 10*3/uL (ref 150–400)
RBC: 3.23 MIL/uL — ABNORMAL LOW (ref 4.22–5.81)
RDW: 13.1 % (ref 11.5–15.5)
WBC: 6.8 10*3/uL (ref 4.0–10.5)
nRBC: 0 % (ref 0.0–0.2)

## 2023-02-02 LAB — BASIC METABOLIC PANEL
Anion gap: 9 (ref 5–15)
BUN: 34 mg/dL — ABNORMAL HIGH (ref 6–20)
CO2: 26 mmol/L (ref 22–32)
Calcium: 8.5 mg/dL — ABNORMAL LOW (ref 8.9–10.3)
Chloride: 99 mmol/L (ref 98–111)
Creatinine, Ser: 2.57 mg/dL — ABNORMAL HIGH (ref 0.61–1.24)
GFR, Estimated: 28 mL/min — ABNORMAL LOW (ref >60)
Glucose, Bld: 195 mg/dL — ABNORMAL HIGH (ref 70–99)
Potassium: 4.6 mmol/L (ref 3.5–5.1)
Sodium: 134 mmol/L — ABNORMAL LOW (ref 135–145)

## 2023-02-02 MED ORDER — DOXYCYCLINE HYCLATE 100 MG PO TABS
100.0000 mg | ORAL_TABLET | Freq: Two times a day (BID) | ORAL | Status: DC
Start: 2023-02-02 — End: 2023-02-02

## 2023-02-02 MED ORDER — CIPROFLOXACIN HCL 500 MG PO TABS
500.0000 mg | ORAL_TABLET | Freq: Two times a day (BID) | ORAL | Status: DC
  Filled 2023-02-02: qty 1

## 2023-02-02 MED ORDER — CIPROFLOXACIN HCL 500 MG PO TABS: 500.0000 mg | ORAL_TABLET | Freq: Two times a day (BID) | ORAL | 0 refills | Status: DC

## 2023-02-02 MED ORDER — DOXYCYCLINE HYCLATE 100 MG PO TABS: 100.0000 mg | ORAL_TABLET | Freq: Two times a day (BID) | ORAL | 0 refills | Status: DC

## 2023-02-02 NOTE — TOC Initial Note (Signed)
Transition of Care [facility]) - Initial/Assessment Note    Patient Details  Name: Jason Jennings MRN: [id number] Date of Birth: 12/18/1965  Transition of Care [facility]) CM/SW Contact:    Marlowe Sax, RN Phone Number: 02/02/2023, 12:00 PM  Clinical Narrative:                 Met with the patient at the bedside, He stated that he lives at home with his wife and has RW at home, I explained he would need [facility] RN for wound care, he does not have a preference for agencies, [facility] accepted him for [facility] RN for Wound care, added to AVS to print at DC, he stated his wife will transport him home  Expected Discharge Plan: Home w Home Health Services Barriers to Discharge: Barriers Resolved   Patient Goals and CMS Choice            Expected Discharge Plan and Services   Discharge Planning Services: CM Consult   Living arrangements for the past 2 months: Single Family Home                 DME Arranged: N/A         HH Arranged: RN HH Agency: [facility] ([facility] Date HH Agency Contacted: 02/02/23 Time HH Agency Contacted: 1159 Representative spoke with at [facility] Agency: Adele Dan  Prior Living Arrangements/Services Living arrangements for the past 2 months: Single Family Home Lives with:: Self, Spouse Patient language and need for interpreter reviewed:: Yes Do you feel safe going back to the place where you live?: Yes      Need for Family Participation in Patient Care: Yes (Comment) Care giver support system in place?: Yes (comment) Current home services: DME (rolling walker) Criminal Activity/Legal Involvement Pertinent to Current Situation/Hospitalization: No - Comment as needed  Activities of Daily Living   ADL Screening (condition at time of admission) Independently performs ADLs?: Yes (appropriate for developmental age) Is the patient deaf or have difficulty hearing?: No Does the patient have difficulty seeing, even when wearing glasses/contacts?: Yes (diabetic  retinopathy) Does the patient have difficulty concentrating, remembering, or making decisions?: No  Permission Sought/Granted   Permission granted to share information with : Yes, Verbal Permission Granted              Emotional Assessment Appearance:: Appears stated age Attitude/Demeanor/Rapport: Engaged Affect (typically observed): Accepting Orientation: : Oriented to Self, Oriented to Place, Oriented to  Time, Oriented to Situation Alcohol / Substance Use: Not Applicable Psych Involvement: No (comment)  Admission diagnosis:  Cellulitis [L03.90] Sepsis, due to unspecified organism, unspecified whether acute organ dysfunction present [facility]) [A41.9] Patient Active Problem List   Diagnosis Date Noted   Anemia, unspecified 02/01/2023   Neuropathy, diabetic (HCC) 01/31/2023   Type 2 diabetes mellitus with hyperlipidemia (HCC) 01/31/2023   Obesity, Class I, BMI 30.0-34.9 (see actual BMI) 01/31/2023   Abscess of left foot 01/30/2023   Cellulitis in diabetic foot (HCC) 01/30/2023   Cellulitis 01/28/2023   Diabetic foot ulcer (HCC) 01/28/2023   Acute renal failure superimposed on stage 3b chronic kidney disease (HCC) 01/28/2023   HTN (hypertension) 01/28/2023   Hyperparathyroidism due to renal insufficiency (HCC) 01/28/2023   H/O colon cancer, stage II 01/28/2023   PCP:  Rayburn Ma, MD Pharmacy:   [facility] [address] [place] - [address] [place] [address] Phone: [phone] Fax: [phone]     Social Drivers of Health (SDOH) Social History: SDOH Screenings  Food Insecurity: No Food Insecurity (01/29/2023)  Housing: Low Risk  (01/29/2023)  Transportation Needs: No Transportation Needs (01/29/2023)  Utilities: Not At Risk (01/29/2023)  Financial Resource Strain: Medium Risk (05/25/2020)   Received from [facility], [facility]  Physical Activity: Insufficiently Active (05/25/2020)   Received from [facility],  [facility]  Social Connections: Unknown (06/21/2021)   Received from [facility], [facility]  Stress: No Stress Concern Present (05/25/2020)   Received from [facility], [facility]  Tobacco Use: Medium Risk (01/28/2023)   SDOH Interventions:     Readmission Risk Interventions     No data to display

## 2023-02-02 NOTE — Progress Notes (Signed)
Approximately 1810--Pt discharged to home. AVS discharge instructions provided by this RN with teach-back technique utilized. All questions/ concerns answered at this time. PIVs removed with sites WDL. Dressing change performed by this RN with dressing CDI prior to pt's discharge. Pt transported home with son. All pt belongings taken with pt--pt verified.

## 2023-02-02 NOTE — Discharge Instructions (Signed)
[facility] accepted you for [facility] for wound care,  They will call you to set up when they are coming out to see you   [address], [place], [place] [address] Hours:  Open ? Closes 5?PM Phone: [phone]

## 2023-02-02 NOTE — Discharge Summary (Signed)
Physician Discharge Summary   Patient: Jason Jennings MRN: [id number] DOB: Oct 08, 1965  Admit date:     01/28/2023  Discharge date: 02/02/23  Discharge Physician: Alford Highland   PCP: Rayburn Ma, MD   Recommendations at discharge:   Follow-up PCP 5 days Follow-up podiatry next Friday  Discharge Diagnoses: Principal Problem:   Cellulitis Active Problems:   Acute renal failure superimposed on stage 3b chronic kidney disease (HCC)   Type 2 diabetes mellitus with hyperlipidemia (HCC)   HTN (hypertension)   Neuropathy, diabetic (HCC)   Obesity, Class I, BMI 30.0-34.9 (see actual BMI)   Diabetic foot ulcer (HCC)   Hyperparathyroidism due to renal insufficiency (HCC)   H/O colon cancer, stage II   Abscess of left foot   Cellulitis in diabetic foot (HCC)   Anemia of chronic disease    Hospital Course: 57 year old man past medical history of type 2 diabetes mellitus, peripheral neuropathy, hyperlipidemia, hypertension, BPH, colon cancer.  Patient presented with nausea vomiting.  He noted his lower extremity was hot to the touch.  Patient had a metal clip stuck on the ball of his foot.  Patient was started on IV antibiotics.  12/22 MRI showed marked diffuse subcutaneous soft tissue swelling edema and fluid suggesting a cellulitis.  No findings suspicious for acute septic arthritis or osteomyelitis.  12/23.  Podiatry did an incision and drainage of a superficial abscess. 12/24.  Patient has some erythema extending over the bandage from his left foot. 12/25.  Wound culture sensitivity still pending.  Staph aureus and Klebsiella pneumoniae growing out of culture. 12/26.  Case discussed with ID pharmacist and will switch antibiotics to Cipro and doxycycline upon discharge.  Assessment and Plan: * Cellulitis Left lower extremity, superficial abscess, diabetic foot ulcer.  Patient on vancomycin and Zosyn while here.  Will complete 2 weeks of antibiotics with doxycycline and  Cipro for another 10 days.  Acute renal failure superimposed on stage 3b chronic kidney disease (HCC) Creatinine 2.64 on 12/23.  Today's creatinine 2.57.  Patient CKD may have progressed to stage IV.  Recommend checking BMP as outpatient.  Type 2 diabetes mellitus with hyperlipidemia (HCC) Patient on Crestor.  Hemoglobin A1c elevated at 11.4.  Patient wanted to go back on his regimen that he is taking at home.  I will have him follow-up with his PCP.  HTN (hypertension) Continue losartan  Neuropathy, diabetic (HCC) Patient does not have feeling in his feet he must observe them or have somebody look at them on a daily basis.  Obesity, Class I, BMI 30.0-34.9 (see actual BMI) BMI 31.47  Anemia of chronic disease Ferritin elevated 337 and last hemoglobin 9.4.  Follow-up as outpatient.         Consultants: Podiatry Procedures performed: Bedside debridement by podiatry Disposition: Home health Diet recommendation:  Cardiac and Carb modified diet DISCHARGE MEDICATION: Allergies as of 02/02/2023   No Known Allergies      Medication List     TAKE these medications    ciprofloxacin 500 MG tablet Commonly known as: CIPRO Take 1 tablet (500 mg total) by mouth 2 (two) times daily for 10 days.   doxycycline 100 MG tablet Commonly known as: VIBRA-TABS Take 1 tablet (100 mg total) by mouth every 12 (twelve) hours for 10 days.   gabapentin 100 MG capsule Commonly known as: NEURONTIN Take 100 mg by mouth 3 (three) times daily.   HumaLOG Mix 75/25 KwikPen (75-25) 100 UNIT/ML KwikPen Generic drug: Insulin Lispro Prot & Lispro Inject  30 Units into the skin 2 (two) times daily with a meal. Qam and in the evening.      (He takes both Humalog mix 75/25 BID and then Lantus at bedtime)   Lantus SoloStar 100 UNIT/ML Solostar Pen Generic drug: insulin glargine Inject 14 Units into the skin daily.   losartan 50 MG tablet Commonly known as: COZAAR Take 50 mg by mouth daily.    methocarbamol 750 MG tablet Commonly known as: ROBAXIN Take 750 mg by mouth 2 (two) times daily as needed.   Oxycodone HCl 10 MG Tabs Take 10 mg by mouth every 6 (six) hours as needed.   rosuvastatin 20 MG tablet Commonly known as: CRESTOR Take 20 mg by mouth daily.   tamsulosin 0.4 MG Caps capsule Commonly known as: FLOMAX Take 0.4 mg by mouth daily.   torsemide 10 MG tablet Commonly known as: DEMADEX Take 10 mg by mouth every Monday, Wednesday, Friday, and Saturday at 6 PM.        Follow-up Information     Rayburn Ma, MD. Go on 02/03/2023.   Specialty: Internal Medicine Why: Appt @ 8:20 am.  Bring Discharge  packet from hospital to this appt. Contact information: [address] [place] [address] [phone]         Pilar Plate, DPM Follow up.   Specialty: Podiatry Why: next friday, [place] office if possible Contact information: [address] [place] [address] [phone]                Discharge Exam: Ceasar Mons Weights   01/29/23 0113  Weight: 88.5 kg   Physical Exam HENT:     Head: Normocephalic.  Eyes:     General: Lids are normal.     Conjunctiva/sclera: Conjunctivae normal.  Cardiovascular:     Rate and Rhythm: Normal rate and regular rhythm.     Heart sounds: Normal heart sounds, S1 normal and S2 normal.  Pulmonary:     Breath sounds: No decreased breath sounds, wheezing, rhonchi or rales.  Abdominal:     Palpations: Abdomen is soft.     Tenderness: There is no abdominal tenderness.  Musculoskeletal:     Right lower leg: No swelling.     Left lower leg: Swelling present.  Skin:    General: Skin is warm.     Comments: Slight erythema above bandage left foot.  Neurological:     Mental Status: He is alert and oriented to person, place, and time.      Condition at discharge: stable  The results of significant diagnostics from this hospitalization (including imaging,  microbiology, ancillary and laboratory) are listed below for reference.   Imaging Studies: MR FOOT LEFT WO CONTRAST Result Date: 01/29/2023 CLINICAL DATA:  Left foot pain and swelling.  Diabetic. EXAM: MRI OF THE LEFT FOOT WITHOUT CONTRAST TECHNIQUE: Multiplanar, multisequence MR imaging of the left foot was performed. No intravenous contrast was administered. COMPARISON:  Radiographs 01/28/2023 FINDINGS: Diffuse and marked subcutaneous soft tissue swelling/edema/fluid most notably along the dorsum the foot. No discrete fluid collection to suggest a drainable abscess. Mild diffuse myositis without findings suspicious pyomyositis. The distal interphalangeal joint of the fifth toe is abnormal. This is most likely a chronic arthropathic process possibly related to prior trauma or prior septic arthritis. I do not see any surrounding inflammatory changes to suggest this is acute septic arthritis. Mild degenerative changes are noted at the first MTP joint. No findings suspicious for acute septic arthritis  or osteomyelitis. The major tendons and ligaments appear intact. IMPRESSION: 1. Diffuse and marked subcutaneous soft tissue swelling/edema/fluid most notably along the dorsum of the foot consistent with cellulitis. No discrete fluid collection to suggest a drainable abscess. 2. Mild diffuse myositis without findings suspicious for pyomyositis. 3. Abnormal distal interphalangeal joint of the fifth toe is most likely a chronic arthropathic process possibly related to prior trauma or prior septic arthritis. Do not see any surrounding inflammatory changes to suggest this is acute septic arthritis. 4. No findings suspicious for acute septic arthritis or osteomyelitis. Electronically Signed   By: Rudie Meyer M.D.   On: 01/29/2023 08:49   CT ABDOMEN PELVIS W CONTRAST Result Date: 01/28/2023 CLINICAL DATA:  Sepsis EXAM: CT ABDOMEN AND PELVIS WITH CONTRAST TECHNIQUE: Multidetector CT imaging of the abdomen and pelvis  was performed using the standard protocol following bolus administration of intravenous contrast. RADIATION DOSE REDUCTION: This exam was performed according to the departmental dose-optimization program which includes automated exposure control, adjustment of the mA and/or kV according to patient size and/or use of iterative reconstruction technique. CONTRAST:  75mL OMNIPAQUE IOHEXOL 300 MG/ML  SOLN COMPARISON:  None Available. FINDINGS: Lower chest: No acute abnormality. Coronary artery calcifications in the visualized right coronary artery. Hepatobiliary: No focal hepatic abnormality. Gallbladder unremarkable. Pancreas: No focal abnormality or ductal dilatation. Spleen: 2.4 cm low-density lesion off the anterior tip of the spleen, most compatible with cyst. Normal size. Adrenals/Urinary Tract: No adrenal abnormality. No focal renal abnormality. No stones or hydronephrosis. Urinary bladder is unremarkable. Stomach/Bowel: Postoperative changes with left lower quadrant ostomy. Soft tissue in the presacral space, likely postoperative scarring. Moderate stool burden in the colon. No bowel obstruction. Vascular/Lymphatic: Aortic atherosclerosis. No evidence of aneurysm or adenopathy. Reproductive: No visible focal abnormality. Other: No free fluid or free air. Musculoskeletal: No acute bony abnormality. IMPRESSION: No acute findings in the abdomen or pelvis. Moderate stool burden in the colon. Left lower quadrant ostomy. No bowel obstruction. Aortic atherosclerosis, coronary artery disease. Electronically Signed   By: Charlett Nose M.D.   On: 01/28/2023 21:30   DG Foot Complete Left Result Date: 01/28/2023 CLINICAL DATA:  Redness, swelling.  Evaluate for osteomyelitis. EXAM: LEFT FOOT - COMPLETE 3+ VIEW COMPARISON:  None Available. FINDINGS: No acute bony abnormality. Specifically, no fracture, subluxation, or dislocation. No bone destruction to suggest osteomyelitis. Joint spaces maintained. IMPRESSION: No acute  bony abnormality. Electronically Signed   By: Charlett Nose M.D.   On: 01/28/2023 20:59    Microbiology: Results for orders placed or performed during the hospital encounter of 01/28/23  Resp panel by RT-PCR (RSV, Flu A&B, Covid) Anterior Nasal Swab     Status: None   Collection Time: 01/28/23  8:34 PM   Specimen: Anterior Nasal Swab  Result Value Ref Range Status   SARS Coronavirus 2 by RT PCR NEGATIVE NEGATIVE Final    Comment: (NOTE) SARS-CoV-2 target nucleic acids are NOT DETECTED.  The SARS-CoV-2 RNA is generally detectable in upper respiratory specimens during the acute phase of infection. The lowest concentration of SARS-CoV-2 viral copies this assay can detect is 138 copies/mL. A negative result does not preclude SARS-Cov-2 infection and should not be used as the sole basis for treatment or other patient management decisions. A negative result may occur with  improper specimen collection/handling, submission of specimen other than nasopharyngeal swab, presence of viral mutation(s) within the areas targeted by this assay, and inadequate number of viral copies(<138 copies/mL). A negative result must be combined with  clinical observations, patient history, and epidemiological information. The expected result is Negative.  Fact Sheet for Patients:  BloggerCourse.com  Fact Sheet for Healthcare Providers:  SeriousBroker.it  This test is no t yet approved or cleared by the [place] FDA and  has been authorized for detection and/or diagnosis of SARS-CoV-2 by FDA under an Emergency Use Authorization (EUA). This EUA will remain  in effect (meaning this test can be used) for the duration of the COVID-19 declaration under Section 564(b)(1) of the Act, 21 U.S.C.section 360bbb-3(b)(1), unless the authorization is terminated  or revoked sooner.       Influenza A by PCR NEGATIVE NEGATIVE Final   Influenza B by PCR NEGATIVE  NEGATIVE Final    Comment: (NOTE) The Xpert Xpress SARS-CoV-2/FLU/RSV plus assay is intended as an aid in the diagnosis of influenza from Nasopharyngeal swab specimens and should not be used as a sole basis for treatment. Nasal washings and aspirates are unacceptable for Xpert Xpress SARS-CoV-2/FLU/RSV testing.  Fact Sheet for Patients: BloggerCourse.com  Fact Sheet for Healthcare Providers: SeriousBroker.it  This test is not yet approved or cleared by the [place] FDA and has been authorized for detection and/or diagnosis of SARS-CoV-2 by FDA under an Emergency Use Authorization (EUA). This EUA will remain in effect (meaning this test can be used) for the duration of the COVID-19 declaration under Section 564(b)(1) of the Act, 21 U.S.C. section 360bbb-3(b)(1), unless the authorization is terminated or revoked.     Resp Syncytial Virus by PCR NEGATIVE NEGATIVE Final    Comment: (NOTE) Fact Sheet for Patients: BloggerCourse.com  Fact Sheet for Healthcare Providers: SeriousBroker.it  This test is not yet approved or cleared by the [place] FDA and has been authorized for detection and/or diagnosis of SARS-CoV-2 by FDA under an Emergency Use Authorization (EUA). This EUA will remain in effect (meaning this test can be used) for the duration of the COVID-19 declaration under Section 564(b)(1) of the Act, 21 U.S.C. section 360bbb-3(b)(1), unless the authorization is terminated or revoked.  Performed at [facility], [address] [place], [place] [address]   MRSA Next Gen by PCR, Nasal     Status: None   Collection Time: 01/29/23  2:23 AM   Specimen: Nasal Mucosa; Nasal Swab  Result Value Ref Range Status   MRSA by PCR Next Gen NOT DETECTED NOT DETECTED Final    Comment: (NOTE) The GeneXpert MRSA Assay (FDA approved for NASAL specimens only), is one  component of a comprehensive MRSA colonization surveillance program. It is not intended to diagnose MRSA infection nor to guide or monitor treatment for MRSA infections. Test performance is not FDA approved in patients less than 37 years old. Performed at [facility], [address] [place], [place] [address]   Culture, blood (Routine X 2) w Reflex to ID Panel     Status: None (Preliminary result)   Collection Time: 01/29/23  2:25 AM   Specimen: BLOOD  Result Value Ref Range Status   Specimen Description BLOOD BLOOD RIGHT HAND  Final   Special Requests   Final    BOTTLES DRAWN AEROBIC AND ANAEROBIC Blood Culture adequate volume   Culture   Final    NO GROWTH 4 DAYS Performed at [facility], [address] [place], [place] [address]    Report Status PENDING  Incomplete  Culture, blood (Routine X 2) w Reflex to ID Panel     Status: None (Preliminary result)   Collection Time: 01/29/23  2:25 AM   Specimen: BLOOD  Result Value Ref Range Status   Specimen Description BLOOD RIGHT ANTECUBITAL  Final   Special Requests   Final    BOTTLES DRAWN AEROBIC AND ANAEROBIC Blood Culture adequate volume   Culture   Final    NO GROWTH 4 DAYS Performed at [facility], [address] [place], [place] [address]    Report Status PENDING  Incomplete  Aerobic/Anaerobic Culture w Gram Stain (surgical/deep wound)     Status: None (Preliminary result)   Collection Time: 01/30/23  1:48 PM   Specimen: Foot; Abscess  Result Value Ref Range Status   Specimen Description   Final    FOOT Performed at [facility], [address] [place], [place] [address]    Special Requests   Final    LEFT FOOT Performed at [facility], [address] [place], [place] [address]    Gram Stain   Final    NO WBC SEEN FEW GRAM POSITIVE COCCI IN PAIRS FEW GRAM NEGATIVE RODS    Culture   Final    ABUNDANT KLEBSIELLA PNEUMONIAE ABUNDANT STAPHYLOCOCCUS AUREUS NO  ANAEROBES ISOLATED; CULTURE IN PROGRESS FOR 5 DAYS WITHIN MIXED CULTURE CALL FOR FURTHER WORK UP [phone] Performed at [facility], [address], [place], [place] [address]    Report Status PENDING  Incomplete   Organism ID, Bacteria KLEBSIELLA PNEUMONIAE  Final   Organism ID, Bacteria STAPHYLOCOCCUS AUREUS  Final      Susceptibility   Klebsiella pneumoniae - MIC*    AMPICILLIN >=32 RESISTANT Resistant     CEFEPIME <=0.12 SENSITIVE Sensitive     CEFTAZIDIME <=1 SENSITIVE Sensitive     CEFTRIAXONE <=0.25 SENSITIVE Sensitive     CIPROFLOXACIN <=0.25 SENSITIVE Sensitive     GENTAMICIN <=1 SENSITIVE Sensitive     IMIPENEM <=0.25 SENSITIVE Sensitive     TRIMETH/SULFA <=20 SENSITIVE Sensitive     AMPICILLIN/SULBACTAM >=32 RESISTANT Resistant     PIP/TAZO >=128 RESISTANT Resistant ug/mL    * ABUNDANT KLEBSIELLA PNEUMONIAE   Staphylococcus aureus - MIC*    CIPROFLOXACIN <=0.5 SENSITIVE Sensitive     ERYTHROMYCIN RESISTANT Resistant     GENTAMICIN <=0.5 SENSITIVE Sensitive     OXACILLIN <=0.25 SENSITIVE Sensitive     TETRACYCLINE <=1 SENSITIVE Sensitive     VANCOMYCIN 1 SENSITIVE Sensitive     TRIMETH/SULFA <=10 SENSITIVE Sensitive     CLINDAMYCIN RESISTANT Resistant     RIFAMPIN <=0.5 SENSITIVE Sensitive     Inducible Clindamycin POSITIVE Resistant     LINEZOLID 2 SENSITIVE Sensitive     * ABUNDANT STAPHYLOCOCCUS AUREUS    Labs: CBC: Recent Labs  Lab 01/28/23 1635 01/30/23 0258 01/31/23 0513 02/02/23 0414  WBC 13.2* 8.7 8.2 6.8  HGB 11.6* 9.2* 9.7* 9.4*  HCT 34.3* 27.5* 28.0* 28.3*  MCV 86.4 87.6 85.9 87.6  PLT 157 144* 159 201   Basic Metabolic Panel: Recent Labs  Lab 01/28/23 1635 01/30/23 0258 01/31/23 0513 02/01/23 0426 02/02/23 0414  NA 132* 136 135 137 134*  K 3.7 3.4* 3.3* 3.7 4.6  CL 95* 101 101 102 99  CO2 26 25 23 25 26   GLUCOSE 347* 193* 110* 183* 195*  BUN 35* 37* 35* 35* 34*  CREATININE 2.30* 2.64* 2.58* 2.45* 2.57*  CALCIUM 8.9 8.2*  8.3* 8.5* 8.5*  MG  --   --  2.3  --   --    Liver Function Tests: Recent Labs  Lab 01/28/23 1635  AST 21  ALT 11  ALKPHOS 76  BILITOT 0.8  PROT 6.5  ALBUMIN 3.1*   CBG: Recent Labs  Lab 02/02/23 0007 02/02/23 0450 02/02/23 0736 02/02/23 0948 02/02/23 1135  GLUCAP 195* 189* 127* 221* 246*    Discharge time spent: greater than 30 minutes.  Signed: Alford Highland, MD [facility] 02/02/2023

## 2023-02-02 NOTE — Plan of Care (Signed)
  Problem: Education: Goal: Ability to describe self-care measures that may prevent or decrease complications (Diabetes Survival Skills Education) will improve Outcome: Progressing   Problem: Health Behavior/Discharge Planning: Goal: Ability to identify and utilize available resources and services will improve Outcome: Progressing Goal: Ability to manage health-related needs will improve Outcome: Progressing   Problem: Nutritional: Goal: Maintenance of adequate nutrition will improve Outcome: Progressing   Problem: Skin Integrity: Goal: Risk for impaired skin integrity will decrease Outcome: Progressing   Problem: Education: Goal: Knowledge of General Education information will improve Description: Including pain rating scale, medication(s)/side effects and non-pharmacologic comfort measures Outcome: Progressing

## 2023-02-02 NOTE — Progress Notes (Signed)
Mobility Specialist - Progress Note   02/02/23 1522  Mobility  Activity Ambulated with assistance in hallway  Level of Assistance Standby assist, set-up cues, supervision of patient - no hands on  Assistive Device Front wheel walker  Distance Ambulated (ft) 160 ft  LLE Weight Bearing Per Provider Order WBAT  Activity Response Tolerated well  Mobility visit 1 Mobility  Mobility Specialist Start Time (ACUTE ONLY) 1436  Mobility Specialist Stop Time (ACUTE ONLY) 1454  Mobility Specialist Time Calculation (min) (ACUTE ONLY) 18 min   Pt seated in the recliner upon entry, utilizing RA. Pt motivated and agreeable to OOB amb in the hallway this date. Pt dons R shoe indep, STS and amb one lap around the NS with supervision. Pt denied pain, expressing " it may take time for me to know" when asked about pain in foot after amb. Pt returned to the room, left seated in the recliner with needs within reach. RN notified.   Zetta Bills Mobility Specialist 02/02/23 3:27 PM

## 2023-02-02 NOTE — Progress Notes (Signed)
  Subjective:  Patient ID: Jason Jennings, male    DOB: February 01, 1966,  MRN: [id number]  DOS: 01/30/23 Procedure: Left foot incision and drainage of superficial abscess, packed open  57 y.o. male seen for post op check. POD 3 s/p bedside I&D. Denies pain. WbC improving. He is doing well denies any sytemic signs/symptoms of infection.   Review of Systems: Negative except as noted in the HPI. Denies N/V/F/Ch.   Objective:   Vitals:   02/02/23 0005 02/02/23 0739  BP: 121/65 (!) 145/74  Pulse: 62 64  Resp: 17 14  Temp: 98.3 F (36.8 C) 98.3 F (36.8 C)  SpO2: 97% 100%   Body mass index is 31.47 kg/m. Constitutional Well developed. Well nourished.  Vascular Foot warm and well perfused. Capillary refill normal to all digits.   No calf pain with palpation  Neurologic Normal speech. Oriented to person, place, and time. Epicritic sensation absent  Dermatologic Dressing C/D/I   Orthopedic: Decreased edema to LLE   Radiographs: No osteomyelitis or deep abscess seen on prior MRI  Pathology: NA  Micro: K. Pneumonia and Staph arueus.   Assessment:   1. Sepsis, due to unspecified organism, unspecified whether acute organ dysfunction present [facility])    Plan:  Patient was evaluated and treated and all questions answered.  POD # 2 s/p Left foot I&D of superficial abscess, packed open - Doing well today. Cultures resulted. Abx selected. Pt stable for DC. Plan to DC today with home health.    -XR: deferred new imaging  -WB Status: WBAT in  post op shoe -Sutures: none. -Medications/ABX:  Plan for targeted abx doxy and cipro x 2 weeks.  -Orders were placed  for home health RN for dressing changes 3x weekly - CM to assist, messaged today. First change Saturday.  -Ok for dc from my standpoint.         Corinna Gab, DPM [facility]

## 2023-02-02 NOTE — Assessment & Plan Note (Signed)
Ferritin elevated 337 and last hemoglobin 9.4.  Follow-up as outpatient.

## 2023-02-02 NOTE — Plan of Care (Signed)

## 2023-02-03 LAB — CULTURE, BLOOD (ROUTINE X 2)
Culture: NO GROWTH
Culture: NO GROWTH
Special Requests: ADEQUATE
Special Requests: ADEQUATE

## 2023-02-04 LAB — AEROBIC/ANAEROBIC CULTURE W GRAM STAIN (SURGICAL/DEEP WOUND): Gram Stain: NONE SEEN

## 2023-02-06 ENCOUNTER — Telehealth: Payer: Self-pay | Admitting: Podiatry

## 2023-02-06 NOTE — Telephone Encounter (Signed)
Marchelle Folks from [facility] called stating she needs Verbal orders for this pt -orders--RN 3X a week X 8 weeks and 2 as needed for Wound Care. Please call Marchelle Folks with verbal orders [phone].

## 2023-02-10 ENCOUNTER — Encounter: Payer: Self-pay | Admitting: Podiatry

## 2023-02-10 ENCOUNTER — Ambulatory Visit (INDEPENDENT_AMBULATORY_CARE_PROVIDER_SITE_OTHER): Payer: Medicare Other | Admitting: Podiatry

## 2023-02-10 DIAGNOSIS — E119 Type 2 diabetes mellitus without complications: Secondary | ICD-10-CM | POA: Diagnosis not present

## 2023-02-10 DIAGNOSIS — E0843 Diabetes mellitus due to underlying condition with diabetic autonomic (poly)neuropathy: Secondary | ICD-10-CM

## 2023-02-10 DIAGNOSIS — L97522 Non-pressure chronic ulcer of other part of left foot with fat layer exposed: Secondary | ICD-10-CM

## 2023-02-10 MED ORDER — DOXYCYCLINE HYCLATE 100 MG PO TABS: 100.0000 mg | ORAL_TABLET | Freq: Two times a day (BID) | ORAL | 0 refills | Status: AC

## 2023-02-10 MED ORDER — CIPROFLOXACIN HCL 500 MG PO TABS: 500.0000 mg | ORAL_TABLET | Freq: Two times a day (BID) | ORAL | 0 refills | Status: AC

## 2023-02-10 NOTE — Progress Notes (Signed)
 Chief Complaint  Patient presents with   Routine Post Op    POV#1 DOS 01/30/2023  I&D LEFT It's not doing good.  It seems to have gotten worse.  The nurses are concerned.  I'm taking antibiotics.    HPI: 58 y.o. male PMHx diabetes mellitus; uncontrolled; last A1c on 01/28/2023 was 11.4; presenting today to our office as a new patient being managed inpatient at the [facility] hospital with Dr. Malvin within our group.  Recent incision and drainage of the left foot performed at bedside.  Date of procedure: 01/30/2023.  Currently on ciprofloxacin  500 mg as well as doxycycline  100 mg.  He continues to have redness with swelling to the forefoot.  Past Medical History:  Diagnosis Date   Cancer (HCC)    Diabetes mellitus without complication (HCC)    Neuropathy     Past Surgical History:  Procedure Laterality Date   COLON SURGERY      No Known Allergies    LT foot 02/10/2023  LT foot 02/10/2023  Physical Exam: General: The patient is alert and oriented x3 in no acute distress.  Dermatology: Skin is warm, dry and supple bilateral lower extremities.   Vascular: Erythema and edema noted to the left forefoot.  There does appear to be some delayed capillary refill.  Hard to palpate pedal pulses.  No history of noninvasive vascular studies  Neurological: Light touch and protective threshold diminished  Musculoskeletal Exam: No pedal deformities noted.  No prior amputations  DG Foot Complete Left 01/28/2023 FINDINGS: No acute bony abnormality. Specifically, no fracture, subluxation, or dislocation. No bone destruction to suggest osteomyelitis. Joint spaces maintained. IMPRESSION: No acute bony abnormality.  MR FOOT LEFT WO CONTRAST 01/29/2023 IMPRESSION: 1. Diffuse and marked subcutaneous soft tissue swelling/edema/fluid most notably along the dorsum of the foot consistent with cellulitis. No discrete fluid collection to suggest a drainable abscess. 2. Mild diffuse myositis without  findings suspicious for pyomyositis. 3. Abnormal distal interphalangeal joint of the fifth toe is most likely a chronic arthropathic process possibly related to prior trauma or prior septic arthritis. Do not see any surrounding inflammatory changes to suggest this is acute septic arthritis. 4. No findings suspicious for acute septic arthritis or osteomyelitis.    Assessment/Plan of Care: 1.  Ulcer plantar aspect of the fourth MTP 2.  Diabetes mellitus with peripheral polyneuropathy; uncontrolled 3.  Cellulitis left forefoot 4. S/p bedside I&D LT foot [facility] hospital 01/30/2023  -Patient evaluated.  Chart reviewed -Stressed the importance of working to manage his uncontrolled diabetes. -Medically necessary excisional debridement including subcutaneous tissue was performed today using a tissue nipper.  Excisional debridement of the necrotic nonviable tissue down to healthier bleeding viable tissue was performed with postoperative measurement same as pre- -Continue oral antibiotics as prescribed at discharge.  Refills provided -No history of prior NIVS. ABIs ordered -Stressed the importance of reducing activity and getting pressure off of the foot.  Continue minimal WBAT surgical shoe -Patient currently having [facility] coming to the home 3x/week for dressing changes.  Continue as ordered -Return to clinic 2 weeks       Thresa EMERSON Sar, DPM [facility]  Dr. Thresa EMERSON Sar, DPM    [address], [place] [address]  Office [phone]  Fax [phone]

## 2023-02-28 ENCOUNTER — Encounter: Payer: Self-pay | Admitting: Podiatry

## 2023-02-28 ENCOUNTER — Ambulatory Visit (INDEPENDENT_AMBULATORY_CARE_PROVIDER_SITE_OTHER): Payer: Medicare Other | Admitting: Podiatry

## 2023-02-28 ENCOUNTER — Ambulatory Visit (INDEPENDENT_AMBULATORY_CARE_PROVIDER_SITE_OTHER): Payer: Medicare Other

## 2023-02-28 DIAGNOSIS — L97522 Non-pressure chronic ulcer of other part of left foot with fat layer exposed: Secondary | ICD-10-CM | POA: Diagnosis not present

## 2023-02-28 DIAGNOSIS — E0843 Diabetes mellitus due to underlying condition with diabetic autonomic (poly)neuropathy: Secondary | ICD-10-CM | POA: Diagnosis not present

## 2023-02-28 MED ORDER — CIPROFLOXACIN HCL 500 MG PO TABS: 500.0000 mg | ORAL_TABLET | Freq: Two times a day (BID) | ORAL | 0 refills | Status: AC

## 2023-02-28 MED ORDER — DOXYCYCLINE HYCLATE 100 MG PO TABS: 100.0000 mg | ORAL_TABLET | Freq: Two times a day (BID) | ORAL | 0 refills | Status: DC

## 2023-02-28 NOTE — Progress Notes (Signed)
Chief Complaint  Patient presents with   Routine Post Op    POV#2 DOS 01/30/2023  I&D LEFT "I don't think it's been healing very well."    HPI: 58 y.o. male PMHx diabetes mellitus; uncontrolled; last A1c on 01/28/2023 was 11.4; presenting today to our office for follow-up patient of abscess with ulcer to the left forefoot.  Patient completed the oral antibiotics.  Tolerated them well without complication.  [facility] is coming to his home MWF for dressing changes.  No new complaints   Brief history: Managed inpatient at the [facility] hospital with Dr. Annamary Rummage within our group.  Recent incision and drainage of the left foot performed at bedside.  Date of procedure: 01/30/2023.   Past Medical History:  Diagnosis Date   Cancer (HCC)    Diabetes mellitus without complication (HCC)    Neuropathy     Past Surgical History:  Procedure Laterality Date   COLON SURGERY      No Known Allergies    LT foot 02/10/2023  LT foot 02/10/2023  Physical Exam: General: The patient is alert and oriented x3 in no acute distress.  Dermatology: Overall improvement.  Skin is warm, dry and supple bilateral lower extremities.  There continues to be ulcer to the plantar aspect of the left foot but the drainage has reduced significantly.  Significant amount of peeling skin as well to the forefoot.  Vascular: Significant reduction of the erythema and edema noted to the left forefoot.   VAS [place] ABI WITH/WO TBI 02/28/2023 Exam ended: Pending final read  Neurological: Light touch and protective threshold diminished  Musculoskeletal Exam: No pedal deformities noted.  No prior amputations.  Patient ambulatory  DG Foot Complete Left 01/28/2023 FINDINGS: No acute bony abnormality. Specifically, no fracture, subluxation, or dislocation. No bone destruction to suggest osteomyelitis. Joint spaces maintained. IMPRESSION: No acute bony abnormality.  MR FOOT LEFT WO CONTRAST 01/29/2023 IMPRESSION: 1.  Diffuse and marked subcutaneous soft tissue swelling/edema/fluid most notably along the dorsum of the foot consistent with cellulitis. No discrete fluid collection to suggest a drainable abscess. 2. Mild diffuse myositis without findings suspicious for pyomyositis. 3. Abnormal distal interphalangeal joint of the fifth toe is most likely a chronic arthropathic process possibly related to prior trauma or prior septic arthritis. Do not see any surrounding inflammatory changes to suggest this is acute septic arthritis. 4. No findings suspicious for acute septic arthritis or osteomyelitis.    Assessment/Plan of Care: 1.  Ulcer plantar aspect of the fourth MTP 2.  Diabetes mellitus with peripheral polyneuropathy; uncontrolled 3.  Cellulitis left forefoot 4. S/p bedside I&D LT foot [facility] hospital 01/30/2023  -Patient evaluated.   -Refill prescription for the ciprofloxacin 500 mg as well as the doxycycline 100 mg x 10 days - I do believe the patient would benefit from evaluation at the wound care center.  Always appreciated.  Referral placed -Medically necessary excisional debridement including subcutaneous tissue was performed today using a tissue nipper.  Excisional debridement of the necrotic nonviable tissue down to healthier bleeding viable tissue was performed with postoperative measurement same as pre- -Continue minimal WBAT surgical shoe -Patient currently having [facility] coming to the home 3x/week for dressing changes.  Continue as ordered -Return to clinic 2 weeks       Felecia Shelling, DPM [facility]  Dr. Felecia Shelling, DPM    [address]  [place], [place] [address]                Office [phone]  Fax [phone]

## 2023-03-01 ENCOUNTER — Encounter: Payer: Medicare Other | Attending: Physician Assistant | Admitting: Physician Assistant

## 2023-03-01 DIAGNOSIS — I129 Hypertensive chronic kidney disease with stage 1 through stage 4 chronic kidney disease, or unspecified chronic kidney disease: Secondary | ICD-10-CM | POA: Insufficient documentation

## 2023-03-01 DIAGNOSIS — E11621 Type 2 diabetes mellitus with foot ulcer: Secondary | ICD-10-CM | POA: Diagnosis present

## 2023-03-01 DIAGNOSIS — Z87891 Personal history of nicotine dependence: Secondary | ICD-10-CM | POA: Insufficient documentation

## 2023-03-01 DIAGNOSIS — E1122 Type 2 diabetes mellitus with diabetic chronic kidney disease: Secondary | ICD-10-CM | POA: Diagnosis not present

## 2023-03-01 DIAGNOSIS — E1143 Type 2 diabetes mellitus with diabetic autonomic (poly)neuropathy: Secondary | ICD-10-CM | POA: Diagnosis not present

## 2023-03-01 DIAGNOSIS — L97522 Non-pressure chronic ulcer of other part of left foot with fat layer exposed: Secondary | ICD-10-CM | POA: Insufficient documentation

## 2023-03-01 DIAGNOSIS — N1832 Chronic kidney disease, stage 3b: Secondary | ICD-10-CM | POA: Insufficient documentation

## 2023-03-08 ENCOUNTER — Encounter: Payer: Medicare Other | Admitting: Physician Assistant

## 2023-03-08 DIAGNOSIS — E11621 Type 2 diabetes mellitus with foot ulcer: Secondary | ICD-10-CM | POA: Diagnosis not present

## 2023-03-15 ENCOUNTER — Encounter: Payer: Medicare Other | Attending: Physician Assistant | Admitting: Physician Assistant

## 2023-03-15 DIAGNOSIS — L97522 Non-pressure chronic ulcer of other part of left foot with fat layer exposed: Secondary | ICD-10-CM | POA: Diagnosis not present

## 2023-03-15 DIAGNOSIS — E1122 Type 2 diabetes mellitus with diabetic chronic kidney disease: Secondary | ICD-10-CM | POA: Diagnosis not present

## 2023-03-15 DIAGNOSIS — E1143 Type 2 diabetes mellitus with diabetic autonomic (poly)neuropathy: Secondary | ICD-10-CM | POA: Diagnosis not present

## 2023-03-15 DIAGNOSIS — N1832 Chronic kidney disease, stage 3b: Secondary | ICD-10-CM | POA: Diagnosis not present

## 2023-03-15 DIAGNOSIS — I129 Hypertensive chronic kidney disease with stage 1 through stage 4 chronic kidney disease, or unspecified chronic kidney disease: Secondary | ICD-10-CM | POA: Insufficient documentation

## 2023-03-15 DIAGNOSIS — E11621 Type 2 diabetes mellitus with foot ulcer: Secondary | ICD-10-CM | POA: Insufficient documentation

## 2023-03-17 ENCOUNTER — Ambulatory Visit: Payer: Medicare Other | Admitting: Podiatry

## 2023-03-17 ENCOUNTER — Encounter: Payer: Self-pay | Admitting: Podiatry

## 2023-03-17 DIAGNOSIS — E0843 Diabetes mellitus due to underlying condition with diabetic autonomic (poly)neuropathy: Secondary | ICD-10-CM | POA: Diagnosis not present

## 2023-03-17 DIAGNOSIS — L03116 Cellulitis of left lower limb: Secondary | ICD-10-CM

## 2023-03-17 DIAGNOSIS — L97522 Non-pressure chronic ulcer of other part of left foot with fat layer exposed: Secondary | ICD-10-CM | POA: Diagnosis not present

## 2023-03-17 NOTE — Progress Notes (Signed)
 Chief Complaint  Patient presents with   Routine Post Op    I&D LT foot [facility] hospital 01/30/2023 It's healing but not as fast as I would like.    HPI: 58 y.o. male PMHx diabetes mellitus; uncontrolled; last A1c on 01/28/2023 was 11.4; presenting today to our office for follow-up patient of an ulcer to the plantar aspect of the left forefoot.  He is now going to the wound care center weekly.  He also has home health nurse dressing changes 2x/wk. presenting for further treatment and evaluation   Brief history: Managed inpatient at the [facility] hospital with Dr. Malvin within our group.  Recent incision and drainage of the left foot performed at bedside.  Date of procedure: 01/30/2023.   Past Medical History:  Diagnosis Date   Cancer (HCC)    Diabetes mellitus without complication (HCC)    Neuropathy     Past Surgical History:  Procedure Laterality Date   COLON SURGERY      No Known Allergies    LT foot 02/10/2023  LT foot 02/10/2023   LT foot 03/17/2023  Physical Exam: General: The patient is alert and oriented x3 in no acute distress.  Dermatology: Continued improvement.  Ulcer noted to the plantar aspect of the fourth MTP of the left foot as well as the distal tip to the left fourth toe.  Plantar fourth MTP wound measures approximately 1.2 x 1.0 x 1.0 cm.  There is some undermining to the distal portion of the wound at 12:00. Ulcer also noted to the distal tip of the left fourth toe measuring approximately 0.2 x 0.2 x 0.1 cm.  Scant drainage noted.  Healthy bleeding with debridement.  No malodor.  Vascular: Significant reduction of the erythema and edema noted to the left forefoot.   VAS US  ABI WITH/WO TBI 02/28/2023 Exam ended: Pending final read  Neurological: Light touch and protective threshold diminished  Musculoskeletal Exam: No pedal deformities noted.  No prior amputations.  Patient ambulatory  DG Foot Complete Left 01/28/2023 FINDINGS: No acute bony  abnormality. Specifically, no fracture, subluxation, or dislocation. No bone destruction to suggest osteomyelitis. Joint spaces maintained. IMPRESSION: No acute bony abnormality.  MR FOOT LEFT WO CONTRAST 01/29/2023 IMPRESSION: 1. Diffuse and marked subcutaneous soft tissue swelling/edema/fluid most notably along the dorsum of the foot consistent with cellulitis. No discrete fluid collection to suggest a drainable abscess. 2. Mild diffuse myositis without findings suspicious for pyomyositis. 3. Abnormal distal interphalangeal joint of the fifth toe is most likely a chronic arthropathic process possibly related to prior trauma or prior septic arthritis. Do not see any surrounding inflammatory changes to suggest this is acute septic arthritis. 4. No findings suspicious for acute septic arthritis or osteomyelitis.    Assessment/Plan of Care: 1.  Ulcer plantar aspect of the fourth MTP 2.  Diabetes mellitus with peripheral polyneuropathy; uncontrolled 3.  Cellulitis left forefoot 4. S/p bedside I&D LT foot [facility] hospital 01/30/2023  -Patient evaluated.   -The cellulitis to the left forefoot appears significantly improved.  Significant reduction of the erythema and edema to the foot. -Patient completed both the oral ciprofloxacin  and doxycycline  as prescribed -Continue weekly management at the [facility] wound care center.  For now we will let the wound care center manage to the wound -Continue home health nurse dressing changes 2x/week -Continue WBAT surgical shoe -Return to clinic with me in 3 months for routine footcare or sooner if recommended by the wound care center     [facility]  Janit, DPM [facility]  Dr. Thresa EMERSON Janit, DPM    [address] [place], [place] [address]                Office [phone]  Fax [phone]

## 2023-03-23 ENCOUNTER — Encounter: Payer: Medicare Other | Admitting: Physician Assistant

## 2023-03-23 DIAGNOSIS — E11621 Type 2 diabetes mellitus with foot ulcer: Secondary | ICD-10-CM | POA: Diagnosis not present

## 2023-03-26 ENCOUNTER — Other Ambulatory Visit: Payer: Self-pay

## 2023-03-26 ENCOUNTER — Emergency Department: Payer: Medicare Other

## 2023-03-26 ENCOUNTER — Emergency Department
Admission: EM | Admit: 2023-03-26 | Discharge: 2023-03-27 | Disposition: A | Discharge status: Home / Self Care | Payer: Medicare Other | Attending: Emergency Medicine | Admitting: Emergency Medicine

## 2023-03-26 DIAGNOSIS — S161XXA Strain of muscle, fascia and tendon at neck level, initial encounter: Secondary | ICD-10-CM | POA: Diagnosis not present

## 2023-03-26 DIAGNOSIS — S0990XA Unspecified injury of head, initial encounter: Secondary | ICD-10-CM | POA: Diagnosis not present

## 2023-03-26 DIAGNOSIS — I2510 Atherosclerotic heart disease of native coronary artery without angina pectoris: Secondary | ICD-10-CM | POA: Diagnosis not present

## 2023-03-26 DIAGNOSIS — M545 Low back pain, unspecified: Secondary | ICD-10-CM | POA: Insufficient documentation

## 2023-03-26 DIAGNOSIS — I700 Atherosclerosis of aorta: Secondary | ICD-10-CM | POA: Diagnosis not present

## 2023-03-26 DIAGNOSIS — M47812 Spondylosis without myelopathy or radiculopathy, cervical region: Secondary | ICD-10-CM | POA: Diagnosis not present

## 2023-03-26 DIAGNOSIS — M546 Pain in thoracic spine: Secondary | ICD-10-CM | POA: Diagnosis not present

## 2023-03-26 DIAGNOSIS — R109 Unspecified abdominal pain: Secondary | ICD-10-CM | POA: Insufficient documentation

## 2023-03-26 DIAGNOSIS — E119 Type 2 diabetes mellitus without complications: Secondary | ICD-10-CM | POA: Diagnosis not present

## 2023-03-26 DIAGNOSIS — Y92410 Unspecified street and highway as the place of occurrence of the external cause: Secondary | ICD-10-CM | POA: Insufficient documentation

## 2023-03-26 DIAGNOSIS — Z794 Long term (current) use of insulin: Secondary | ICD-10-CM | POA: Diagnosis not present

## 2023-03-26 DIAGNOSIS — M542 Cervicalgia: Secondary | ICD-10-CM | POA: Diagnosis present

## 2023-03-26 LAB — COMPREHENSIVE METABOLIC PANEL
ALT: 14 U/L (ref 0–44)
AST: 26 U/L (ref 15–41)
Albumin: 3.4 g/dL — ABNORMAL LOW (ref 3.5–5.0)
Alkaline Phosphatase: 57 U/L (ref 38–126)
Anion gap: 13 (ref 5–15)
BUN: 39 mg/dL — ABNORMAL HIGH (ref 6–20)
CO2: 24 mmol/L (ref 22–32)
Calcium: 9.2 mg/dL (ref 8.9–10.3)
Chloride: 104 mmol/L (ref 98–111)
Creatinine, Ser: 2.14 mg/dL — ABNORMAL HIGH (ref 0.61–1.24)
GFR, Estimated: 35 mL/min — ABNORMAL LOW (ref >60)
Glucose, Bld: 123 mg/dL — ABNORMAL HIGH (ref 70–99)
Potassium: 3.4 mmol/L — ABNORMAL LOW (ref 3.5–5.1)
Sodium: 141 mmol/L (ref 135–145)
Total Bilirubin: 0.7 mg/dL (ref 0.0–1.2)
Total Protein: 6 g/dL — ABNORMAL LOW (ref 6.5–8.1)

## 2023-03-26 LAB — CBC
HCT: 30.5 % — ABNORMAL LOW (ref 39.0–52.0)
Hemoglobin: 10.2 g/dL — ABNORMAL LOW (ref 13.0–17.0)
MCH: 29.1 pg (ref 26.0–34.0)
MCHC: 33.4 g/dL (ref 30.0–36.0)
MCV: 86.9 fL (ref 80.0–100.0)
Platelets: 187 10*3/uL (ref 150–400)
RBC: 3.51 MIL/uL — ABNORMAL LOW (ref 4.22–5.81)
RDW: 13.4 % (ref 11.5–15.5)
WBC: 7.4 10*3/uL (ref 4.0–10.5)
nRBC: 0 % (ref 0.0–0.2)

## 2023-03-26 MED ORDER — ONDANSETRON HCL 4 MG/2ML IJ SOLN: 4.0000 mg | Freq: Once | INTRAMUSCULAR | Status: DC

## 2023-03-26 MED ORDER — MORPHINE SULFATE (PF) 4 MG/ML IV SOLN: 4.0000 mg | Freq: Once | INTRAVENOUS | Status: DC
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
## 2023-03-26 NOTE — ED Triage Notes (Signed)
Refer to First Nurse Note
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
## 2023-03-27 ENCOUNTER — Emergency Department: Payer: Medicare Other

## 2023-03-27 MED ORDER — IOHEXOL 350 MG/ML SOLN
80.0000 mL | Freq: Once | INTRAVENOUS | Status: AC | PRN
  Administered 2023-03-27: 80 mL via INTRAVENOUS

## 2023-03-27 NOTE — Discharge Instructions (Addendum)
Please use your oxycodone, Robaxin at home as needed for pain control.  Your imaging showed no acute traumatic abnormality today.

## 2023-03-29 ENCOUNTER — Encounter: Payer: Medicare Other | Admitting: Physician Assistant

## 2023-03-29 DIAGNOSIS — E11621 Type 2 diabetes mellitus with foot ulcer: Secondary | ICD-10-CM | POA: Diagnosis not present

## 2023-04-05 ENCOUNTER — Encounter: Payer: Medicare Other | Admitting: Physician Assistant

## 2023-04-05 DIAGNOSIS — E11621 Type 2 diabetes mellitus with foot ulcer: Secondary | ICD-10-CM | POA: Diagnosis not present

## 2023-04-11 ENCOUNTER — Ambulatory Visit: Payer: Medicare Other | Admitting: Physician Assistant

## 2023-04-14 ENCOUNTER — Encounter: Payer: Medicare Other | Attending: Physician Assistant | Admitting: Physician Assistant

## 2023-04-14 DIAGNOSIS — I129 Hypertensive chronic kidney disease with stage 1 through stage 4 chronic kidney disease, or unspecified chronic kidney disease: Secondary | ICD-10-CM | POA: Diagnosis not present

## 2023-04-14 DIAGNOSIS — E1122 Type 2 diabetes mellitus with diabetic chronic kidney disease: Secondary | ICD-10-CM | POA: Insufficient documentation

## 2023-04-14 DIAGNOSIS — E11621 Type 2 diabetes mellitus with foot ulcer: Secondary | ICD-10-CM | POA: Insufficient documentation

## 2023-04-14 DIAGNOSIS — N1832 Chronic kidney disease, stage 3b: Secondary | ICD-10-CM | POA: Insufficient documentation

## 2023-04-14 DIAGNOSIS — L97522 Non-pressure chronic ulcer of other part of left foot with fat layer exposed: Secondary | ICD-10-CM | POA: Insufficient documentation

## 2023-04-14 DIAGNOSIS — E1143 Type 2 diabetes mellitus with diabetic autonomic (poly)neuropathy: Secondary | ICD-10-CM | POA: Diagnosis not present

## 2023-04-19 ENCOUNTER — Encounter: Payer: Medicare Other | Admitting: Physician Assistant

## 2023-04-19 DIAGNOSIS — E11621 Type 2 diabetes mellitus with foot ulcer: Secondary | ICD-10-CM | POA: Diagnosis not present

## 2023-04-26 ENCOUNTER — Encounter: Payer: Medicare Other | Admitting: Physician Assistant

## 2023-04-26 DIAGNOSIS — E11621 Type 2 diabetes mellitus with foot ulcer: Secondary | ICD-10-CM | POA: Diagnosis not present

## 2023-05-01 ENCOUNTER — Encounter: Admitting: Dietician

## 2023-05-01 DIAGNOSIS — E11621 Type 2 diabetes mellitus with foot ulcer: Secondary | ICD-10-CM | POA: Diagnosis not present

## 2023-05-01 DIAGNOSIS — E11649 Type 2 diabetes mellitus with hypoglycemia without coma: Secondary | ICD-10-CM

## 2023-05-01 NOTE — Progress Notes (Signed)
 Diabetes Self-Management Education  Visit Type: First/Initial  Appt. Start Time: 0800 Appt. End Time: 0915  05/01/2023  [name], identified by name and date of birth, is a 58 y.o. male with a diagnosis of Diabetes: Type 2.   ASSESSMENT  Patient is here today alone. Patient would like to learn more about kidney diet. Pt reports recent changes include avoiding beef for the past month. Pt states he is aware of carbohydrates and follows a diabetic diet. Patient lives with alone. Pt does the shopping and cooking.  Pt reports he desires his Lantus to be reduce states he forgets lantus occasionally once weekly. Pt reports he follows a NAS, plans to switch to low fat lactaid milk for partner. Pt reports his 90 day average from meter is "162" mg/dL. Pt reports a recent value obtained of "65" mg/dL and treated by "eating something" stating a pack of peanut butter crackers nabs-RD reviewed the rule of 15 and encouraged Pt to carry fast acting sugar. All Pt's questions were answered during this encounter.      History includes:   Past Medical History:  Diagnosis Date   Cancer (HCC)    Diabetes mellitus without complication (HCC)    Neuropathy     Labs noted:   Lab Results  Component Value Date   HGBA1C 11.4 (H) 01/28/2023   Lab Results  Component Value Date   NA 141 03/26/2023   CL 104 03/26/2023   K 3.4 (L) 03/26/2023   CO2 24 03/26/2023   BUN 39 (H) 03/26/2023   CREATININE 2.14 (H) 03/26/2023   GFRNONAA 35 (L) 03/26/2023   CALCIUM 9.2 03/26/2023   ALBUMIN 3.4 (L) 03/26/2023   GLUCOSE 123 (H) 03/26/2023   Medications include:   Current Outpatient Medications:    HUMALOG MIX 75/25 KWIKPEN (75-25) 100 UNIT/ML KwikPen, Inject 30 Units into the skin 2 (two) times daily with a meal. Qam and in the evening.      (He takes both Humalog mix 75/25 BID and then Lantus at bedtime), Disp: , Rfl:    insulin glargine (LANTUS SOLOSTAR) 100 UNIT/ML Solostar Pen, Inject 14 Units into the  skin daily., Disp: , Rfl:    losartan (COZAAR) 50 MG tablet, Take 50 mg by mouth daily., Disp: , Rfl:    rosuvastatin (CRESTOR) 20 MG tablet, Take 20 mg by mouth daily., Disp: , Rfl:    torsemide (DEMADEX) 10 MG tablet, Take 10 mg by mouth every Monday, Wednesday, Friday, and Saturday at 6 PM., Disp: , Rfl:    amoxicillin-clavulanate (AUGMENTIN) 875-125 MG tablet, Take 1 tablet by mouth 2 (two) times daily., Disp: , Rfl:    doxycycline (VIBRA-TABS) 100 MG tablet, Take 1 tablet (100 mg total) by mouth 2 (two) times daily. (Patient not taking: Reported on 03/17/2023), Disp: 20 tablet, Rfl: 0   gabapentin (NEURONTIN) 100 MG capsule, Take 100 mg by mouth 3 (three) times daily., Disp: , Rfl:    methocarbamol (ROBAXIN) 750 MG tablet, Take 750 mg by mouth 2 (two) times daily as needed., Disp: , Rfl:    Oxycodone HCl 10 MG TABS, Take 10 mg by mouth every 6 (six) hours as needed. (Patient not taking: Reported on 02/10/2023), Disp: , Rfl:    tamsulosin (FLOMAX) 0.4 MG CAPS capsule, Take 0.4 mg by mouth daily., Disp: , Rfl:    There were no vitals taken for this visit. There is no height or weight on file to calculate BMI.   Diabetes Self-Management Education - 05/01/23  1191       Visit Information   Visit Type First/Initial      Initial Visit   Diabetes Type Type 2    Date Diagnosed 5 years ago    Are you currently following a meal plan? Yes    What type of meal plan do you follow? carbs counting 30-60 grams per meal    Are you taking your medications as prescribed? Yes      Health Coping   How would you rate your overall health? Fair      Psychosocial Assessment   Patient Belief/Attitude about Diabetes Motivated to manage diabetes    What is the hardest part about your diabetes right now, causing you the most concern, or is the most worrisome to you about your diabetes?   Making healty food and beverage choices    Self-care barriers None    Self-management support Doctor's office    Other  persons present Patient    Patient Concerns Nutrition/Meal planning;Problem Solving    Special Needs None    Preferred Learning Style No preference indicated    Learning Readiness Change in progress    How often do you need to have someone help you when you read instructions, pamphlets, or other written materials from your doctor or pharmacy? 1 - Never    What is the last grade level you completed in school? 11      Pre-Education Assessment   Patient understands the diabetes disease and treatment process. Needs Review    Patient understands incorporating nutritional management into lifestyle. Comprehends key points    Patient undertands incorporating physical activity into lifestyle. Needs Review    Patient understands using medications safely. Needs Review    Patient understands monitoring blood glucose, interpreting and using results Comprehends key points    Patient understands prevention, detection, and treatment of acute complications. Needs Review    Patient understands prevention, detection, and treatment of chronic complications. Needs Review    Patient understands how to develop strategies to address psychosocial issues. Needs Review    Patient understands how to develop strategies to promote health/change behavior. Needs Review      Complications   Last HgB A1C per patient/outside source 11.4 %    How often do you check your blood sugar? 3-4 times/day    Fasting Blood glucose range (mg/dL) 478-295;62-130;865-784;>696    Postprandial Blood glucose range (mg/dL) 295-284;132-440    Number of hypoglycemic episodes per month 1    Can you tell when your blood sugar is low? Yes    What do you do if your blood sugar is low? weak    Number of hyperglycemic episodes ( >200mg /dL): Occasional    Can you tell when your blood sugar is high? Yes    What do you do if your blood sugar is high? none    Have you had a dilated eye exam in the past 12 months? Yes    Have you had a dental exam in  the past 12 months? Yes    Are you checking your feet? Yes    How many days per week are you checking your feet? 7      Dietary Intake   Breakfast banana or oatmeal with blueberries, applesauce or bran flakes, 1% coffee with half and half, equal    Lunch carrots, celery with frech onion dip    Snack (afternoon) peanut butter nabs    Dinner 1/2 parm chicken sub or 1 chicken breast, celery,  green peas, onions, carrots    Beverage(s) water, coffee with half and half, equal, beer      Activity / Exercise   Activity / Exercise Type ADL's    How many days per week do you exercise? 7    How many minutes per day do you exercise? 10    Total minutes per week of exercise 70      Patient Education   Previous Diabetes Education Yes (please comment)    Disease Pathophysiology Definition of diabetes, type 1 and 2, and the diagnosis of diabetes    Healthy Eating Carbohydrate counting    Being Active Role of exercise on diabetes management, blood pressure control and cardiac health.    Medications Other (comment)    Monitoring Daily foot exams;Yearly dilated eye exam;Identified appropriate SMBG and/or A1C goals.    Acute complications Taught prevention, symptoms, and  treatment of hypoglycemia - the 15 rule.    Chronic complications Relationship between chronic complications and blood glucose control;Assessed and discussed foot care and prevention of foot problems;Identified and discussed with patient  current chronic complications    Diabetes Stress and Support Identified and addressed patients feelings and concerns about diabetes    Lifestyle and Health Coping Lifestyle issues that need to be addressed for better diabetes care      Post-Education Assessment   Patient understands the diabetes disease and treatment process. Comprehends key points    Patient understands incorporating nutritional management into lifestyle. Comprehends key points    Patient undertands incorporating physical activity  into lifestyle. Comprehends key points    Patient understands using medications safely. Needs Review    Patient understands monitoring blood glucose, interpreting and using results Comprehends key points    Patient understands prevention, detection, and treatment of acute complications. Comprehends key points    Patient understands prevention, detection, and treatment of chronic complications. Comprehends key points    Patient understands how to develop strategies to address psychosocial issues. Comprehends key points    Patient understands how to develop strategies to promote health/change behavior. Comprehends key points      Outcomes   Expected Outcomes Demonstrated interest in learning. Expect positive outcomes    Future DMSE 3-4 months    Program Status Not Completed             Individualized Plan for Diabetes Self-Management Training:   Learning Objective:  Patient will have a greater understanding of diabetes self-management. Patient education plan is to attend individual and/or group sessions per assessed needs and concerns.   Plan:   Patient Instructions  1-aim for balanced meals and snacks  Expected Outcomes:  Demonstrated interest in learning. Expect positive outcomes  Education material provided: ADA - How to Thrive: A Guide for Your Journey with Diabetes, Snack sheet, No sodium seasonings, and Diabetes Resources  If problems or questions, patient to contact team via:  Phone  Future DSME appointment: 3-4 months

## 2023-05-01 NOTE — Patient Instructions (Addendum)
 1-aim for balanced meals and snacks

## 2023-05-03 ENCOUNTER — Encounter: Payer: Medicare Other | Admitting: Internal Medicine

## 2023-05-03 DIAGNOSIS — E11621 Type 2 diabetes mellitus with foot ulcer: Secondary | ICD-10-CM | POA: Diagnosis not present

## 2023-05-10 ENCOUNTER — Encounter: Attending: Physician Assistant | Admitting: Physician Assistant

## 2023-05-10 DIAGNOSIS — I129 Hypertensive chronic kidney disease with stage 1 through stage 4 chronic kidney disease, or unspecified chronic kidney disease: Secondary | ICD-10-CM | POA: Diagnosis not present

## 2023-05-10 DIAGNOSIS — L97522 Non-pressure chronic ulcer of other part of left foot with fat layer exposed: Secondary | ICD-10-CM | POA: Diagnosis not present

## 2023-05-10 DIAGNOSIS — E1143 Type 2 diabetes mellitus with diabetic autonomic (poly)neuropathy: Secondary | ICD-10-CM | POA: Diagnosis not present

## 2023-05-10 DIAGNOSIS — E11621 Type 2 diabetes mellitus with foot ulcer: Secondary | ICD-10-CM | POA: Diagnosis present

## 2023-05-10 DIAGNOSIS — N1832 Chronic kidney disease, stage 3b: Secondary | ICD-10-CM | POA: Diagnosis not present

## 2023-05-17 ENCOUNTER — Encounter: Admitting: Physician Assistant

## 2023-05-17 DIAGNOSIS — E11621 Type 2 diabetes mellitus with foot ulcer: Secondary | ICD-10-CM | POA: Diagnosis not present

## 2023-05-23 ENCOUNTER — Encounter: Payer: Self-pay | Admitting: Ophthalmology

## 2023-05-23 NOTE — Anesthesia Preprocedure Evaluation (Addendum)
 Anesthesia Evaluation  Patient identified by MRN, date of birth, ID band Patient awake    Reviewed: Allergy & Precautions, H&P , NPO status , Patient's Chart, lab work & pertinent test results  Airway Mallampati: II  TM Distance: >3 FB Neck ROM: Full    Dental no notable dental hx. (+) Loose   Pulmonary neg pulmonary ROS, former smoker Pulmonary hypertension: : mild Echo RSVP: 40     Pulmonary exam normal breath sounds clear to auscultation       Cardiovascular hypertension, negative cardio ROS Normal cardiovascular exam+ Cardiac Defibrillator  Rhythm:Regular Rate:Normal  08-15-18 echo  EF>55%  NORMAL STRESS TEST. NORMAL RESTING STUDY WITH NO WALL MOTION ABNORMALITIES   AT REST AND PEAK STRESS.   NORMAL LA PRESSURES WITH NORMAL DIASTOLIC FUNCTION   VALVULAR REGURGITATION: TRIVIAL AR, TRIVIAL MR, MILD TR   NO VALVULAR STENOSIS   RESTING HYPERTENSION - APPROPRIATE RESPONSE   echo in 06/2011 during AKI requiring hospitalization (OSH) - EF > 55%, trace MR, mild to moderate TR, RVSP 40 mmHg, mild pulmHTN   Neuro/Psych  Neuromuscular disease negative neurological ROS  negative psych ROS   GI/Hepatic negative GI ROS, Neg liver ROS,,,  Endo/Other  negative endocrine ROSdiabetes    Renal/GU Renal diseasenegative Renal ROS  negative genitourinary   Musculoskeletal negative musculoskeletal ROS (+)    Abdominal   Peds negative pediatric ROS (+)  Hematology negative hematology ROS (+) Blood dyscrasia, anemia   Anesthesia Other Findings Poorly controlled diabetes mellitus  April 2022:  left tibial plateau fracture. Date of injury is 05/16/2020 when he missed a stair and land awkwardly.   Hx of poorly controlled DM with neuropathy, CKD3, HTN, and rectal CA s/p surgical resection and chemoradiation.   Medical History   HEENT  Diabetic retinopathy-avastin eye injections q5weeks  Cardiovascular   Exercise tolerance:  <4 METS (NWB with crutches)  HTN (140/80): controlled  08/2018-Stress echo-Normal stress test, normal resting study with no wall motion abnormalities at rest and peak stress, normal LA pressures with normal diastolic function, mild TR   Gastointestinal  GI malignancy (Rectal CA s/p LAR 12/2014): Colon Neoadjuvant chemoradiation (from 09/17/14 to 10/27/2014)   Recurrent CA-completed radiation therapy on 06/15/2018, s/p hemicolectomy 08/2018 Post op perianal abscess. Treated with colostomy  GU/Renal  Chronic kidney disease: Stage 3 CKD  Nephrolithiasis Followed by nephrology  05/2020-Cr-1.98   03-17-23 office note: ulcer to the plantar aspect of the left forefoot.  He is now going to the wound care center weekly.  He also has home health nurse dressing changes 2x/wk. 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) Diabetes mellitus due to underlying condition with diabetic autonomic neuropathy, unspecified whether long term insulin  use (HCC)  Ulcer of left foot with fat layer exposed (HCC)    Reproductive/Obstetrics negative OB ROS                              Anesthesia Physical Anesthesia Plan  ASA: 3  Anesthesia Plan: MAC   Post-op Pain Management:    Induction: Intravenous  PONV Risk Score and Plan:   Airway Management Planned: Natural Airway and Nasal Cannula  Additional Equipment:   Intra-op Plan:   Post-operative Plan:   Informed Consent: I have reviewed the patients History and  Physical, chart, labs and discussed the procedure including the risks, benefits and alternatives for the proposed anesthesia with the patient or authorized representative who has indicated his/her understanding and acceptance.     Dental Advisory Given  Plan  Discussed with: Anesthesiologist, CRNA and Surgeon  Anesthesia Plan Comments: (Patient consented for risks of anesthesia including but not limited to:  - adverse reactions to medications - damage to eyes, teeth, lips or other oral mucosa - nerve damage due to positioning  - sore throat or hoarseness - Damage to heart, brain, nerves, lungs, other parts of body or loss of life  Patient voiced understanding and assent.)        Anesthesia Quick Evaluation

## 2023-05-24 ENCOUNTER — Ambulatory Visit: Admitting: Physician Assistant

## 2023-05-30 NOTE — Discharge Instructions (Signed)

## 2023-05-31 ENCOUNTER — Encounter: Admitting: Physician Assistant

## 2023-05-31 DIAGNOSIS — E11621 Type 2 diabetes mellitus with foot ulcer: Secondary | ICD-10-CM | POA: Diagnosis not present

## 2023-06-01 ENCOUNTER — Other Ambulatory Visit: Payer: Self-pay

## 2023-06-01 ENCOUNTER — Encounter: Payer: Self-pay | Admitting: Ophthalmology

## 2023-06-01 ENCOUNTER — Ambulatory Visit
Admission: RE | Admit: 2023-06-01 | Discharge: 2023-06-01 | Disposition: A | Discharge status: Home / Self Care | Attending: Ophthalmology | Admitting: Ophthalmology

## 2023-06-01 ENCOUNTER — Ambulatory Visit: Payer: Self-pay | Admitting: Anesthesiology

## 2023-06-01 ENCOUNTER — Encounter
Admission: RE | Disposition: A | Discharge status: Home / Self Care | Payer: Self-pay | Source: Home / Self Care | Attending: Ophthalmology

## 2023-06-01 DIAGNOSIS — G709 Myoneural disorder, unspecified: Secondary | ICD-10-CM | POA: Insufficient documentation

## 2023-06-01 DIAGNOSIS — I129 Hypertensive chronic kidney disease with stage 1 through stage 4 chronic kidney disease, or unspecified chronic kidney disease: Secondary | ICD-10-CM | POA: Insufficient documentation

## 2023-06-01 DIAGNOSIS — N1832 Chronic kidney disease, stage 3b: Secondary | ICD-10-CM | POA: Insufficient documentation

## 2023-06-01 DIAGNOSIS — Z95810 Presence of automatic (implantable) cardiac defibrillator: Secondary | ICD-10-CM | POA: Diagnosis not present

## 2023-06-01 DIAGNOSIS — I272 Pulmonary hypertension, unspecified: Secondary | ICD-10-CM | POA: Insufficient documentation

## 2023-06-01 DIAGNOSIS — E1122 Type 2 diabetes mellitus with diabetic chronic kidney disease: Secondary | ICD-10-CM | POA: Insufficient documentation

## 2023-06-01 DIAGNOSIS — E1136 Type 2 diabetes mellitus with diabetic cataract: Secondary | ICD-10-CM | POA: Diagnosis not present

## 2023-06-01 DIAGNOSIS — Z87891 Personal history of nicotine dependence: Secondary | ICD-10-CM | POA: Diagnosis not present

## 2023-06-01 DIAGNOSIS — H2511 Age-related nuclear cataract, right eye: Secondary | ICD-10-CM | POA: Insufficient documentation

## 2023-06-01 DIAGNOSIS — Z5986 Financial insecurity: Secondary | ICD-10-CM | POA: Insufficient documentation

## 2023-06-01 DIAGNOSIS — Z794 Long term (current) use of insulin: Secondary | ICD-10-CM | POA: Insufficient documentation

## 2023-06-01 HISTORY — DX: Colostomy status: Z93.3

## 2023-06-01 HISTORY — DX: Chronic kidney disease, stage 3b: N18.32

## 2023-06-01 HISTORY — DX: Type 2 diabetes mellitus with foot ulcer: L97.529

## 2023-06-01 HISTORY — PX: CATARACT EXTRACTION W/PHACO: SHX586

## 2023-06-01 HISTORY — DX: Diabetes mellitus due to underlying condition with diabetic autonomic (poly)neuropathy: E08.43

## 2023-06-01 HISTORY — DX: Type 2 diabetes mellitus with diabetic polyneuropathy: E11.42

## 2023-06-01 HISTORY — DX: Type 2 diabetes mellitus with foot ulcer: E11.621

## 2023-06-01 HISTORY — DX: Malignant neoplasm of rectum: C20

## 2023-06-01 HISTORY — DX: Type 2 diabetes mellitus with unspecified diabetic retinopathy without macular edema: E11.319

## 2023-06-01 HISTORY — DX: Non-pressure chronic ulcer of other part of left foot with fat layer exposed: L97.522

## 2023-06-01 LAB — GLUCOSE, CAPILLARY: Glucose-Capillary: 258 mg/dL — ABNORMAL HIGH (ref 70–99)

## 2023-06-01 SURGERY — PHACOEMULSIFICATION, CATARACT, WITH IOL INSERTION
Anesthesia: Monitor Anesthesia Care | Laterality: Right

## 2023-06-01 MED ORDER — MIDAZOLAM HCL 2 MG/2ML IJ SOLN
INTRAMUSCULAR | Status: DC | PRN
  Administered 2023-06-01: 2 mg via INTRAVENOUS

## 2023-06-01 MED ORDER — SIGHTPATH DOSE#1 NA HYALUR & NA CHOND-NA HYALUR IO KIT
PACK | INTRAOCULAR | Status: DC | PRN
  Administered 2023-06-01: 1 via OPHTHALMIC

## 2023-06-01 MED ORDER — BSS IO SOLN
INTRAOCULAR | Status: DC | PRN
  Administered 2023-06-01: 15 mL via INTRAOCULAR

## 2023-06-01 MED ORDER — TETRACAINE HCL 0.5 % OP SOLN
1.0000 [drp] | OPHTHALMIC | Status: DC | PRN
  Administered 2023-06-01 (×3): 1 [drp] via OPHTHALMIC

## 2023-06-01 MED ORDER — ARMC OPHTHALMIC DILATING DROPS
1.0000 | OPHTHALMIC | Status: DC | PRN
  Administered 2023-06-01 (×3): 1 via OPHTHALMIC

## 2023-06-01 MED ORDER — ARMC OPHTHALMIC DILATING DROPS
OPHTHALMIC | Status: AC
Start: 2023-06-01 — End: ?
  Filled 2023-06-01: qty 0.5

## 2023-06-01 MED ORDER — TETRACAINE HCL 0.5 % OP SOLN
OPHTHALMIC | Status: AC
Start: 2023-06-01 — End: ?
  Filled 2023-06-01: qty 4

## 2023-06-01 MED ORDER — PHENYLEPHRINE-KETOROLAC 1-0.3 % IO SOLN
INTRAOCULAR | Status: DC | PRN
  Administered 2023-06-01: 93 mL via OPHTHALMIC

## 2023-06-01 MED ORDER — BRIMONIDINE TARTRATE-TIMOLOL 0.2-0.5 % OP SOLN
OPHTHALMIC | Status: DC | PRN
  Administered 2023-06-01: 1 [drp] via OPHTHALMIC

## 2023-06-01 MED ORDER — FENTANYL CITRATE (PF) 100 MCG/2ML IJ SOLN
INTRAMUSCULAR | Status: DC | PRN
  Administered 2023-06-01: 50 ug via INTRAVENOUS

## 2023-06-01 MED ORDER — LIDOCAINE HCL (PF) 2 % IJ SOLN
INTRAOCULAR | Status: DC | PRN
  Administered 2023-06-01: 1 mL via INTRAOCULAR

## 2023-06-01 MED ORDER — MOXIFLOXACIN HCL 0.5 % OP SOLN
OPHTHALMIC | Status: DC | PRN
  Administered 2023-06-01: .2 mL via OPHTHALMIC

## 2023-06-01 MED ORDER — MIDAZOLAM HCL 2 MG/2ML IJ SOLN
INTRAMUSCULAR | Status: AC
Start: 2023-06-01 — End: ?
  Filled 2023-06-01: qty 2

## 2023-06-01 MED ORDER — FENTANYL CITRATE (PF) 100 MCG/2ML IJ SOLN
INTRAMUSCULAR | Status: AC
  Filled 2023-06-01: qty 2

## 2023-06-01 SURGICAL SUPPLY — 15 items
CATARACT SUITE SIGHTPATH (MISCELLANEOUS) ×1 IMPLANT
DISSECTOR HYDRO NUCLEUS 50X22 (MISCELLANEOUS) ×1 IMPLANT
DRSG TEGADERM 2-3/8X2-3/4 SM (GAUZE/BANDAGES/DRESSINGS) ×1 IMPLANT
FEE CATARACT SUITE SIGHTPATH (MISCELLANEOUS) ×1 IMPLANT
GLOVE BIOGEL PI IND STRL 8 (GLOVE) ×1 IMPLANT
GLOVE SURG LX STRL 7.5 STRW (GLOVE) ×1 IMPLANT
GLOVE SURG PROTEXIS BL SZ6.5 (GLOVE) ×1 IMPLANT
GLOVE SURG SYN 6.5 PF PI BL (GLOVE) ×1 IMPLANT
LENS IOL DIOP 21.0 (Intraocular Lens) ×1 IMPLANT
LENS IOL TECNIS MONO 21.0 (Intraocular Lens) IMPLANT
NDL FILTER BLUNT 18X1 1/2 (NEEDLE) ×1 IMPLANT
NEEDLE FILTER BLUNT 18X1 1/2 (NEEDLE) ×1 IMPLANT
RING MALYGIN (MISCELLANEOUS) IMPLANT
SYR 3ML LL SCALE MARK (SYRINGE) ×1 IMPLANT
SYR 5ML LL (SYRINGE) IMPLANT

## 2023-06-01 NOTE — H&P (Signed)
 [facility]   Primary Care Physician:  Nicholaus Bark, MD Ophthalmologist: Dr. Meg Spina  Pre-Procedure History & Physical: HPI:  Jason Jennings is a 58 y.o. male here for cataract surgery.   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 Authorizing Provider  gabapentin  (NEURONTIN ) 100 MG capsule Take 100 mg by mouth 3 (three) times daily. 08/21/18  Yes [provider]  HUMALOG MIX 75/25 KWIKPEN (75-25) 100 UNIT/ML KwikPen Inject 30 Units into the skin 2 (two) times daily with a meal. Qam and in the evening.      (He takes both Humalog mix 75/25 BID and then Lantus  at bedtime)   Yes [provider]  insulin  glargine (LANTUS  SOLOSTAR) 100 UNIT/ML Solostar Pen Inject 14 Units into the skin daily.   Yes [provider]  losartan  (COZAAR ) 50 MG tablet Take 50 mg by mouth daily.   Yes [provider]  methocarbamol (ROBAXIN) 750 MG tablet Take 750 mg by mouth 2 (two) times daily as needed. 12/28/22  Yes [provider]  rosuvastatin  (CRESTOR ) 20 MG tablet Take 20 mg by mouth daily. 01/24/18  Yes [provider]  tamsulosin  (FLOMAX ) 0.4 MG CAPS capsule Take 0.4 mg by mouth daily.   Yes [provider]  torsemide (DEMADEX) 10 MG tablet Take 10 mg by mouth  every Monday, Wednesday, Friday, and Saturday at 6 PM.   Yes [provider]  Oxycodone  HCl 10 MG TABS Take 10 mg by mouth every 6 (six) hours as needed. Patient not taking: Reported on 02/10/2023 12/23/22   [provider]    Allergies as of 04/11/2023   (No Known Allergies)    History reviewed. No pertinent family history.  Social History   Socioeconomic History   Marital status: Single    Spouse name: Not on file   Number of children: Not on file   Years of education: Not on file   Highest education level: Not on file  Occupational History   Not on file  Tobacco Use   Smoking status: Former    Current packs/day: 0.00    Types: Cigarettes    Quit date: 12/09/2014    Years since quitting: 8.4   Smokeless tobacco: Never  Vaping Use   Vaping status: Never Used  Substance and Sexual Activity   Alcohol use: Not Currently   Drug use: Not Currently    Types: Marijuana   Sexual activity: Not on file  Other Topics Concern   Not on file  Social History Narrative   Not on file   Social Drivers of Health   Financial Resource Strain: Medium Risk (05/25/2020)   Received from [facility], [facility]   Overall Financial  Resource Strain (CARDIA)    Difficulty of Paying Living Expenses: Somewhat hard  Food Insecurity: No Food Insecurity (05/01/2023)   Hunger Vital Sign    Worried About Running Out of Food in the Last Year: Never true    Ran Out of Food in the Last Year: Never true  Transportation Needs: No Transportation Needs (01/29/2023)   PRAPARE - Administrator, Civil Service (Medical): No    Lack of Transportation (Non-Medical): No  Physical Activity: Insufficiently Active (05/25/2020)   Received from [facility], [facility]   Exercise Vital Sign    Days of Exercise per Week: 3 days    Minutes of Exercise per Session: 10 min  Stress: No Stress Concern Present (05/25/2020)   Received from [facility], [facility] - Occupational Stress Questionnaire    Feeling of Stress : Only a little  Social Connections: Unknown (06/21/2021)   Received from [facility], [facility]   Social Network    Social Network: Not on file  Intimate Partner Violence: Not At Risk (01/29/2023)   Humiliation, Afraid, Rape, and Kick questionnaire    Fear of Current or Ex-Partner: No    Emotionally Abused: No    Physically Abused: No    Sexually Abused: No    Review of Systems: See HPI, otherwise negative ROS  Physical Exam: Ht 5\' 6"  (1.676 m)   Wt 87.5 kg   BMI 31.15 kg/m  General:   Alert, cooperative in NAD Head:  Normocephalic and atraumatic. Respiratory:  Normal work of breathing. Cardiovascular:  RRR  Impression/Plan: Jason Jennings is here for cataract surgery.  Risks, benefits, limitations, and alternatives regarding cataract surgery have been reviewed with the patient.  Questions have been answered.  All parties agreeable.   Trudi Fus, MD  06/01/2023, 7:23 AM

## 2023-06-01 NOTE — Transfer of Care (Signed)
 Immediate Anesthesia Transfer of Care Note  Patient: Jason Jennings  Procedure(s) Performed: PHACOEMULSIFICATION, CATARACT, WITH IOL INSERTION 7.85, 00:57.4 (Right)  Patient Location: PACU  Anesthesia Type: MAC  Level of Consciousness: awake, alert  and patient cooperative  Airway and Oxygen Therapy: Patient Spontanous Breathing and Patient connected to supplemental oxygen  Post-op Assessment: Post-op Vital signs reviewed, Patient's Cardiovascular Status Stable, Respiratory Function Stable, Patent Airway and No signs of Nausea or vomiting  Post-op Vital Signs: Reviewed and stable  Complications: No notable events documented.

## 2023-06-01 NOTE — Op Note (Signed)
 OPERATIVE NOTE  Jason Jennings [id number] 06/01/2023   PREOPERATIVE DIAGNOSIS: Nuclear sclerotic cataract right eye. H25.11   POSTOPERATIVE DIAGNOSIS: Nuclear sclerotic cataract right eye. H25.11   PROCEDURE:  Phacoemusification with posterior chamber intraocular lens placement of the right eye  Ultrasound time: Procedure(s): PHACOEMULSIFICATION, CATARACT, WITH IOL INSERTION 7.85, 00:57.4 (Right)  LENS:   Implant Name Type Inv. Item Serial No. Manufacturer Lot No. LRB No. Used Action  LENS IOL DIOP 21.0 - [id number] Intraocular Lens LENS IOL DIOP 21.0 [id number] [employer]  Right 1 Implanted      SURGEON:  Rosy Cooper. Donalda Fruit, MD   ANESTHESIA:  Topical with tetracaine  drops, augmented with 1% preservative-free intracameral lidocaine .   COMPLICATIONS:  None.   DESCRIPTION OF PROCEDURE:  The patient was identified in the holding room and transported to the operating room and placed in the supine position under the operating microscope.  The right eye was identified as the operative eye, which was prepped and draped in the usual sterile ophthalmic fashion.   A 1 millimeter clear-corneal paracentesis was made superotemporally. Preservative-free 1% lidocaine  mixed with 1:1,000 bisulfite-free aqueous solution of epinephrine  was injected into the anterior chamber. The anterior chamber was then filled with Viscoat viscoelastic. The pupil remained miotic despite use of dilating drops and attempted viscodilation, so a 6.25 mm Malyugin ring was inserted and engaged the pupil margin to maintain adequate dilation for cataract removal. A 2.4 millimeter keratome was used to make a clear-corneal incision inferotemporally. A curvilinear capsulorrhexis was made with a cystotome and capsulorrhexis forceps. Balanced salt  solution was used to hydrodissect and hydrodelineate the nucleus. Phacoemulsification was then used to remove the lens nucleus and epinucleus. The remaining cortex was then removed using the  irrigation and aspiration handpiece. Provisc was then placed into the capsular bag to distend it for lens placement. A +21.00 D [id number] intraocular lens was then injected into the capsular bag. The Malyugin ring was removed from the eye. The remaining viscoelastic was aspirated.   Wounds were hydrated with balanced salt  solution.  The anterior chamber was inflated to a physiologic pressure with balanced salt  solution.  No wound leaks were noted. Moxifloxacin  was injected intracamerally.  Timolol  and Brimonidine  drops were applied to the eye.  The patient was taken to the recovery room in stable condition without complications of anesthesia or surgery.  [name] [place] 06/01/2023, 1:06 PM

## 2023-06-01 NOTE — Anesthesia Postprocedure Evaluation (Signed)
 Anesthesia Post Note  Patient: Jason Jennings  Procedure(s) Performed: PHACOEMULSIFICATION, CATARACT, WITH IOL INSERTION 7.85, 00:57.4 (Right)  Patient location during evaluation: PACU Anesthesia Type: MAC Level of consciousness: awake and alert Pain management: pain level controlled Vital Signs Assessment: post-procedure vital signs reviewed and stable Respiratory status: spontaneous breathing, nonlabored ventilation, respiratory function stable and patient connected to nasal cannula oxygen Cardiovascular status: stable and blood pressure returned to baseline Postop Assessment: no apparent nausea or vomiting Anesthetic complications: no   No notable events documented.   Last Vitals:  Vitals:   06/01/23 1310 06/01/23 1315  BP: (!) 115/59 111/62  Pulse: 68 69  Resp: 12 14  Temp: (!) 36.4 C (!) 36.4 C  SpO2: 97% 98%    Last Pain:  Vitals:   06/01/23 1315  TempSrc:   PainSc: 0-No pain                 Ayva Veilleux C Destyne Goodreau

## 2023-06-07 ENCOUNTER — Encounter: Payer: Self-pay | Admitting: Ophthalmology

## 2023-06-07 NOTE — Anesthesia Preprocedure Evaluation (Addendum)
 Anesthesia Evaluation  Patient identified by MRN, date of birth, ID band Patient awake    Reviewed: Allergy & Precautions, NPO status , Patient's Chart, lab work & pertinent test results  History of Anesthesia Complications Negative for: history of anesthetic complications  Airway Mallampati: I   Neck ROM: Full    Dental  (+) Missing   Pulmonary former smoker (quit 2016)   Pulmonary exam normal breath sounds clear to auscultation       Cardiovascular hypertension, Normal cardiovascular exam Rhythm:Regular Rate:Normal     Neuro/Psych  Neuromuscular disease (diabetic polyneuropathy)    GI/Hepatic Rectal CA   Endo/Other  diabetes, Type 2  Obesity   Renal/GU Renal disease (stage III CKD)     Musculoskeletal   Abdominal   Peds  Hematology  (+) Blood dyscrasia, anemia   Anesthesia Other Findings   Reproductive/Obstetrics                             Anesthesia Physical Anesthesia Plan  ASA: 3  Anesthesia Plan: MAC   Post-op Pain Management:    Induction: Intravenous  PONV Risk Score and Plan: 1 and Treatment may vary due to age or medical condition, Midazolam  and TIVA  Airway Management Planned: Natural Airway and Nasal Cannula  Additional Equipment:   Intra-op Plan:   Post-operative Plan:   Informed Consent: I have reviewed the patients History and Physical, chart, labs and discussed the procedure including the risks, benefits and alternatives for the proposed anesthesia with the patient or authorized representative who has indicated his/her understanding and acceptance.     Dental advisory given  Plan Discussed with: CRNA  Anesthesia Plan Comments: (LMA/GETA backup discussed.  Patient consented for risks of anesthesia including but not limited to:  - adverse reactions to medications - damage to eyes, teeth, lips or other oral mucosa - nerve damage due to positioning  -  sore throat or hoarseness - damage to heart, brain, nerves, lungs, other parts of body or loss of life  Informed patient about role of CRNA in peri- and intra-operative care.  Patient voiced understanding.)       Anesthesia Quick Evaluation

## 2023-06-13 ENCOUNTER — Encounter: Payer: Self-pay | Admitting: Podiatry

## 2023-06-13 ENCOUNTER — Ambulatory Visit: Payer: Medicare Other | Admitting: Podiatry

## 2023-06-13 DIAGNOSIS — L97522 Non-pressure chronic ulcer of other part of left foot with fat layer exposed: Secondary | ICD-10-CM | POA: Diagnosis not present

## 2023-06-13 DIAGNOSIS — B351 Tinea unguium: Secondary | ICD-10-CM

## 2023-06-13 DIAGNOSIS — M79675 Pain in left toe(s): Secondary | ICD-10-CM

## 2023-06-13 DIAGNOSIS — M79674 Pain in right toe(s): Secondary | ICD-10-CM | POA: Diagnosis not present

## 2023-06-13 NOTE — Progress Notes (Signed)
 Chief Complaint  Patient presents with   Diabetes    "It's a follow-up for my foot infection.  I want to have Diabetic foot care."    HPI: 58 y.o. male PMHx diabetes mellitus; uncontrolled; last A1c on 01/28/2023 was 11.4; presenting today for routine footcare.  Patient has been seen weekly at the [facility] wound care center for left forefoot ulcer.  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  Dermatology: Today the ulcer noted to the plantar aspect of the fourth MTP of the left forefoot of the overlying callus tissue there is a very superficial small wound that measures approximately 0.2 x 0.2 x 0.1 cm.  No exposed bone.  Granular wound base with good potential for healing Hyperkeratotic dystrophic elongated nails also noted 1-5 bilateral  Vascular: Significant reduction of the erythema and edema noted to the left forefoot.   VAS US  ABI WITH/WO TBI  02/28/2023 Exam ended: Pending final read  Neurological: Light touch and protective threshold diminished  Musculoskeletal Exam: No pedal deformities noted.  No prior amputations.  Patient ambulatory  DG Foot Complete Left 01/28/2023 FINDINGS: No acute bony abnormality. Specifically, no fracture, subluxation, or dislocation. No bone destruction to suggest osteomyelitis. Joint spaces maintained. IMPRESSION: No acute bony abnormality.  MR FOOT LEFT WO CONTRAST 01/29/2023 IMPRESSION: 1. Diffuse and marked subcutaneous soft tissue swelling/edema/fluid most notably along the dorsum of the foot consistent with cellulitis. No discrete fluid collection to suggest a drainable abscess. 2. Mild diffuse myositis without findings suspicious for pyomyositis. 3. Abnormal distal interphalangeal joint of the fifth toe is most likely a chronic arthropathic process possibly related to prior trauma or prior septic arthritis. Do not see any surrounding inflammatory changes to suggest this is acute septic arthritis. 4. No findings suspicious for acute septic arthritis or osteomyelitis.    Assessment/Plan of Care: 1.  Ulcer plantar aspect of the fourth MTP 2.  Diabetes mellitus with peripheral polyneuropathy; uncontrolled 3.  Pain due to onychomycosis of toenails both  -Patient evaluated -Mechanical debridement of nails 1-5 bilateral was performed using a nail nipper without incident or bleeding -Medically necessary excisional debridement including subcutaneous tissue was performed today using a 312 scalpel.  Excisional debridement of the necrotic nonviable tissue down to healthy bleeding viable tissue was performed with posterior measurement status.  Light dressing applied -Offloading quarter inch felt  metatarsal pad was applied to the insole of the left shoe to offload pressure from the forefoot -Return to clinic 3 months routine footcare     Dot Gazella, DPM [facility]  Dr. Dot Gazella, DPM    [address] [place], [place] [address]                Office [phone]  Fax [phone]

## 2023-06-13 NOTE — Discharge Instructions (Signed)

## 2023-06-15 ENCOUNTER — Ambulatory Visit: Payer: Self-pay | Admitting: Anesthesiology

## 2023-06-15 ENCOUNTER — Encounter
Admission: RE | Disposition: A | Discharge status: Home / Self Care | Payer: Self-pay | Source: Home / Self Care | Attending: Ophthalmology

## 2023-06-15 ENCOUNTER — Other Ambulatory Visit: Payer: Self-pay

## 2023-06-15 ENCOUNTER — Encounter: Payer: Self-pay | Admitting: Ophthalmology

## 2023-06-15 ENCOUNTER — Ambulatory Visit
Admission: RE | Admit: 2023-06-15 | Discharge: 2023-06-15 | Disposition: A | Discharge status: Home / Self Care | Attending: Ophthalmology | Admitting: Ophthalmology

## 2023-06-15 DIAGNOSIS — E1122 Type 2 diabetes mellitus with diabetic chronic kidney disease: Secondary | ICD-10-CM | POA: Diagnosis not present

## 2023-06-15 DIAGNOSIS — H2512 Age-related nuclear cataract, left eye: Secondary | ICD-10-CM | POA: Insufficient documentation

## 2023-06-15 DIAGNOSIS — Z87891 Personal history of nicotine dependence: Secondary | ICD-10-CM | POA: Diagnosis not present

## 2023-06-15 DIAGNOSIS — E1142 Type 2 diabetes mellitus with diabetic polyneuropathy: Secondary | ICD-10-CM | POA: Insufficient documentation

## 2023-06-15 DIAGNOSIS — N183 Chronic kidney disease, stage 3 unspecified: Secondary | ICD-10-CM | POA: Insufficient documentation

## 2023-06-15 DIAGNOSIS — E1136 Type 2 diabetes mellitus with diabetic cataract: Secondary | ICD-10-CM | POA: Diagnosis not present

## 2023-06-15 DIAGNOSIS — E669 Obesity, unspecified: Secondary | ICD-10-CM | POA: Insufficient documentation

## 2023-06-15 DIAGNOSIS — Z794 Long term (current) use of insulin: Secondary | ICD-10-CM | POA: Insufficient documentation

## 2023-06-15 DIAGNOSIS — H25012 Cortical age-related cataract, left eye: Secondary | ICD-10-CM | POA: Diagnosis present

## 2023-06-15 DIAGNOSIS — Z6832 Body mass index (BMI) 32.0-32.9, adult: Secondary | ICD-10-CM | POA: Diagnosis not present

## 2023-06-15 DIAGNOSIS — Z85048 Personal history of other malignant neoplasm of rectum, rectosigmoid junction, and anus: Secondary | ICD-10-CM | POA: Insufficient documentation

## 2023-06-15 DIAGNOSIS — I10 Essential (primary) hypertension: Secondary | ICD-10-CM | POA: Insufficient documentation

## 2023-06-15 HISTORY — PX: CATARACT EXTRACTION W/PHACO: SHX586

## 2023-06-15 LAB — GLUCOSE, CAPILLARY: Glucose-Capillary: 186 mg/dL — ABNORMAL HIGH (ref 70–99)

## 2023-06-15 SURGERY — PHACOEMULSIFICATION, CATARACT, WITH IOL INSERTION
Anesthesia: Monitor Anesthesia Care | Site: Eye | Laterality: Left

## 2023-06-15 MED ORDER — ARMC OPHTHALMIC DILATING DROPS
1.0000 | OPHTHALMIC | Status: DC | PRN
  Administered 2023-06-15 (×3): 1 via OPHTHALMIC

## 2023-06-15 MED ORDER — SIGHTPATH DOSE#1 BSS IO SOLN
INTRAOCULAR | Status: DC | PRN
  Administered 2023-06-15: 73 mL via OPHTHALMIC

## 2023-06-15 MED ORDER — ARMC OPHTHALMIC DILATING DROPS
OPHTHALMIC | Status: AC
  Filled 2023-06-15: qty 0.5

## 2023-06-15 MED ORDER — TETRACAINE HCL 0.5 % OP SOLN
1.0000 [drp] | OPHTHALMIC | Status: DC | PRN
  Administered 2023-06-15 (×3): 1 [drp] via OPHTHALMIC

## 2023-06-15 MED ORDER — MIDAZOLAM HCL 2 MG/2ML IJ SOLN
INTRAMUSCULAR | Status: DC | PRN
  Administered 2023-06-15: 2 mg via INTRAVENOUS

## 2023-06-15 MED ORDER — SIGHTPATH DOSE#1 BSS IO SOLN
INTRAOCULAR | Status: DC | PRN
  Administered 2023-06-15: 15 mL via INTRAOCULAR

## 2023-06-15 MED ORDER — TETRACAINE HCL 0.5 % OP SOLN
OPHTHALMIC | Status: AC
  Filled 2023-06-15: qty 4

## 2023-06-15 MED ORDER — BRIMONIDINE TARTRATE-TIMOLOL 0.2-0.5 % OP SOLN
OPHTHALMIC | Status: DC | PRN
  Administered 2023-06-15: 1 [drp] via OPHTHALMIC

## 2023-06-15 MED ORDER — SIGHTPATH DOSE#1 NA HYALUR & NA CHOND-NA HYALUR IO KIT
PACK | INTRAOCULAR | Status: DC | PRN
  Administered 2023-06-15: 1 via OPHTHALMIC

## 2023-06-15 MED ORDER — MIDAZOLAM HCL 2 MG/2ML IJ SOLN
INTRAMUSCULAR | Status: AC
  Filled 2023-06-15: qty 2

## 2023-06-15 MED ORDER — LIDOCAINE HCL (PF) 2 % IJ SOLN
INTRAOCULAR | Status: DC | PRN
  Administered 2023-06-15: 4 mL via INTRAOCULAR

## 2023-06-15 MED ORDER — MOXIFLOXACIN HCL 0.5 % OP SOLN
OPHTHALMIC | Status: DC | PRN
  Administered 2023-06-15: .2 mL via OPHTHALMIC

## 2023-06-15 MED ORDER — PHENYLEPHRINE-KETOROLAC 1-0.3 % IO SOLN
INTRAOCULAR | Status: DC | PRN
  Administered 2023-06-15: 73 mL via OPHTHALMIC

## 2023-06-15 SURGICAL SUPPLY — 14 items
CATARACT SUITE SIGHTPATH (MISCELLANEOUS) ×1 IMPLANT
DISSECTOR HYDRO NUCLEUS 50X22 (MISCELLANEOUS) ×1 IMPLANT
DRSG TEGADERM 2-3/8X2-3/4 SM (GAUZE/BANDAGES/DRESSINGS) ×1 IMPLANT
FEE CATARACT SUITE SIGHTPATH (MISCELLANEOUS) ×1 IMPLANT
GLOVE BIOGEL PI IND STRL 8 (GLOVE) ×1 IMPLANT
GLOVE SURG LX STRL 7.5 STRW (GLOVE) ×1 IMPLANT
GLOVE SURG PROTEXIS BL SZ6.5 (GLOVE) ×1 IMPLANT
GLOVE SURG SYN 6.5 PF PI BL (GLOVE) ×1 IMPLANT
LENS IOL DIOP 20.5 (Intraocular Lens) ×1 IMPLANT
LENS IOL TECNIS MONO 20.5 (Intraocular Lens) IMPLANT
NDL FILTER BLUNT 18X1 1/2 (NEEDLE) ×1 IMPLANT
NEEDLE FILTER BLUNT 18X1 1/2 (NEEDLE) ×1 IMPLANT
RING MALYGIN (MISCELLANEOUS) IMPLANT
SYR 3ML LL SCALE MARK (SYRINGE) ×1 IMPLANT

## 2023-06-15 NOTE — Op Note (Signed)
 OPERATIVE NOTE  Jason Jennings [id number] 06/15/2023   PREOPERATIVE DIAGNOSIS: Nuclear sclerotic cataract left eye. H25.12   POSTOPERATIVE DIAGNOSIS: Nuclear sclerotic cataract left eye. H25.12   PROCEDURE:  Phacoemusification with posterior chamber intraocular lens placement of the left eye  Ultrasound time: Procedure(s): PHACOEMULSIFICATION, CATARACT, WITH IOL INSERTION 7.15 00:40.8 (Left)  LENS:   Implant Name Type Inv. Item Serial No. Manufacturer Lot No. LRB No. Used Action  LENS IOL DIOP 20.5 - [id number] Intraocular Lens LENS IOL DIOP 20.5 [id number] SIGHTPATH  Left 1 Implanted      SURGEON:  Rosy Cooper. Donalda Fruit, MD   ANESTHESIA:  Topical with tetracaine  drops, augmented with 1% preservative-free intracameral lidocaine .   COMPLICATIONS:  None.   DESCRIPTION OF PROCEDURE:  The patient was identified in the holding room and transported to the operating room and placed in the supine position under the operating microscope.  The left eye was identified as the operative eye, which was prepped and draped in the usual sterile ophthalmic fashion.   A 1 millimeter clear-corneal paracentesis was made inferotemporally. Preservative-free 1% lidocaine  mixed with 1:1,000 bisulfite-free aqueous solution of epinephrine  was injected into the anterior chamber. The anterior chamber was then filled with Viscoat viscoelastic. A 2.4 millimeter keratome was used to make a clear-corneal incision superotemporally. The pupil remained miotic despite viscodilation, so a 6.25 mm Malyugin ring was inserted and engaged the pupil margin. A curvilinear capsulorrhexis was made with a cystotome and capsulorrhexis forceps. Balanced salt  solution was used to hydrodissect and hydrodelineate the nucleus. Phacoemulsification was then used to remove the lens nucleus and epinucleus. The remaining cortex was then removed using the irrigation and aspiration handpiece. Provisc was then placed into the capsular bag to distend it  for lens placement. An intraocular lens was then injected into the capsular bag. The Malyugin ring was removed from the eye.The remaining viscoelastic was aspirated.   Wounds were hydrated with balanced salt  solution.  The anterior chamber was inflated to a physiologic pressure with balanced salt  solution.  No wound leaks were noted. Moxifloxacin  was injected intracamerally.  Timolol  and Brimonidine  drops were applied to the eye.  The patient was taken to the recovery room in stable condition without complications of anesthesia or surgery.  Jason Jennings 06/15/2023, 1:07 PM

## 2023-06-15 NOTE — H&P (Signed)
 [facility]   Primary Care Physician:  Nicholaus Bark, MD Ophthalmologist: Dr. Meg Spina  Pre-Procedure History & Physical: HPI:  Jason Jennings is a 58 y.o. male here for cataract surgery.   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 Authorizing Provider  gabapentin  (NEURONTIN ) 100 MG capsule Take 100 mg by mouth 3 (three) times daily. 08/21/18   [provider]  HUMALOG MIX 75/25 KWIKPEN (75-25) 100 UNIT/ML KwikPen Inject 30 Units into the skin 2 (two) times daily with a meal. Qam and in the evening.      (He takes both Humalog mix 75/25 BID and then Lantus  at bedtime)    [provider]  insulin  glargine (LANTUS  SOLOSTAR) 100 UNIT/ML Solostar Pen Inject 14 Units into the skin daily.    [provider]  losartan  (COZAAR ) 50 MG tablet Take 50 mg by mouth daily.    [provider]  methocarbamol (ROBAXIN) 750 MG tablet Take 750 mg by mouth 2 (two) times daily as needed. 12/28/22   [provider]  Oxycodone  HCl 10 MG TABS Take 10  mg by mouth every 6 (six) hours as needed. Patient not taking: Reported on 06/13/2023 12/23/22   [provider]  rosuvastatin  (CRESTOR ) 20 MG tablet Take 20 mg by mouth daily. 01/24/18   [provider]  tamsulosin  (FLOMAX ) 0.4 MG CAPS capsule Take 0.4 mg by mouth daily.    [provider]  testosterone cypionate (DEPOTESTOTERONE CYPIONATE) 100 MG/ML injection INJECT 1/2 (ONE-HALF) ML INTRAMUSCULARLY EVERY TWO WEEKS (DISCARD VIAL AFTER 30 DAYS) 03/29/23   [provider]  torsemide (DEMADEX) 10 MG tablet Take 10 mg by mouth every Monday, Wednesday, Friday, and Saturday at 6 PM.    [provider]    Allergies as of 04/11/2023   (No Known Allergies)    History reviewed. No pertinent family history.  Social History   Socioeconomic History   Marital status: Single    Spouse name: Not on file   Number of children: Not on file   Years of education: Not on file   Highest education level: Not on file  Occupational History   Not on file  Tobacco Use   Smoking status: Former    Current packs/day: 0.00    Types: Cigarettes    Quit date: 12/09/2014    Years since quitting: 8.5   Smokeless tobacco: Never  Vaping Use   Vaping status: Never Used  Substance and Sexual Activity   Alcohol use: Not Currently  Drug use: Not Currently    Types: Marijuana   Sexual activity: Not on file  Other Topics Concern   Not on file  Social History Narrative   Not on file   Social Drivers of Health   Financial Resource Strain: Medium Risk (05/25/2020)   Received from [facility], [facility]   Overall Financial Resource Strain (CARDIA)    Difficulty of Paying Living Expenses: Somewhat hard  Food Insecurity: No Food Insecurity (05/01/2023)   Hunger Vital Sign    Worried About Running Out of Food in the Last Year: Never true    Ran Out of Food in the Last Year: Never true  Transportation Needs: No Transportation Needs (01/29/2023)   PRAPARE -  Administrator, Civil Service (Medical): No    Lack of Transportation (Non-Medical): No  Physical Activity: Insufficiently Active (05/25/2020)   Received from [facility], [facility]   Exercise Vital Sign    Days of Exercise per Week: 3 days    Minutes of Exercise per Session: 10 min  Stress: No Stress Concern Present (05/25/2020)   Received from [facility], [facility] of Occupational Health - Occupational Stress Questionnaire    Feeling of Stress : Only a little  Social Connections: Unknown (06/21/2021)   Received from [facility], [facility]   Social Network    Social Network: Not on file  Intimate Partner Violence: Not At Risk (01/29/2023)   Humiliation, Afraid, Rape, and Kick questionnaire    Fear of Current or Ex-Partner: No    Emotionally Abused: No    Physically Abused: No    Sexually Abused: No    Review of Systems: See HPI, otherwise negative ROS  Physical Exam: Ht 5' 5.98" (1.676 m)   Wt 91.9 kg   BMI 32.74 kg/m  General:   Alert, cooperative in NAD Head:  Normocephalic and atraumatic. Respiratory:  Normal work of breathing. Cardiovascular:  RRR  Impression/Plan: Jason Jennings is here for cataract surgery.  Risks, benefits, limitations, and alternatives regarding cataract surgery have been reviewed with the patient.  Questions have been answered.  All parties agreeable.   Trudi Fus, MD  06/15/2023, 7:14 AM

## 2023-06-15 NOTE — Progress Notes (Signed)
 Diabetes Self-Management Education  Visit Type:  First/Initial  Appt. Start Time: 1115  Appt. End Time: 1153  06/19/2023  [name], identified by name and date of birth, is a 58 y.o. male with a diagnosis of Diabetes: Type 2.     ASSESSMENT  Patient is here today alone. Pt reports he is limiting beef for the most part successfully. Pt reports she has switched from canned beans to dried beans and feels this is a good change. Pt reports she continues to aim to use less sodium when cooking. Pt reports a recent value obtained of "68" mg/dL with symptoms of sweating and tired and states eating two banana to treat hypoglycemia. RD reviewed the rule of 15 and encouraged Pt to carry fast acting sugar. Pt reports this most recent A1C if 8%. Pt reports he has started the gym aiming for 2-3 days weekly. Pt reports his appetite is decreased and was encouraged to avoid skipping meals. Pt reports his appetite. Pt reports his water intake varies from 32-64 oz daily. Pt reports his highest blood sugar is "210" mg/dL upon fasting and feels this was related to "eating out". All Pt's questions were answered during this encounter.   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 Qam and in the evening.      (He takes both Humalog mix 75/25 BID and then Lantus  at bedtime) Pt reports taking to 25 units BID, Disp: , Rfl:    insulin  glargine (LANTUS  SOLOSTAR) 100 UNIT/ML Solostar Pen, Inject 14 Units into the skin daily. Pt reports taking 12 units once daily, Disp: , Rfl:    losartan  (COZAAR ) 50 MG tablet, Take 50 mg by mouth daily., Disp: , Rfl:    methocarbamol (ROBAXIN) 750 MG tablet, Take 750 mg by mouth 2 (two) times daily as needed., Disp: , Rfl:    Oxycodone  HCl 10 MG TABS, Take 10 mg by mouth every 6 (six) hours as needed. (Patient not taking: Reported on 06/13/2023), Disp: , Rfl:    rosuvastatin  (CRESTOR ) 20 MG tablet, Take 20 mg by mouth daily., Disp: , Rfl:    tamsulosin  (FLOMAX ) 0.4 MG CAPS capsule, Take 0.4 mg by mouth daily., Disp: , Rfl:    testosterone cypionate (DEPOTESTOTERONE CYPIONATE) 100 MG/ML injection, INJECT 1/2 (ONE-HALF) ML INTRAMUSCULARLY EVERY TWO WEEKS (DISCARD VIAL AFTER 30 DAYS), Disp: , Rfl:    torsemide (DEMADEX) 10 MG tablet, Take 10 mg by mouth every Monday, Wednesday, Friday, and Saturday at 6 PM., Disp: , Rfl:    Labs noted:   Lab Results  Component Value Date   HGBA1C 11.4 (H) 01/28/2023   Lab Results  Component Value Date   NA 141 03/26/2023   CL 104 03/26/2023   K 3.4 (L) 03/26/2023   CO2 24 03/26/2023   BUN 39 (H) 03/26/2023   CREATININE 2.14 (H) 03/26/2023   GFRNONAA 35 (L) 03/26/2023   CALCIUM  9.2 03/26/2023   ALBUMIN 3.4 (L) 03/26/2023   GLUCOSE 123 (H) 03/26/2023   Wt Readings from Last 3 Encounters:  06/15/23 199 lb (90.3 kg)  06/01/23 202 lb 11.2 oz (91.9 kg)  03/26/23 200 lb (90.7 kg)   There were no vitals taken for this visit. There is no height or weight on file to calculate BMI.    Diabetes Self-Management Education - 06/19/23 1000       Health Coping   How would you rate your overall health? Fair      Pre-Education  Assessment   Patient understands the diabetes disease and treatment process. Needs Review    Patient understands incorporating nutritional management into lifestyle. Comprehends key points    Patient undertands incorporating physical activity into lifestyle. Needs Review    Patient understands using medications safely. Needs Review    Patient understands monitoring blood glucose, interpreting and using results Comprehends key points    Patient understands prevention, detection, and treatment of acute complications. Needs Review    Patient understands prevention, detection, and treatment of chronic complications. Needs Review    Patient understands how to develop strategies to address psychosocial issues. Needs Review    Patient understands how to develop strategies to promote health/change behavior. Needs Review      Complications   How often do you check your blood sugar? 3-4 times/day    Fasting Blood glucose range (mg/dL) >409    Postprandial Blood glucose range (mg/dL) <81    Number of hypoglycemic episodes per month 1    Can you tell when your blood sugar is low? Yes    What do you do if your blood sugar is low? weak    Number of hyperglycemic episodes ( >200mg /dL): Occasional    Can you tell when your blood sugar is high? Yes    Have you had a dilated eye exam in the past 12 months? Yes    Have you had a dental exam in the past 12 months? Yes    Are you checking your feet? Yes    How many days per week are you checking your feet? 7      Dietary Intake   Breakfast skips    Lunch skip    Dinner quinoa, navy beans, tuna    Beverage(s) water, coffee with equal      Activity / Exercise   Activity / Exercise Type Moderate (swimming / aerobic walking)    How many days per week do you exercise? 2    How many minutes per day do you exercise? 60    Total minutes per week of exercise 120      Patient Education   Previous Diabetes Education Yes (please comment)    Disease  Pathophysiology Explored patient's options for treatment of their diabetes    Healthy Eating Carbohydrate counting    Being Active Role of exercise on diabetes management, blood pressure control and cardiac health.    Medications Reviewed patients medication for diabetes, action, purpose, timing of dose and side effects.    Monitoring Identified appropriate SMBG and/or A1C goals.    Acute complications Taught prevention, symptoms,  and  treatment of hypoglycemia - the 15 rule.    Chronic complications Nephropathy, what it is, prevention of, the use of ACE, ARB's and early detection of through urine microalbumia.    Diabetes Stress and Support Worked with patient to identify barriers to care and solutions    Lifestyle and Health Coping Lifestyle issues that need to be addressed for better diabetes care      Individualized Goals (developed by patient)   Nutrition General guidelines for healthy choices and portions discussed    Physical Activity Exercise 3-5 times per week    Medications take my medication as prescribed    Monitoring  Test my blood glucose as discussed    Problem Solving Addressing barriers to behavior change    Reducing Risk treat hypoglycemia with 15 grams of carbs if blood glucose less than 70mg /dL;examine blood glucose patterns    Health Coping Discuss barriers to diabetes care with support person/system (comment specifics as needed)      Post-Education Assessment   Patient understands the diabetes disease and treatment process. Comprehends key points    Patient understands incorporating nutritional management into lifestyle. Comprehends key points    Patient undertands incorporating physical activity into lifestyle. Comprehends key points    Patient understands using medications safely. Comphrehends key points    Patient understands monitoring blood glucose, interpreting and using results Needs Review    Patient understands prevention, detection, and treatment of acute  complications. Needs Review    Patient understands prevention, detection, and treatment of chronic complications. Needs Review    Patient understands how to develop strategies to address psychosocial issues. Needs Review    Patient understands how to develop strategies to promote health/change behavior. Needs Review      Outcomes   Program Status Not Completed             Learning Objective:  Patient will have a greater understanding of diabetes self-management. Patient education plan is to attend individual and/or group sessions per assessed needs and concerns.   Plan:   Patient Instructions  1- Continue or balanced meals and snacks  2- Aim for 64 ounces of water daily 3-Carry a fast acting sugar source with you at all time   Expected Outcomes:  Demonstrated interest in learning. Expect positive outcomes  Education material provided: reviewed previous provided materials with patient  If problems or questions, patient to contact team via:  Phone  Future DSME appointment: - 3-4 months

## 2023-06-15 NOTE — Transfer of Care (Signed)
 Immediate Anesthesia Transfer of Care Note  Patient: Jason Jennings  Procedure(s) Performed: PHACOEMULSIFICATION, CATARACT, WITH IOL INSERTION 7.15 00:40.8 (Left: Eye)  Patient Location: PACU  Anesthesia Type: MAC  Level of Consciousness: awake, alert  and patient cooperative  Airway and Oxygen Therapy: Patient Spontanous Breathing and Patient connected to supplemental oxygen  Post-op Assessment: Post-op Vital signs reviewed, Patient's Cardiovascular Status Stable, Respiratory Function Stable, Patent Airway and No signs of Nausea or vomiting  Post-op Vital Signs: Reviewed and stable  Complications: No notable events documented.

## 2023-06-15 NOTE — Anesthesia Postprocedure Evaluation (Signed)
 Anesthesia Post Note  Patient: Kenna Fogelman  Procedure(s) Performed: PHACOEMULSIFICATION, CATARACT, WITH IOL INSERTION 7.15 00:40.8 (Left: Eye)  Patient location during evaluation: PACU Anesthesia Type: MAC Level of consciousness: awake and alert, oriented and patient cooperative Pain management: pain level controlled Vital Signs Assessment: post-procedure vital signs reviewed and stable Respiratory status: spontaneous breathing, nonlabored ventilation and respiratory function stable Cardiovascular status: blood pressure returned to baseline and stable Postop Assessment: adequate PO intake Anesthetic complications: no   No notable events documented.   Last Vitals:  Vitals:   06/15/23 1309 06/15/23 1315  BP: (!) 155/81 (!) 143/73  Pulse: 62 (!) 56  Resp: 14 16  Temp: (!) 35.9 C (!) 35.9 C  SpO2: 98% 100%    Last Pain:  Vitals:   06/15/23 1315  PainSc: 0-No pain                 Dorothey Gate

## 2023-06-19 ENCOUNTER — Encounter: Attending: Internal Medicine | Admitting: Dietician

## 2023-06-19 DIAGNOSIS — E11649 Type 2 diabetes mellitus with hypoglycemia without coma: Secondary | ICD-10-CM | POA: Insufficient documentation

## 2023-06-19 NOTE — Patient Instructions (Addendum)
 1- Continue or balanced meals and snacks  2- Aim for 64 ounces of water daily 3-Carry a fast acting sugar source with you at all time

## 2023-08-12 ENCOUNTER — Other Ambulatory Visit: Payer: Self-pay

## 2023-08-12 ENCOUNTER — Inpatient Hospital Stay

## 2023-08-12 ENCOUNTER — Inpatient Hospital Stay
Admission: EM | Admit: 2023-08-12 | Discharge: 2023-08-20 | DRG: 854 | Disposition: A | Discharge status: Home Health | Attending: Osteopathic Medicine | Admitting: Osteopathic Medicine

## 2023-08-12 ENCOUNTER — Emergency Department

## 2023-08-12 DIAGNOSIS — Z85038 Personal history of other malignant neoplasm of large intestine: Secondary | ICD-10-CM | POA: Diagnosis present

## 2023-08-12 DIAGNOSIS — I129 Hypertensive chronic kidney disease with stage 1 through stage 4 chronic kidney disease, or unspecified chronic kidney disease: Secondary | ICD-10-CM | POA: Diagnosis present

## 2023-08-12 DIAGNOSIS — N1832 Chronic kidney disease, stage 3b: Secondary | ICD-10-CM | POA: Diagnosis present

## 2023-08-12 DIAGNOSIS — Z6830 Body mass index (BMI) 30.0-30.9, adult: Secondary | ICD-10-CM | POA: Diagnosis not present

## 2023-08-12 DIAGNOSIS — E785 Hyperlipidemia, unspecified: Secondary | ICD-10-CM | POA: Diagnosis present

## 2023-08-12 DIAGNOSIS — L02612 Cutaneous abscess of left foot: Secondary | ICD-10-CM | POA: Diagnosis present

## 2023-08-12 DIAGNOSIS — E1122 Type 2 diabetes mellitus with diabetic chronic kidney disease: Secondary | ICD-10-CM | POA: Diagnosis present

## 2023-08-12 DIAGNOSIS — N179 Acute kidney failure, unspecified: Secondary | ICD-10-CM | POA: Diagnosis present

## 2023-08-12 DIAGNOSIS — Z9221 Personal history of antineoplastic chemotherapy: Secondary | ICD-10-CM | POA: Diagnosis not present

## 2023-08-12 DIAGNOSIS — E11621 Type 2 diabetes mellitus with foot ulcer: Secondary | ICD-10-CM | POA: Diagnosis present

## 2023-08-12 DIAGNOSIS — L02619 Cutaneous abscess of unspecified foot: Secondary | ICD-10-CM | POA: Diagnosis present

## 2023-08-12 DIAGNOSIS — Z923 Personal history of irradiation: Secondary | ICD-10-CM | POA: Diagnosis not present

## 2023-08-12 DIAGNOSIS — E1140 Type 2 diabetes mellitus with diabetic neuropathy, unspecified: Secondary | ICD-10-CM | POA: Diagnosis present

## 2023-08-12 DIAGNOSIS — E1142 Type 2 diabetes mellitus with diabetic polyneuropathy: Secondary | ICD-10-CM | POA: Diagnosis present

## 2023-08-12 DIAGNOSIS — N400 Enlarged prostate without lower urinary tract symptoms: Secondary | ICD-10-CM | POA: Diagnosis present

## 2023-08-12 DIAGNOSIS — E1169 Type 2 diabetes mellitus with other specified complication: Secondary | ICD-10-CM | POA: Diagnosis present

## 2023-08-12 DIAGNOSIS — N2581 Secondary hyperparathyroidism of renal origin: Secondary | ICD-10-CM | POA: Diagnosis present

## 2023-08-12 DIAGNOSIS — Z87891 Personal history of nicotine dependence: Secondary | ICD-10-CM | POA: Diagnosis not present

## 2023-08-12 DIAGNOSIS — E66811 Obesity, class 1: Secondary | ICD-10-CM | POA: Diagnosis present

## 2023-08-12 DIAGNOSIS — L97509 Non-pressure chronic ulcer of other part of unspecified foot with unspecified severity: Secondary | ICD-10-CM | POA: Diagnosis present

## 2023-08-12 DIAGNOSIS — L03116 Cellulitis of left lower limb: Secondary | ICD-10-CM | POA: Diagnosis present

## 2023-08-12 DIAGNOSIS — E11319 Type 2 diabetes mellitus with unspecified diabetic retinopathy without macular edema: Secondary | ICD-10-CM | POA: Diagnosis present

## 2023-08-12 DIAGNOSIS — E1165 Type 2 diabetes mellitus with hyperglycemia: Secondary | ICD-10-CM | POA: Diagnosis present

## 2023-08-12 DIAGNOSIS — L03119 Cellulitis of unspecified part of limb: Secondary | ICD-10-CM | POA: Diagnosis present

## 2023-08-12 DIAGNOSIS — A4102 Sepsis due to Methicillin resistant Staphylococcus aureus: Secondary | ICD-10-CM | POA: Diagnosis present

## 2023-08-12 DIAGNOSIS — E876 Hypokalemia: Secondary | ICD-10-CM | POA: Diagnosis not present

## 2023-08-12 DIAGNOSIS — A419 Sepsis, unspecified organism: Principal | ICD-10-CM

## 2023-08-12 DIAGNOSIS — Z85048 Personal history of other malignant neoplasm of rectum, rectosigmoid junction, and anus: Secondary | ICD-10-CM

## 2023-08-12 DIAGNOSIS — I10 Essential (primary) hypertension: Secondary | ICD-10-CM | POA: Diagnosis present

## 2023-08-12 DIAGNOSIS — L97522 Non-pressure chronic ulcer of other part of left foot with fat layer exposed: Secondary | ICD-10-CM | POA: Diagnosis present

## 2023-08-12 DIAGNOSIS — Z79899 Other long term (current) drug therapy: Secondary | ICD-10-CM | POA: Diagnosis not present

## 2023-08-12 DIAGNOSIS — M7989 Other specified soft tissue disorders: Secondary | ICD-10-CM | POA: Diagnosis present

## 2023-08-12 DIAGNOSIS — Z794 Long term (current) use of insulin: Secondary | ICD-10-CM

## 2023-08-12 DIAGNOSIS — L0390 Cellulitis, unspecified: Secondary | ICD-10-CM | POA: Diagnosis present

## 2023-08-12 LAB — CBC WITH DIFFERENTIAL/PLATELET
Abs Immature Granulocytes: 0.08 K/uL — ABNORMAL HIGH (ref 0.00–0.07)
Basophils Absolute: 0 K/uL (ref 0.0–0.1)
Basophils Relative: 0 %
Eosinophils Absolute: 0 K/uL (ref 0.0–0.5)
Eosinophils Relative: 0 %
HCT: 34.3 % — ABNORMAL LOW (ref 39.0–52.0)
Hemoglobin: 12 g/dL — ABNORMAL LOW (ref 13.0–17.0)
Immature Granulocytes: 1 %
Lymphocytes Relative: 5 %
Lymphs Abs: 0.6 K/uL — ABNORMAL LOW (ref 0.7–4.0)
MCH: 29.2 pg (ref 26.0–34.0)
MCHC: 35 g/dL (ref 30.0–36.0)
MCV: 83.5 fL (ref 80.0–100.0)
Monocytes Absolute: 0.8 K/uL (ref 0.1–1.0)
Monocytes Relative: 7 %
Neutro Abs: 10.6 K/uL — ABNORMAL HIGH (ref 1.7–7.7)
Neutrophils Relative %: 87 %
Platelets: 172 K/uL (ref 150–400)
RBC: 4.11 MIL/uL — ABNORMAL LOW (ref 4.22–5.81)
RDW: 13 % (ref 11.5–15.5)
WBC: 12.1 K/uL — ABNORMAL HIGH (ref 4.0–10.5)
nRBC: 0 % (ref 0.0–0.2)

## 2023-08-12 LAB — COMPREHENSIVE METABOLIC PANEL WITH GFR
ALT: 15 U/L (ref 0–44)
AST: 32 U/L (ref 15–41)
Albumin: 3.3 g/dL — ABNORMAL LOW (ref 3.5–5.0)
Alkaline Phosphatase: 73 U/L (ref 38–126)
Anion gap: 13 (ref 5–15)
BUN: 51 mg/dL — ABNORMAL HIGH (ref 6–20)
CO2: 25 mmol/L (ref 22–32)
Calcium: 11.1 mg/dL — ABNORMAL HIGH (ref 8.9–10.3)
Chloride: 94 mmol/L — ABNORMAL LOW (ref 98–111)
Creatinine, Ser: 3.25 mg/dL — ABNORMAL HIGH (ref 0.61–1.24)
GFR, Estimated: 21 mL/min — ABNORMAL LOW (ref >60)
Glucose, Bld: 401 mg/dL — ABNORMAL HIGH (ref 70–99)
Potassium: 3.8 mmol/L (ref 3.5–5.1)
Sodium: 132 mmol/L — ABNORMAL LOW (ref 135–145)
Total Bilirubin: 0.8 mg/dL (ref 0.0–1.2)
Total Protein: 6.3 g/dL — ABNORMAL LOW (ref 6.5–8.1)

## 2023-08-12 LAB — GLUCOSE, CAPILLARY: Glucose-Capillary: 334 mg/dL — ABNORMAL HIGH (ref 70–99)

## 2023-08-12 LAB — LACTIC ACID, PLASMA: Lactic Acid, Venous: 1.9 mmol/L (ref 0.5–1.9)

## 2023-08-12 MED ORDER — SODIUM CHLORIDE 0.9 % IV SOLN: 2.0000 g | Freq: Once | INTRAVENOUS | Status: DC

## 2023-08-12 MED ORDER — VANCOMYCIN HCL IN DEXTROSE 1-5 GM/200ML-% IV SOLN: 1000.0000 mg | Freq: Once | INTRAVENOUS | Status: DC

## 2023-08-12 MED ORDER — INSULIN LISPRO PROT & LISPRO (75-25 MIX) 100 UNIT/ML KWIKPEN: 30.0000 [IU] | PEN_INJECTOR | Freq: Two times a day (BID) | SUBCUTANEOUS | Status: DC

## 2023-08-12 MED ORDER — ACETAMINOPHEN 325 MG PO TABS: 650.0000 mg | ORAL_TABLET | Freq: Four times a day (QID) | ORAL | Status: DC | PRN

## 2023-08-12 MED ORDER — METOPROLOL TARTRATE 5 MG/5ML IV SOLN: 5.0000 mg | Freq: Four times a day (QID) | INTRAVENOUS | Status: DC | PRN

## 2023-08-12 MED ORDER — HEPARIN SODIUM (PORCINE) 5000 UNIT/ML IJ SOLN
5000.0000 [IU] | Freq: Three times a day (TID) | INTRAMUSCULAR | Status: DC
  Administered 2023-08-12 – 2023-08-20 (×23): 5000 [IU] via SUBCUTANEOUS
  Filled 2023-08-12 (×22): qty 1

## 2023-08-12 MED ORDER — POLYETHYLENE GLYCOL 3350 17 G PO PACK: 17.0000 g | PACK | Freq: Every day | ORAL | Status: DC | PRN

## 2023-08-12 MED ORDER — ONDANSETRON HCL 4 MG/2ML IJ SOLN
4.0000 mg | Freq: Once | INTRAMUSCULAR | Status: AC
  Administered 2023-08-12: 4 mg via INTRAVENOUS
  Filled 2023-08-12: qty 2

## 2023-08-12 MED ORDER — SODIUM CHLORIDE 0.9 % IV BOLUS
1000.0000 mL | Freq: Once | INTRAVENOUS | Status: AC
  Administered 2023-08-12: 1000 mL via INTRAVENOUS

## 2023-08-12 MED ORDER — LACTATED RINGERS IV SOLN: INTRAVENOUS | Status: AC

## 2023-08-12 MED ORDER — GABAPENTIN 100 MG PO CAPS
100.0000 mg | ORAL_CAPSULE | Freq: Three times a day (TID) | ORAL | Status: DC
  Administered 2023-08-12 – 2023-08-20 (×23): 100 mg via ORAL
  Filled 2023-08-12 (×23): qty 1

## 2023-08-12 MED ORDER — OXYCODONE HCL 5 MG PO TABS
5.0000 mg | ORAL_TABLET | ORAL | Status: DC | PRN
  Administered 2023-08-12: 5 mg via ORAL
  Filled 2023-08-12: qty 1

## 2023-08-12 MED ORDER — SODIUM CHLORIDE 0.9 % IV SOLN
2.0000 g | Freq: Once | INTRAVENOUS | Status: AC
  Administered 2023-08-12: 2 g via INTRAVENOUS
  Filled 2023-08-12: qty 20

## 2023-08-12 MED ORDER — ONDANSETRON HCL 4 MG PO TABS: 8.0000 mg | ORAL_TABLET | Freq: Four times a day (QID) | ORAL | Status: DC | PRN

## 2023-08-12 MED ORDER — TAMSULOSIN HCL 0.4 MG PO CAPS
0.4000 mg | ORAL_CAPSULE | Freq: Every day | ORAL | Status: DC
  Administered 2023-08-12 – 2023-08-20 (×9): 0.4 mg via ORAL
  Filled 2023-08-12 (×9): qty 1

## 2023-08-12 MED ORDER — SODIUM CHLORIDE 0.9 % IV SOLN: 8.0000 mg | Freq: Four times a day (QID) | INTRAVENOUS | Status: DC | PRN

## 2023-08-12 MED ORDER — VANCOMYCIN HCL 1750 MG/350ML IV SOLN
1750.0000 mg | Freq: Once | INTRAVENOUS | Status: AC
  Administered 2023-08-12: 1750 mg via INTRAVENOUS
  Filled 2023-08-12 (×2): qty 350

## 2023-08-12 MED ORDER — ROSUVASTATIN CALCIUM 20 MG PO TABS
20.0000 mg | ORAL_TABLET | Freq: Every day | ORAL | Status: DC
  Administered 2023-08-13 – 2023-08-20 (×8): 20 mg via ORAL
  Filled 2023-08-12 (×8): qty 1

## 2023-08-12 MED ORDER — ACETAMINOPHEN 650 MG RE SUPP: 650.0000 mg | Freq: Four times a day (QID) | RECTAL | Status: DC | PRN

## 2023-08-12 MED ORDER — METOCLOPRAMIDE HCL 5 MG/ML IJ SOLN
10.0000 mg | Freq: Four times a day (QID) | INTRAMUSCULAR | Status: DC
  Administered 2023-08-12 – 2023-08-19 (×9): 10 mg via INTRAVENOUS
  Filled 2023-08-12 (×15): qty 2

## 2023-08-12 MED ORDER — INSULIN ASPART 100 UNIT/ML IJ SOLN
0.0000 [IU] | Freq: Three times a day (TID) | INTRAMUSCULAR | Status: DC
  Administered 2023-08-13: 7 [IU] via SUBCUTANEOUS
  Administered 2023-08-13 – 2023-08-14 (×3): 5 [IU] via SUBCUTANEOUS
  Administered 2023-08-14: 3 [IU] via SUBCUTANEOUS
  Administered 2023-08-14: 1 [IU] via SUBCUTANEOUS
  Administered 2023-08-15: 2 [IU] via SUBCUTANEOUS
  Administered 2023-08-15: 1 [IU] via SUBCUTANEOUS
  Administered 2023-08-15: 2 [IU] via SUBCUTANEOUS
  Administered 2023-08-16: 3 [IU] via SUBCUTANEOUS
  Administered 2023-08-16: 2 [IU] via SUBCUTANEOUS
  Administered 2023-08-17: 1 [IU] via SUBCUTANEOUS
  Administered 2023-08-17: 2 [IU] via SUBCUTANEOUS
  Administered 2023-08-17: 1 [IU] via SUBCUTANEOUS
  Administered 2023-08-18 – 2023-08-19 (×3): 2 [IU] via SUBCUTANEOUS
  Administered 2023-08-19: 5 [IU] via SUBCUTANEOUS
  Administered 2023-08-19: 2 [IU] via SUBCUTANEOUS
  Administered 2023-08-20: 1 [IU] via SUBCUTANEOUS
  Filled 2023-08-12 (×20): qty 1

## 2023-08-12 MED ORDER — METHOCARBAMOL 500 MG PO TABS: 750.0000 mg | ORAL_TABLET | Freq: Two times a day (BID) | ORAL | Status: DC | PRN

## 2023-08-12 MED ORDER — AMLODIPINE BESYLATE 5 MG PO TABS
5.0000 mg | ORAL_TABLET | Freq: Every day | ORAL | Status: DC
  Administered 2023-08-12 – 2023-08-20 (×9): 5 mg via ORAL
  Filled 2023-08-12 (×9): qty 1

## 2023-08-12 MED ORDER — VANCOMYCIN VARIABLE DOSE PER UNSTABLE RENAL FUNCTION (PHARMACIST DOSING): Status: DC

## 2023-08-12 MED ORDER — FENTANYL CITRATE PF 50 MCG/ML IJ SOSY: 12.5000 ug | PREFILLED_SYRINGE | INTRAMUSCULAR | Status: DC | PRN

## 2023-08-12 NOTE — ED Notes (Signed)
 Hailey RN aware of assigned bed

## 2023-08-12 NOTE — ED Triage Notes (Signed)
 Pt to ed from home via POV for leg infection. Pt has neuropathy in feet. Pt was admitted in December for same. He had a piece of metal in his foot then but didn't know due to his neuropathy. Pt is caox4, in no acute distress in triage. They removed the metal. Pt has redness spreading up his left leg.

## 2023-08-12 NOTE — Assessment & Plan Note (Signed)
 Baseline creatinine is around 2.3 Today's 3.25 Holding ARB Avoid nephrotoxic agents IV hydration Trend

## 2023-08-12 NOTE — Progress Notes (Signed)
 MEWS Progress Note  Patient Details Name: Lyndell Allaire MRN: [id number] DOB: 1965/05/07 Today's Date: 08/12/2023   MEWS Flowsheet Documentation:  Assess: MEWS Score Temp: 99.2 F (37.3 C) BP: (!) 163/95 MAP (mmHg): 117 Pulse Rate: (!) 117 Resp: 20 Level of Consciousness: Alert SpO2: 98 % O2 Device: Room Air Assess: MEWS Score MEWS Temp: 0 MEWS Systolic: 0 MEWS Pulse: 2 MEWS RR: 0 MEWS LOC: 0 MEWS Score: 2 MEWS Score Color: Yellow Assess: SIRS CRITERIA SIRS Temperature : 0 SIRS Respirations : 0 SIRS Pulse: 1 SIRS WBC: 1 SIRS Score Sum : 2 SIRS Temperature : 0 SIRS Pulse: 1 SIRS Respirations : 0 SIRS WBC: 1 SIRS Score Sum : 2 Assess: if the MEWS score is Yellow or Red Were vital signs accurate and taken at a resting state?: Yes Does the patient meet 2 or more of the SIRS criteria?: Yes Does the patient have a confirmed or suspected source of infection?: Yes MEWS guidelines implemented : No, previously yellow, continue vital signs every 4 hours Notify: Charge Nurse/RN Name of Charge Nurse/RN Notified: Tolbert, RN      Lamar Satterfield 08/12/2023, 9:20 PM

## 2023-08-12 NOTE — ED Notes (Signed)
 First set of cultures and blue top sent down.

## 2023-08-12 NOTE — ED Provider Notes (Signed)
 [facility] Provider Note    Event Date/Time   First MD Initiated Contact with Patient 08/12/23 1600     (approximate)  History   Chief Complaint: Leg Swelling  HPI  Jason Jennings is a 58 y.o. male with a past medical history of diabetes, peripheral neuropathy, presents to the emergency department for worsening swelling redness of the left leg.  According to the patient back in December he was admitted to the hospital for a diabetic foot ulcer that turned into an abscess requiring IV antibiotics and surgical debridement.  Patient states he had been doing well until yesterday he noticed small amount of redness in the left leg that then progressed up the leg last night into today.  Denies any fever although states subjective chills today.  Found to have a low-grade borderline temp 99.6, tachycardic at 110 bpm.  Physical Exam   Triage Vital Signs: ED Triage Vitals  Encounter Vitals Group     BP 08/12/23 1528 (!) 145/74     Girls Systolic BP Percentile --      Girls Diastolic BP Percentile --      Boys Systolic BP Percentile --      Boys Diastolic BP Percentile --      Pulse Rate 08/12/23 1528 (!) 110     Resp 08/12/23 1528 18     Temp 08/12/23 1528 99.6 F (37.6 C)     Temp Source 08/12/23 1528 Oral     SpO2 08/12/23 1528 100 %     Weight --      Height 08/12/23 1529 5' 5 (1.651 m)     Head Circumference --      Peak Flow --      Pain Score 08/12/23 1529 9     Pain Loc --      Pain Education --      Exclude from Growth Chart --     Most recent vital signs: Vitals:   08/12/23 1528 08/12/23 1604  BP: (!) 145/74 (!) 187/95  Pulse: (!) 110   Resp: 18   Temp: 99.6 F (37.6 C)   SpO2: 100%     General: Awake, no distress.  CV:  Good peripheral perfusion.  Regular rate and rhythm around 100 bpm Resp:  Normal effort.  Equal breath sounds bilaterally.  Abd:  No distention.  Soft, nontender.  No rebound or guarding. Other:  Patient with erythema  and tenderness extending almost to the knee of the left lower extremity.  Patient does have a callused/healed ulcerated area to the plantar aspect of the left foot where he had a prior infection per patient.  Neurovascularly intact distally.  Warm foot.   ED Results / Procedures / Treatments   RADIOLOGY  I have reviewed and interpreted the x-ray images.  No obvious osteomyelitis seen on my examination. Radiology is read the x-ray as negative. Ultrasound negative for DVT  MEDICATIONS ORDERED IN ED: Medications  cefTRIAXone  (ROCEPHIN ) 2 g in sodium chloride  0.9 % 100 mL IVPB (has no administration in time range)     IMPRESSION / MDM / ASSESSMENT AND PLAN / ED COURSE  I reviewed the triage vital signs and the nursing notes.  Patient's presentation is most consistent with acute presentation with potential threat to life or bodily function.  Patient presents to the emergency department for redness and tenderness to the left lower extremity since last night that has spread rapidly.  History of cellulitis previously in this leg.  Patient  actively vomiting with nausea.  Patient is mildly tachycardic, borderline low-grade temperature.  Patient's lab work today shows leukocytosis of 12.1 meeting sepsis criteria.  Lactic acid of 1.9.  Will send blood cultures we will start the patient on broad-spectrum antibiotics.  Will dose Zofran  and IV fluids.  Given the redness and swelling of 1 leg we will obtain an ultrasound as a precaution although his clinical picture is much more suggestive of cellulitis.  Will obtain an x-ray of the foot to rule out osteomyelitis at the ulcerated/callused area.  Chemistry also shows acute renal insufficiency with a creatinine of 3.2 from a baseline closer to 2.1, patient receiving IV fluids.  Patient's x-rays negative for osteomyelitis ultrasound negative for DVT.  However given the patient's borderline temperature tachycardia leukocytosis consistent with sepsis with an  elevated lactic acid of 2.0 we will admit for continued IV antibiotics.  Patient agreeable to plan of care.   CRITICAL CARE Performed by: Franky Moores   Total critical care time: 30 minutes  Critical care time was exclusive of separately billable procedures and treating other patients.  Critical care was necessary to treat or prevent imminent or life-threatening deterioration.  Critical care was time spent personally by me on the following activities: development of treatment plan with patient and/or surrogate as well as nursing, discussions with consultants, evaluation of patient's response to treatment, examination of patient, obtaining history from patient or surrogate, ordering and performing treatments and interventions, ordering and review of laboratory studies, ordering and review of radiographic studies, pulse oximetry and re-evaluation of patient's condition.   FINAL CLINICAL IMPRESSION(S) / ED DIAGNOSES   Cellulitis left lower extremity Sepsis  Note:  This document was prepared using Dragon voice recognition software and may include unintentional dictation errors.   Moores Franky, MD 08/12/23 1742

## 2023-08-12 NOTE — ED Notes (Signed)
 This RN called lab to obtain 2nd set of cultures/ lactic. 1 set was collected In Triage

## 2023-08-12 NOTE — Assessment & Plan Note (Signed)
 Holding losartan  Calcium  channel blocker Lopressor  as needed

## 2023-08-12 NOTE — Sepsis Progress Note (Signed)
 Elink following code sepsis

## 2023-08-12 NOTE — Assessment & Plan Note (Addendum)
 See pics Will get MRI to rule out abscess here Consider podiatry consultation

## 2023-08-12 NOTE — Progress Notes (Signed)
 CODE SEPSIS - PHARMACY COMMUNICATION  **Broad Spectrum Antibiotics should be administered within 1 hour of Sepsis diagnosis**  Time Code Sepsis Called/Page Received: 1613  Antibiotics Ordered: ceftriaxone   Time of 1st antibiotic administration: 1652  Additional action taken by pharmacy: None  If necessary, Name of Provider/Nurse Contacted: None    Damien Napoleon ,PharmD Clinical Pharmacist  08/12/2023  4:17 PM

## 2023-08-12 NOTE — Hospital Course (Signed)
 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  He was in wound care for approximately 3 months following debridement and finally got closure in March 2025.  He reports a 1 day history of increasing pain and swelling and erythema in his lower leg.  He reports significant pain at the ball of his foot under the callus that he describes as feeling like an abscess under there.  He began having nausea and vomiting today as well.  In the ED he was noted to have significant cellulitis with negative x-ray and negative workup for DVT.  The swelling is his lower extremity including the ankle and outer part of his lower leg.  He had an elevated white count at 12.1 and elevated lactic acid at 2.  He was given IV fluid resuscitation and antibiotics and we were asked to admit.

## 2023-08-12 NOTE — Assessment & Plan Note (Signed)
 Patient is currently on insulin  7525 to 30 units twice daily Sugar was four 1 year Will add sliding scale

## 2023-08-12 NOTE — H&P (Signed)
 History and Physical    Patient: Jason Jennings FMW:[id number] DOB: 1965/07/22 DOA: 08/12/2023 DOS: the patient was seen and examined on 08/12/2023 PCP: Valere Ozell Shove, MD  Patient coming from: Home  Chief Complaint:  Chief Complaint  Patient presents with   Leg Swelling   HPI: Jason Jennings is a 58 y.o. 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  He was in wound care for approximately 3 months following debridement and finally got closure in March 2025.  He reports a 1 day history of increasing pain and swelling and erythema in his lower leg.  He reports significant pain at the ball of his foot under the callus that he describes as feeling like an abscess under there.  He began having nausea and vomiting today as well.  In the ED he was noted to have significant cellulitis with negative x-ray and negative workup for DVT.  The swelling is his lower extremity including the ankle and outer part of his lower leg.  He had an elevated white count at 12.1 and elevated lactic acid at 2.  He was given IV fluid resuscitation and antibiotics and we were asked to admit. Review of Systems: As mentioned in the history of present illness. All other systems reviewed and are negative. Past Medical History:  Diagnosis Date   Adenocarcinoma of rectum (HCC)    Adenocarcinoma of rectum (HCC)    Cancer (HCC)    Chronic kidney disease, stage 3b (HCC)    Diabetes mellitus due to underlying condition with diabetic autonomic neuropathy, unspecified whether long term insulin  use (HCC)    Diabetes mellitus without complication (HCC)    Diabetic polyneuropathy (HCC)    Diabetic retinopathy (HCC)    Neuropathy    S/P colostomy (HCC)    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)    Ulcer of left foot with fat layer exposed [facility])    Past Surgical History:  Procedure Laterality  Date   CATARACT EXTRACTION W/PHACO Right 06/01/2023   Procedure: PHACOEMULSIFICATION, CATARACT, WITH IOL INSERTION 7.85, 00:57.4;  Surgeon: Enola Feliciano Hugger, MD;  Location: [facility];  Service: Ophthalmology;  Laterality: Right;   CATARACT EXTRACTION W/PHACO Left 06/15/2023   Procedure: PHACOEMULSIFICATION, CATARACT, WITH IOL INSERTION 7.15 00:40.8;  Surgeon: Enola Feliciano Hugger, MD;  Location: [facility];  Service: Ophthalmology;  Laterality: Left;   COLON SURGERY     Social History:  reports that he quit smoking about 8 years ago. His smoking use included cigarettes. He has never used smokeless tobacco. He reports that he does not currently use alcohol. He reports that he does not currently use drugs after having used the following drugs: Marijuana.  No Known Allergies  History reviewed. No pertinent family history.  Prior to Admission medications   Medication Sig Start Date End Date Taking? Authorizing Provider  gabapentin  (NEURONTIN ) 100 MG capsule Take 100 mg by mouth 3 (three) times daily. 08/21/18   [provider]  HUMALOG  MIX 75/25 KWIKPEN (75-25) 100 UNIT/ML KwikPen Inject 30 Units into the skin 2 (two) times daily with a meal. Qam and in the evening.      (He takes both Humalog  mix 75/25 BID and then Lantus  at bedtime) Pt reports taking to 25 units BID    [provider]  insulin  glargine (LANTUS  SOLOSTAR) 100 UNIT/ML Solostar Pen Inject 14 Units  into the skin daily. Pt reports taking 12 units once daily    [provider]  losartan  (COZAAR ) 50 MG tablet Take 50 mg by mouth daily.    [provider]  methocarbamol  (ROBAXIN ) 750 MG tablet Take 750 mg by mouth 2 (two) times daily as needed. 12/28/22   [provider]  Oxycodone  HCl 10 MG TABS Take 10 mg by mouth every 6 (six) hours as needed. Patient not taking: Reported on 06/13/2023 12/23/22   [provider]  rosuvastatin  (CRESTOR ) 20 MG tablet Take 20 mg  by mouth daily. 01/24/18   [provider]  tamsulosin  (FLOMAX ) 0.4 MG CAPS capsule Take 0.4 mg by mouth daily.    [provider]  testosterone cypionate (DEPOTESTOTERONE CYPIONATE) 100 MG/ML injection INJECT 1/2 (ONE-HALF) ML INTRAMUSCULARLY EVERY TWO WEEKS (DISCARD VIAL AFTER 30 DAYS) 03/29/23   [provider]  torsemide (DEMADEX) 10 MG tablet Take 10 mg by mouth every Monday, Wednesday, Friday, and Saturday at 6 PM.    [provider]    Physical Exam: Vitals:   08/12/23 1528 08/12/23 1529 08/12/23 1604  BP: (!) 145/74  (!) 187/95  Pulse: (!) 110    Resp: 18    Temp: 99.6 F (37.6 C)    TempSrc: Oral    SpO2: 100%    Height:  5' 5 (1.651 m)    Physical Examination: General appearance - alert, well appearing, and in no distress Neck - supple, no significant adenopathy Chest -normal effort Heart -tachycardic rate Abdomen - soft, nontender, nondistended, no masses or organomegaly Extremities -erythema and calor noted on the lateral side of the lower extremity from ankle to almost the knee see photos There is a callus on the left foot underneath the fourth toe with some dark color changes underneath it       Data Reviewed:  Results for orders placed or performed during the hospital encounter of 08/12/23 (from the past 24 hours)  Lactic acid, plasma     Status: None   Collection Time: 08/12/23  3:34 PM  Result Value Ref Range   Lactic Acid, Venous 1.9 0.5 - 1.9 mmol/L  Comprehensive metabolic panel     Status: Abnormal   Collection Time: 08/12/23  3:34 PM  Result Value Ref Range   Sodium 132 (L) 135 - 145 mmol/L   Potassium 3.8 3.5 - 5.1 mmol/L   Chloride 94 (L) 98 - 111 mmol/L   CO2 25 22 - 32 mmol/L   Glucose, Bld 401 (H) 70 - 99 mg/dL   BUN 51 (H) 6 - 20 mg/dL   Creatinine, Ser 6.74 (H) 0.61 - 1.24 mg/dL   Calcium  11.1 (H) 8.9 - 10.3 mg/dL   Total Protein 6.3 (L) 6.5 - 8.1 g/dL   Albumin 3.3 (L) 3.5 - 5.0 g/dL   AST 32 15 - 41  U/L   ALT 15 0 - 44 U/L   Alkaline Phosphatase 73 38 - 126 U/L   Total Bilirubin 0.8 0.0 - 1.2 mg/dL   GFR, Estimated 21 (L) >60 mL/min   Anion gap 13 5 - 15  CBC with Differential     Status: Abnormal   Collection Time: 08/12/23  3:34 PM  Result Value Ref Range   WBC 12.1 (H) 4.0 - 10.5 K/uL   RBC 4.11 (L) 4.22 - 5.81 MIL/uL   Hemoglobin 12.0 (L) 13.0 - 17.0 g/dL   HCT 65.6 (L) 60.9 - 47.9 %   MCV 83.5 80.0 -  100.0 fL   MCH 29.2 26.0 - 34.0 pg   MCHC 35.0 30.0 - 36.0 g/dL   RDW 86.9 88.4 - 84.4 %   Platelets 172 150 - 400 K/uL   nRBC 0.0 0.0 - 0.2 %   Neutrophils Relative % 87 %   Neutro Abs 10.6 (H) 1.7 - 7.7 K/uL   Lymphocytes Relative 5 %   Lymphs Abs 0.6 (L) 0.7 - 4.0 K/uL   Monocytes Relative 7 %   Monocytes Absolute 0.8 0.1 - 1.0 K/uL   Eosinophils Relative 0 %   Eosinophils Absolute 0.0 0.0 - 0.5 K/uL   Basophils Relative 0 %   Basophils Absolute 0.0 0.0 - 0.1 K/uL   Immature Granulocytes 1 %   Abs Immature Granulocytes 0.08 (H) 0.00 - 0.07 K/uL  Lactic acid, plasma     Status: Abnormal   Collection Time: 08/12/23  5:10 PM  Result Value Ref Range   Lactic Acid, Venous 2.0 (HH) 0.5 - 1.9 mmol/L   US  Venous Img Lower Unilateral Left Result Date: 08/12/2023 CLINICAL DATA:  Redness and pain in the LEFT lower extremity EXAM: LEFT LOWER EXTREMITY VENOUS DOPPLER ULTRASOUND TECHNIQUE: Gray-scale sonography with compression, as well as color and duplex ultrasound, were performed to evaluate the deep venous system(s) from the level of the common femoral vein through the popliteal and proximal calf veins. COMPARISON:  None Available. FINDINGS: VENOUS Normal compressibility of the common femoral, superficial femoral, and popliteal veins, as well as the visualized calf veins. Visualized portions of profunda femoral vein and great saphenous vein unremarkable. No filling defects to suggest DVT on grayscale or color Doppler imaging. Doppler waveforms show normal direction of venous flow,  normal respiratory plasticity and response to augmentation. Limited views of the contralateral common femoral vein are unremarkable. OTHER None. Limitations: none IMPRESSION: No LEFT lower extremity deep venous thrombosis. Electronically Signed   By: Jackquline Boxer M.D.   On: 08/12/2023 17:01   DG Foot Complete Left Result Date: 08/12/2023 CLINICAL DATA:  Leg infection with neuropathy in feet. EXAM: LEFT FOOT - COMPLETE 3+ VIEW COMPARISON:  01/28/2023. FINDINGS: There is no evidence of acute fracture or dislocation. No periosteal elevation or bony erosion is seen. Degenerative changes are present at the first metatarsophalangeal joint, midfoot, and ankle. Vascular calcifications are noted in the soft tissues. Soft tissue swelling is noted about the ankle. IMPRESSION: No radiographic evidence of osteomyelitis. Electronically Signed   By: Leita Birmingham M.D.   On: 08/12/2023 16:37     Assessment and Plan: Cellulitis Check MRI to rule out osteomyelitis Begin vancomycin  Normal ABIs in January 2025  Acute renal failure superimposed on stage 3b chronic kidney disease (HCC) Baseline creatinine is around 2.3 Today's 3.25 Holding ARB Avoid nephrotoxic agents IV hydration Trend  Type 2 diabetes mellitus with hyperlipidemia (HCC) Patient is currently on insulin  7525 to 30 units twice daily Sugar was four 1 year Will add sliding scale  HTN (hypertension) Holding losartan  Calcium  channel blocker Lopressor  as needed  Neuropathy, diabetic (HCC) On gabapentin   H/O colon cancer, stage II Treated at [facility]  Status post neoadjuvant chemoradiation with low anterior resection in 2016. Recurrence treated with chemoradiation in 2020 and further surgery No evidence of recurrence  Diabetic foot ulcer (HCC) See pics Will get MRI to rule out abscess here Consider podiatry consultation      Advance Care Planning:   Code Status: Prior full  Consults: None  Family Communication: Girlfriend at  bedside  Severity of Illness:  The appropriate patient status for this patient is INPATIENT. Inpatient status is judged to be reasonable and necessary in order to provide the required intensity of service to ensure the patient's safety. The patient's presenting symptoms, physical exam findings, and initial radiographic and laboratory data in the context of their chronic comorbidities is felt to place them at high risk for further clinical deterioration. Furthermore, it is not anticipated that the patient will be medically stable for discharge from the hospital within 2 midnights of admission.   * I certify that at the point of admission it is my clinical judgment that the patient will require inpatient hospital care spanning beyond 2 midnights from the point of admission due to high intensity of service, high risk for further deterioration and high frequency of surveillance required.*  Author: Glenys GORMAN Birk, MD 08/12/2023 6:02 PM  For on call review [email or web address].

## 2023-08-12 NOTE — Assessment & Plan Note (Signed)
 On gabapentin.

## 2023-08-12 NOTE — Assessment & Plan Note (Addendum)
 Check MRI to rule out osteomyelitis Begin vancomycin  Normal ABIs in January 2025

## 2023-08-12 NOTE — Progress Notes (Signed)
 Pharmacy Antibiotic Note  Jason Jennings is a 58 y.o. male admitted on 08/12/2023 with cellulitis. Patient presenting with increased pain and swelling in left lower extremity. PMH significant for colon cancer, T2DM, polyneuropathy, CKD, HTN, BPH, HLD who has a history of osteomyelitis of his left foot. In ED, patient is afebrile with WBC 12.1. Pharmacy has been consulted for vancomycin  dosing.  Plan: Give vancomycin  load of 1750 mg IV x1 Will dose per vancomycin  levels given unstable renal function Monitor renal function, clinical status, culture data, and LOT  Height: 5' 5 (165.1 cm) Weight: 80.3 kg (177 lb 0.5 oz) IBW/kg (Calculated) : 61.5  Temp (24hrs), Avg:99.6 F (37.6 C), Min:99.6 F (37.6 C), Max:99.6 F (37.6 C)  Recent Labs  Lab 08/12/23 1534 08/12/23 1710  WBC 12.1*  --   CREATININE 3.25*  --   LATICACIDVEN 1.9 2.0*    Estimated Creatinine Clearance: 23.9 mL/min (A) (by C-G formula based on SCr of 3.25 mg/dL (H)).    No Known Allergies  Antimicrobials this admission: ceftriaxone  7/5 x1 vancomycin  7/5 >>   Dose adjustments this admission: N/A  Microbiology results: 7/5 BCx: pending  Thank you for involving pharmacy in this patient's care.   Damien Napoleon, PharmD Clinical Pharmacist 08/12/2023 7:07 PM

## 2023-08-12 NOTE — ED Notes (Signed)
Pt to MRI, NADN

## 2023-08-12 NOTE — Assessment & Plan Note (Addendum)
 Treated at [facility]  Status post neoadjuvant chemoradiation with low anterior resection in 2016. Recurrence treated with chemoradiation in 2020 and further surgery No evidence of recurrence

## 2023-08-13 DIAGNOSIS — L03119 Cellulitis of unspecified part of limb: Secondary | ICD-10-CM | POA: Diagnosis not present

## 2023-08-13 DIAGNOSIS — L02619 Cutaneous abscess of unspecified foot: Secondary | ICD-10-CM | POA: Diagnosis not present

## 2023-08-13 LAB — GLUCOSE, CAPILLARY
Glucose-Capillary: 299 mg/dL — ABNORMAL HIGH (ref 70–99)
Glucose-Capillary: 301 mg/dL — ABNORMAL HIGH (ref 70–99)
Glucose-Capillary: 252 mg/dL — ABNORMAL HIGH (ref 70–99)
Glucose-Capillary: 99 mg/dL (ref 70–99)

## 2023-08-13 LAB — RENAL FUNCTION PANEL
Albumin: 2.5 g/dL — ABNORMAL LOW (ref 3.5–5.0)
Anion gap: 8 (ref 5–15)
BUN: 42 mg/dL — ABNORMAL HIGH (ref 6–20)
CO2: 24 mmol/L (ref 22–32)
Calcium: 8.9 mg/dL (ref 8.9–10.3)
Chloride: 103 mmol/L (ref 98–111)
Creatinine, Ser: 2.73 mg/dL — ABNORMAL HIGH (ref 0.61–1.24)
GFR, Estimated: 26 mL/min — ABNORMAL LOW (ref >60)
Glucose, Bld: 304 mg/dL — ABNORMAL HIGH (ref 70–99)
Phosphorus: 3.8 mg/dL (ref 2.5–4.6)
Potassium: 3.9 mmol/L (ref 3.5–5.1)
Sodium: 135 mmol/L (ref 135–145)

## 2023-08-13 LAB — HEMOGLOBIN A1C
Hgb A1c MFr Bld: 11 % — ABNORMAL HIGH (ref 4.8–5.6)
Mean Plasma Glucose: 269 mg/dL

## 2023-08-13 LAB — URINALYSIS, W/ REFLEX TO CULTURE (INFECTION SUSPECTED)
Bilirubin Urine: NEGATIVE
Glucose, UA: >=500 mg/dL — AB
Ketones, ur: NEGATIVE mg/dL
Leukocytes,Ua: NEGATIVE
Nitrite: NEGATIVE
Protein, ur: >=300 mg/dL — AB
Specific Gravity, Urine: 1.017 (ref 1.005–1.030)
pH: 5 (ref 5.0–8.0)

## 2023-08-13 LAB — LACTIC ACID, PLASMA: Lactic Acid, Venous: 2 mmol/L (ref 0.5–1.9)

## 2023-08-13 MED ORDER — INSULIN ASPART PROT & ASPART (70-30 MIX) 100 UNIT/ML ~~LOC~~ SUSP
30.0000 [IU] | Freq: Two times a day (BID) | SUBCUTANEOUS | Status: DC
  Administered 2023-08-13 – 2023-08-20 (×15): 30 [IU] via SUBCUTANEOUS
  Filled 2023-08-13: qty 10

## 2023-08-13 MED ORDER — INSULIN GLARGINE-YFGN 100 UNIT/ML ~~LOC~~ SOLN
10.0000 [IU] | Freq: Two times a day (BID) | SUBCUTANEOUS | Status: DC
  Administered 2023-08-13 (×2): 10 [IU] via SUBCUTANEOUS
  Filled 2023-08-13 (×3): qty 0.1

## 2023-08-13 MED ORDER — LINEZOLID 600 MG/300ML IV SOLN
600.0000 mg | Freq: Two times a day (BID) | INTRAVENOUS | Status: DC
  Administered 2023-08-13 – 2023-08-15 (×4): 600 mg via INTRAVENOUS
  Filled 2023-08-13 (×4): qty 300

## 2023-08-13 NOTE — Plan of Care (Signed)
 Pt's first night on the unit.  Will assess and update plan of care as needed.   Problem: Education: Goal: Ability to describe self-care measures that may prevent or decrease complications (Diabetes Survival Skills Education) will improve Outcome: Progressing Goal: Individualized Educational Video(s) Outcome: Progressing   Problem: Coping: Goal: Ability to adjust to condition or change in health will improve Outcome: Progressing   Problem: Fluid Volume: Goal: Ability to maintain a balanced intake and output will improve Outcome: Progressing   Problem: Health Behavior/Discharge Planning: Goal: Ability to identify and utilize available resources and services will improve Outcome: Progressing Goal: Ability to manage health-related needs will improve Outcome: Progressing   Problem: Metabolic: Goal: Ability to maintain appropriate glucose levels will improve Outcome: Progressing   Problem: Nutritional: Goal: Maintenance of adequate nutrition will improve Outcome: Progressing Goal: Progress toward achieving an optimal weight will improve Outcome: Progressing   Problem: Skin Integrity: Goal: Risk for impaired skin integrity will decrease Outcome: Progressing   Problem: Tissue Perfusion: Goal: Adequacy of tissue perfusion will improve Outcome: Progressing   Problem: Education: Goal: Knowledge of General Education information will improve Description: Including pain rating scale, medication(s)/side effects and non-pharmacologic comfort measures Outcome: Progressing   Problem: Health Behavior/Discharge Planning: Goal: Ability to manage health-related needs will improve Outcome: Progressing   Problem: Clinical Measurements: Goal: Ability to maintain clinical measurements within normal limits will improve Outcome: Progressing Goal: Will remain free from infection Outcome: Progressing Goal: Diagnostic test results will improve Outcome: Progressing Goal: Respiratory  complications will improve Outcome: Progressing Goal: Cardiovascular complication will be avoided Outcome: Progressing   Problem: Activity: Goal: Risk for activity intolerance will decrease Outcome: Progressing   Problem: Nutrition: Goal: Adequate nutrition will be maintained Outcome: Progressing   Problem: Coping: Goal: Level of anxiety will decrease Outcome: Progressing   Problem: Elimination: Goal: Will not experience complications related to bowel motility Outcome: Progressing Goal: Will not experience complications related to urinary retention Outcome: Progressing   Problem: Pain Managment: Goal: General experience of comfort will improve and/or be controlled Outcome: Progressing   Problem: Safety: Goal: Ability to remain free from injury will improve Outcome: Progressing   Problem: Skin Integrity: Goal: Risk for impaired skin integrity will decrease Outcome: Progressing   Problem: Clinical Measurements: Goal: Ability to avoid or minimize complications of infection will improve Outcome: Progressing   Problem: Skin Integrity: Goal: Skin integrity will improve Outcome: Progressing

## 2023-08-13 NOTE — Progress Notes (Signed)
 Progress Note   Patient: Jason Jennings FMW:[id number] DOB: 1965-08-14 DOA: 08/12/2023     1 DOS: the patient was seen and examined on 08/13/2023   Brief hospital course: Jason Jennings is a 58 y.o. 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  He was in wound care for approximately 3 months following debridement and finally got closure in March 2025.  He reports a 1 day history of increasing pain and swelling and erythema in his lower leg.  He reports significant pain at the ball of his foot under the callus that he describes as feeling like an abscess under there.  He began having nausea and vomiting today as well.  In the ED he was noted to have significant cellulitis with negative x-ray and negative workup for DVT.  The swelling is his lower extremity including the ankle and outer part of his lower leg.  He had an elevated white count at 12.1 and elevated lactic acid at 2.  He was given IV fluid resuscitation and antibiotics and we were asked to admit     Assessment and Plan: Cellulitis of the left leg MRI did not show evidence of osteomyelitis Vancomycin  have been switched to Zyvox  given renal function as recommended by pharmacist Normal ABIs in January 2025   Acute renal failure superimposed on stage 3b chronic kidney disease (HCC) Baseline creatinine is around 2.3 Renal function improving Continue holding ARB Avoid nephrotoxic agents Continue IV fluid resuscitation   Type 2 diabetes mellitus with hyperlipidemia (HCC) Patient is currently on insulin  7525 to 30 units twice daily Continue sliding scale   HTN (hypertension) Holding losartan  Calcium  channel blocker Lopressor  as needed   Neuropathy, diabetic (HCC) Continue gabapentin    H/O colon cancer, stage II Treated at [facility]  Status post neoadjuvant chemoradiation with low anterior resection in 2016. Recurrence  treated with chemoradiation in 2020 and further surgery No evidence of recurrence   Diabetic foot ulcer (HCC) MRI of the foot reviewed that did not show any abscess or osteomyelitis Podiatry consulted for possible debridement   Advance Care Planning:   Code Status: Prior full   Consults: None   Family Communication: Girlfriend at bedside    Subjective:  Patient seen and examined at bedside this morning He is worried of possible foreign body in the left foot I discussed the findings of MRI with him He denies nausea vomiting abdominal pain chest pain or cough  Physical Exam: General appearance - alert, well appearing, and in no distress Neck - supple, no significant adenopathy Chest -normal effort Heart -tachycardic rate Abdomen - soft, nontender, nondistended, no masses or organomegaly Extremities -erythema and calor noted on the lateral side of the lower extremity from ankle to almost the knee see photos There is a callus on the left foot underneath the fourth toe with some dark color changes underneath it  Vitals:   08/13/23 0049 08/13/23 0412 08/13/23 0735 08/13/23 1531  BP: 137/67 128/66 (!) 153/61 (!) 143/56  Pulse: (!) 104 81 97 91  Resp: 18 17 18 17   Temp: 98.9 F (37.2 C) 98.1 F (36.7 C) 99.1 F (37.3 C) 99.2 F (37.3 C)  TempSrc: Oral Oral    SpO2: 94% 94% 94% 94%  Weight:      Height:        Data Reviewed:  Patient seen and examined at bedside    Latest  Ref Rng & Units 08/12/2023    3:34 PM 03/26/2023   11:39 PM 02/02/2023    4:14 AM  CBC  WBC 4.0 - 10.5 K/uL 12.1  7.4  6.8   Hemoglobin 13.0 - 17.0 g/dL 87.9  89.7  9.4   Hematocrit 39.0 - 52.0 % 34.3  30.5  28.3   Platelets 150 - 400 K/uL 172  187  201        Latest Ref Rng & Units 08/13/2023    8:36 AM 08/12/2023    3:34 PM 03/26/2023   11:39 PM  BMP  Glucose 70 - 99 mg/dL 695  598  876   BUN 6 - 20 mg/dL 42  51  39   Creatinine 0.61 - 1.24 mg/dL 7.26  6.74  7.85   Sodium 135 - 145 mmol/L 135   132  141   Potassium 3.5 - 5.1 mmol/L 3.9  3.8  3.4   Chloride 98 - 111 mmol/L 103  94  104   CO2 22 - 32 mmol/L 24  25  24    Calcium  8.9 - 10.3 mg/dL 8.9  88.8  9.2   MRI reviewed    Time spent: 55 minutes  Author: Drue ONEIDA Potter, MD 08/13/2023 4:17 PM  For on call review [email or web address].

## 2023-08-14 DIAGNOSIS — L02619 Cutaneous abscess of unspecified foot: Secondary | ICD-10-CM | POA: Diagnosis not present

## 2023-08-14 DIAGNOSIS — L97522 Non-pressure chronic ulcer of other part of left foot with fat layer exposed: Secondary | ICD-10-CM

## 2023-08-14 DIAGNOSIS — L03119 Cellulitis of unspecified part of limb: Secondary | ICD-10-CM | POA: Diagnosis not present

## 2023-08-14 LAB — BASIC METABOLIC PANEL WITH GFR
Anion gap: 7 (ref 5–15)
BUN: 39 mg/dL — ABNORMAL HIGH (ref 6–20)
CO2: 26 mmol/L (ref 22–32)
Calcium: 8.6 mg/dL — ABNORMAL LOW (ref 8.9–10.3)
Chloride: 105 mmol/L (ref 98–111)
Creatinine, Ser: 2.58 mg/dL — ABNORMAL HIGH (ref 0.61–1.24)
GFR, Estimated: 28 mL/min — ABNORMAL LOW (ref >60)
Glucose, Bld: 69 mg/dL — ABNORMAL LOW (ref 70–99)
Potassium: 3.3 mmol/L — ABNORMAL LOW (ref 3.5–5.1)
Sodium: 138 mmol/L (ref 135–145)

## 2023-08-14 LAB — CBC WITH DIFFERENTIAL/PLATELET
Abs Immature Granulocytes: 0.04 K/uL (ref 0.00–0.07)
Basophils Absolute: 0 K/uL (ref 0.0–0.1)
Basophils Relative: 0 %
Eosinophils Absolute: 0 K/uL (ref 0.0–0.5)
Eosinophils Relative: 0 %
HCT: 30.7 % — ABNORMAL LOW (ref 39.0–52.0)
Hemoglobin: 10.4 g/dL — ABNORMAL LOW (ref 13.0–17.0)
Immature Granulocytes: 1 %
Lymphocytes Relative: 6 %
Lymphs Abs: 0.5 K/uL — ABNORMAL LOW (ref 0.7–4.0)
MCH: 29.5 pg (ref 26.0–34.0)
MCHC: 33.9 g/dL (ref 30.0–36.0)
MCV: 87 fL (ref 80.0–100.0)
Monocytes Absolute: 0.6 K/uL (ref 0.1–1.0)
Monocytes Relative: 7 %
Neutro Abs: 7.2 K/uL (ref 1.7–7.7)
Neutrophils Relative %: 86 %
Platelets: 144 K/uL — ABNORMAL LOW (ref 150–400)
RBC: 3.53 MIL/uL — ABNORMAL LOW (ref 4.22–5.81)
RDW: 13 % (ref 11.5–15.5)
WBC: 8.4 K/uL (ref 4.0–10.5)
nRBC: 0 % (ref 0.0–0.2)

## 2023-08-14 LAB — GLUCOSE, CAPILLARY
Glucose-Capillary: 125 mg/dL — ABNORMAL HIGH (ref 70–99)
Glucose-Capillary: 220 mg/dL — ABNORMAL HIGH (ref 70–99)
Glucose-Capillary: 264 mg/dL — ABNORMAL HIGH (ref 70–99)
Glucose-Capillary: 169 mg/dL — ABNORMAL HIGH (ref 70–99)

## 2023-08-14 LAB — HIV ANTIBODY (ROUTINE TESTING W REFLEX): HIV Screen 4th Generation wRfx: NONREACTIVE

## 2023-08-14 MED ORDER — ZINC SULFATE 220 (50 ZN) MG PO CAPS
220.0000 mg | ORAL_CAPSULE | Freq: Every day | ORAL | Status: DC
  Administered 2023-08-14 – 2023-08-20 (×7): 220 mg via ORAL
  Filled 2023-08-14 (×7): qty 1

## 2023-08-14 MED ORDER — ENSURE MAX PROTEIN PO LIQD
11.0000 [oz_av] | Freq: Every day | ORAL | Status: DC
  Administered 2023-08-14 – 2023-08-19 (×5): 11 [oz_av] via ORAL

## 2023-08-14 MED ORDER — GENTAMICIN SULFATE 0.1 % EX OINT
TOPICAL_OINTMENT | Freq: Every day | CUTANEOUS | Status: DC
  Filled 2023-08-14 (×2): qty 15

## 2023-08-14 MED ORDER — JUVEN PO PACK
1.0000 | PACK | Freq: Two times a day (BID) | ORAL | Status: DC
  Administered 2023-08-14 – 2023-08-20 (×13): 1 via ORAL

## 2023-08-14 MED ORDER — VITAMIN C 500 MG PO TABS
500.0000 mg | ORAL_TABLET | Freq: Two times a day (BID) | ORAL | Status: DC
  Administered 2023-08-14 – 2023-08-20 (×13): 500 mg via ORAL
  Filled 2023-08-14 (×13): qty 1

## 2023-08-14 MED ORDER — ADULT MULTIVITAMIN W/MINERALS CH
1.0000 | ORAL_TABLET | Freq: Every day | ORAL | Status: DC
  Administered 2023-08-14 – 2023-08-20 (×7): 1 via ORAL
  Filled 2023-08-14 (×7): qty 1

## 2023-08-14 MED ORDER — SODIUM CHLORIDE 0.9 % IV SOLN
3.0000 g | Freq: Four times a day (QID) | INTRAVENOUS | Status: DC
  Administered 2023-08-14 – 2023-08-16 (×10): 3 g via INTRAVENOUS
  Filled 2023-08-14 (×11): qty 8

## 2023-08-14 MED ORDER — INSULIN GLARGINE-YFGN 100 UNIT/ML ~~LOC~~ SOLN
10.0000 [IU] | Freq: Every day | SUBCUTANEOUS | Status: DC
  Administered 2023-08-14 – 2023-08-20 (×7): 10 [IU] via SUBCUTANEOUS
  Filled 2023-08-14 (×7): qty 0.1

## 2023-08-14 NOTE — Inpatient Diabetes Management (Signed)
 Inpatient Diabetes Program Recommendations  AACE/ADA: New Consensus Statement on Inpatient Glycemic Control (2015)  Target Ranges:  Prepandial:   less than 140 mg/dL      Peak postprandial:   less than 180 mg/dL (1-2 hours)      Critically ill patients:  140 - 180 mg/dL   Lab Results  Component Value Date   GLUCAP 125 (H) 08/14/2023   HGBA1C 11.0 (H) 08/12/2023    Review of Glycemic Control  Latest Reference Range & Units 08/12/23 20:46 08/13/23 07:33 08/13/23 11:59 08/13/23 17:14 08/13/23 21:10 08/14/23 09:06  Glucose-Capillary 70 - 99 mg/dL 665 (H) 700 (H) 698 (H) 252 (H) 99 125 (H)   Diabetes history: DM 2 Outpatient Diabetes medications:  Humalog  75/25 mix 30 units bid Lantus  12 units q HS Steroid dose pack- Current orders for Inpatient glycemic control:  Novolog  0-9 units tid with meals and HS Novolog  70/30 mix 30 units bid Semglee  10 units daily  Inpatient Diabetes Program Recommendations:    Agree with reduction of Semglee .  A1C indicates poor control of DM prior to admit.  Spoke with patient and he states that he has recently been on steroids.  He thought his blood sugars were doing better.  He is interested in CGM for monitoring but states his phone is not compatible.  Will request benefits check to see if CGM reader/sensor are options for him.  He is also in process of getting new PCP.  Discussed importance of glycemic control and keeping blood sugars normal especially with his foot infection.  He verbalized understanding.   Thanks,  Randall Bullocks, RN, BC-ADM Inpatient Diabetes Coordinator Pager [phone]

## 2023-08-14 NOTE — Consult Note (Signed)
 PODIATRY CONSULTATION  NAME Jason Jennings MRN [id number] DOB 1965/02/15 DOA 08/12/2023   Reason for consult:  Chief Complaint  Patient presents with   Leg Swelling    Attending/Consulting physician: MYRTIS Potter MD  History of present illness: Jason Jennings is a 58 y.o. 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  He was in wound care for approximately 3 months following debridement and finally got closure in March 2025.  He reports a 1 day history of increasing pain and swelling and erythema in his lower leg.  He reports significant pain at the ball of his foot under the callus that he describes as feeling like an abscess under there.  He began having nausea and vomiting today as well.  In the ED he was noted to have significant cellulitis with negative x-ray and negative workup for DVT.  The swelling is his lower extremity including the ankle and outer part of his lower leg.  He had an elevated white count at 12.1 and elevated lactic acid at 2.  He was given IV fluid resuscitation and antibiotics and we were asked to admit  Patient known to me from prior admission where he required incision and drainage of abscess of the left foot.  Has been going to wound care clinic and had the wound completely healed in March or 2025.  Recently noticed that he had increased pain in the ball of his left foot under a callus and thought there might be some fluid there.  Does report a history of neuropathy.  Has postop shoe at home.  Has not been on antibiotics until this admission.   Past Medical History:  Diagnosis Date   Adenocarcinoma of rectum (HCC)    Adenocarcinoma of rectum (HCC)    Cancer (HCC)    Chronic kidney disease, stage 3b (HCC)    Diabetes mellitus due to underlying condition with diabetic autonomic neuropathy, unspecified whether long term insulin  use (HCC)    Diabetes  mellitus without complication (HCC)    Diabetic polyneuropathy (HCC)    Diabetic retinopathy (HCC)    Neuropathy    S/P colostomy (HCC)    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)    Ulcer of left foot with fat layer exposed (HCC)        Latest Ref Rng & Units 08/14/2023    5:22 AM 08/12/2023    3:34 PM 03/26/2023   11:39 PM  CBC  WBC 4.0 - 10.5 K/uL 8.4  12.1  7.4   Hemoglobin 13.0 - 17.0 g/dL 89.5  87.9  89.7   Hematocrit 39.0 - 52.0 % 30.7  34.3  30.5   Platelets 150 - 400 K/uL 144  172  187        Latest Ref Rng & Units 08/14/2023    5:22 AM 08/13/2023    8:36 AM 08/12/2023    3:34 PM  BMP  Glucose 70 - 99 mg/dL 69  695  598   BUN 6 - 20 mg/dL 39  42  51   Creatinine 0.61 - 1.24 mg/dL 7.41  7.26  6.74   Sodium 135 - 145 mmol/L 138  135  132   Potassium 3.5 - 5.1 mmol/L 3.3  3.9  3.8   Chloride 98 - 111 mmol/L 105  103  94   CO2 22 - 32 mmol/L 26  24  25   Calcium  8.9 - 10.3 mg/dL 8.6  8.9  88.8       Physical Exam: Lower Extremity Exam Left foot with palpable DP and PT pulses  Sensation diminished to the left forefoot plantarly  Attention directed under the fourth metatarsal head area there is noted to be a hyperkeratotic lesion with some dark discoloration underlying.  Upon debridement of the overlying hyperkeratotic tissue there is an ulceration that probes to subcutaneous fat tissue layer with some malodor and phlegmonous collection underlying the skin.  This was cultured.  See pre and postdebridement pictures below      ASSESSMENT/PLAN OF CARE 58 y.o. male with PMHx significant for  colon cancer, T2DM, polyneuropathy, CKD, HTN, BPH, HLD who has a history of osteomyelitis of his left foot in the past  with left plantar forefoot ulceration subfourth metatarsal head with superficial abscess status post bedside irrigation and debridement  WBC 8.4 improved from 12.1  MRI left foot without contrast: Ulceration plantar aspect fourth metatarsal head  without obvious large abscess and reactive marrow edema in the fourth proximal phalanx without evidence of osteomyelitis.   -Discussed with patient that he does have evidence of a hyperkeratotic lesion overlying the ulceration with possible small abscess underlying.  I recommended a bedside incision and drainage/debridement procedure and he agreed to proceed.  After prep with Betadine solution I proceeded with excisional debridement of the hyperkeratotic tissue overlying the ulceration.  Upon removal of this tissue there was a small amount of phlegmonous purulence that was expressed.  This was cultured which was sent for aerobic anaerobic culture.  The ulcer was further excisionally debrided with 10 blade to the level of subcutaneous fat tissue where healthy bleeding tissue was encountered.  There is no deep probing aspect or sinus tract present.  The wound was cleansed thoroughly with Betadine solution.  Xeroform and dry gauze dressing was then applied. - Okay to transition to p.o. antibiotics when culture data is available and recommend 14 days of culture directed oral antibiotics - Anticoagulation: Okay to continue per primary - Wound care: Recommend daily dressing change to the left foot with gentamicin  ointment and cover wound with Hydrofera Blue or Xeroform 4 x 4 gauze Kerlix and Ace roll - WB status: Weightbearing as tolerated in postop shoe.  Patient will have his postop shoe brought to him from home -Patient will follow-up with wound care center recommend referral to [facility] upon discharge.  He had trouble following up with our office due to insurance issues. - Will sign off please reach out with questions or concerns   Thank you for the consult.  Please contact me directly with any questions or concerns.           Marolyn JULIANNA Honour, DPM [facility]    [address] [place], [place] [address]                 Office [phone]  Fax [phone]

## 2023-08-14 NOTE — Plan of Care (Signed)
   Problem: Education: Goal: Ability to describe self-care measures that may prevent or decrease complications (Diabetes Survival Skills Education) will improve Outcome: Progressing   Problem: Coping: Goal: Ability to adjust to condition or change in health will improve Outcome: Progressing   Problem: Nutritional: Goal: Maintenance of adequate nutrition will improve Outcome: Progressing   Problem: Education: Goal: Knowledge of General Education information will improve Description: Including pain rating scale, medication(s)/side effects and non-pharmacologic comfort measures Outcome: Progressing

## 2023-08-14 NOTE — Progress Notes (Signed)
 Progress Note   Patient: Jason Jennings FMW:[id number] DOB: 1965/03/07 DOA: 08/12/2023     2 DOS: the patient was seen and examined on 08/14/2023     Brief hospital course: Jason Jennings is a 58 y.o. male with medical history significant of  colon cancer, T2DM, polyneuropathy, CKD, HTN, BPH, HLD who has a history of osteomyelitis of his left Jennings in the past.  He was in wound care for approximately 3 months following debridement and finally got closure in March 2025.  He reports a 1 day history of increasing pain and swelling and erythema in his lower leg.  He reports significant pain at the ball of his Jennings under the callus that he describes as feeling like an abscess under there.  He began having nausea and vomiting today as well.  In the ED he was noted to have significant cellulitis with negative x-ray and negative workup for DVT.  The swelling is his lower extremity including the ankle and outer part of his lower leg.  He had an elevated white count at 12.1 and elevated lactic acid at 2.  He was given IV fluid resuscitation and antibiotics and we were asked to admit        Assessment and Plan: Cellulitis of the left leg Superficial abscess of plantar surface of left Jennings MRI did not show evidence of osteomyelitis Vancomycin  have been switched to Zyvox  given renal function as recommended by pharmacist Patient underwent I&D on 08/14/2023 by podiatrist that showed phlegmonous purulence Unasyn  subsequently added for  GNR/anaerobic coverage Continue to follow-up on culture results Normal ABIs in January 2025   Acute renal failure superimposed on stage 3b chronic kidney disease (HCC) Baseline creatinine is around 2.3 Renal function improving Continue holding ARB Avoid nephrotoxic agents Continue IV fluid resuscitation    Hypokalemia Continue potassium repletion and monitoring  Type 2 diabetes mellitus with hyperlipidemia (HCC) Continue sliding scale   HTN (hypertension) Holding  losartan  Calcium  channel blocker Lopressor  as needed   Neuropathy, diabetic (HCC) Continue gabapentin    H/O colon cancer, stage II Treated at [facility]  Status post neoadjuvant chemoradiation with low anterior resection in 2016. Recurrence treated with chemoradiation in 2020 and further surgery No evidence of recurrence   Diabetic Jennings ulcer (HCC) MRI of the Jennings reviewed that did not show any abscess or osteomyelitis Podiatry consulted for possible debridement    Advance Care Planning:   Code Status: Prior full   Consults: None   Family Communication: Girlfriend at bedside     Subjective:  Patient seen and examined at bedside this morning Erythema and swelling improving He denies nausea vomiting abdominal pain chest pain or cough   Physical Exam: General appearance - alert, well appearing, and in no distress Neck - supple, no significant adenopathy Chest -normal effort Heart -tachycardic rate Abdomen - soft, nontender, nondistended, no masses or organomegaly Extremities -erythema and calor noted on the lateral side of the lower extremity from ankle to almost the knee see photos There is a callus on the left Jennings underneath the fourth toe with some dark color changes underneath it    Data Reviewed:   Patient seen and examined at bedside  Vitals:   08/13/23 1929 08/14/23 0326 08/14/23 0856 08/14/23 1509  BP: (!) 142/69 (!) 162/76 (!) 126/59 137/71  Pulse: 80 77 74 84  Resp: 18 18 18 16   Temp: (!) 100.7 F (38.2 C) 98 F (36.7 C) 98.7 F (37.1 C) 98.3 F (36.8 C)  TempSrc:  SpO2: 95% 96% 98% 100%  Weight:      Height:          Latest Ref Rng & Units 08/14/2023    5:22 AM 08/12/2023    3:34 PM 03/26/2023   11:39 PM  CBC  WBC 4.0 - 10.5 K/uL 8.4  12.1  7.4   Hemoglobin 13.0 - 17.0 g/dL 89.5  87.9  89.7   Hematocrit 39.0 - 52.0 % 30.7  34.3  30.5   Platelets 150 - 400 K/uL 144  172  187        Latest Ref Rng & Units 08/14/2023    5:22 AM 08/13/2023    8:36 AM  08/12/2023    3:34 PM  BMP  Glucose 70 - 99 mg/dL 69  695  598   BUN 6 - 20 mg/dL 39  42  51   Creatinine 0.61 - 1.24 mg/dL 7.41  7.26  6.74   Sodium 135 - 145 mmol/L 138  135  132   Potassium 3.5 - 5.1 mmol/L 3.3  3.9  3.8   Chloride 98 - 111 mmol/L 105  103  94   CO2 22 - 32 mmol/L 26  24  25    Calcium  8.9 - 10.3 mg/dL 8.6  8.9  88.8      Author: Drue ONEIDA Potter, MD 08/14/2023 3:28 PM  For on call review [email or web address].

## 2023-08-15 ENCOUNTER — Telehealth (HOSPITAL_COMMUNITY): Payer: Self-pay | Admitting: Pharmacy Technician

## 2023-08-15 ENCOUNTER — Other Ambulatory Visit: Payer: Self-pay

## 2023-08-15 ENCOUNTER — Other Ambulatory Visit (HOSPITAL_COMMUNITY): Payer: Self-pay

## 2023-08-15 DIAGNOSIS — L03119 Cellulitis of unspecified part of limb: Secondary | ICD-10-CM | POA: Diagnosis not present

## 2023-08-15 DIAGNOSIS — L02619 Cutaneous abscess of unspecified foot: Secondary | ICD-10-CM | POA: Diagnosis not present

## 2023-08-15 LAB — CBC WITH DIFFERENTIAL/PLATELET
Abs Immature Granulocytes: 0.03 K/uL (ref 0.00–0.07)
Basophils Absolute: 0 K/uL (ref 0.0–0.1)
Basophils Relative: 0 %
Eosinophils Absolute: 0.1 K/uL (ref 0.0–0.5)
Eosinophils Relative: 1 %
HCT: 30.9 % — ABNORMAL LOW (ref 39.0–52.0)
Hemoglobin: 10.4 g/dL — ABNORMAL LOW (ref 13.0–17.0)
Immature Granulocytes: 0 %
Lymphocytes Relative: 9 %
Lymphs Abs: 0.7 K/uL (ref 0.7–4.0)
MCH: 29.1 pg (ref 26.0–34.0)
MCHC: 33.7 g/dL (ref 30.0–36.0)
MCV: 86.6 fL (ref 80.0–100.0)
Monocytes Absolute: 0.6 K/uL (ref 0.1–1.0)
Monocytes Relative: 8 %
Neutro Abs: 5.9 K/uL (ref 1.7–7.7)
Neutrophils Relative %: 82 %
Platelets: 146 K/uL — ABNORMAL LOW (ref 150–400)
RBC: 3.57 MIL/uL — ABNORMAL LOW (ref 4.22–5.81)
RDW: 12.8 % (ref 11.5–15.5)
WBC: 7.4 K/uL (ref 4.0–10.5)
nRBC: 0 % (ref 0.0–0.2)

## 2023-08-15 LAB — GLUCOSE, CAPILLARY
Glucose-Capillary: 172 mg/dL — ABNORMAL HIGH (ref 70–99)
Glucose-Capillary: 129 mg/dL — ABNORMAL HIGH (ref 70–99)
Glucose-Capillary: 191 mg/dL — ABNORMAL HIGH (ref 70–99)
Glucose-Capillary: 150 mg/dL — ABNORMAL HIGH (ref 70–99)

## 2023-08-15 LAB — BASIC METABOLIC PANEL WITH GFR
Anion gap: 10 (ref 5–15)
BUN: 40 mg/dL — ABNORMAL HIGH (ref 6–20)
CO2: 26 mmol/L (ref 22–32)
Calcium: 8.4 mg/dL — ABNORMAL LOW (ref 8.9–10.3)
Chloride: 100 mmol/L (ref 98–111)
Creatinine, Ser: 2.27 mg/dL — ABNORMAL HIGH (ref 0.61–1.24)
GFR, Estimated: 32 mL/min — ABNORMAL LOW (ref >60)
Glucose, Bld: 168 mg/dL — ABNORMAL HIGH (ref 70–99)
Potassium: 3.6 mmol/L (ref 3.5–5.1)
Sodium: 136 mmol/L (ref 135–145)

## 2023-08-15 MED ORDER — FREESTYLE LIBRE 2 READER DEVI: 1.0000 | Freq: Two times a day (BID) | 1 refills | Status: DC

## 2023-08-15 MED ORDER — LINEZOLID 600 MG PO TABS
600.0000 mg | ORAL_TABLET | Freq: Two times a day (BID) | ORAL | Status: DC
  Administered 2023-08-15 – 2023-08-17 (×4): 600 mg via ORAL
  Filled 2023-08-15 (×4): qty 1

## 2023-08-15 MED ORDER — FREESTYLE LIBRE 3 PLUS SENSOR MISC
1 refills | Status: DC
  Filled 2023-08-15 (×3): qty 1, 15d supply, fill #0

## 2023-08-15 MED ORDER — FREESTYLE LIBRE 3 READER DEVI
1.0000 | Freq: Two times a day (BID) | 1 refills | Status: DC
Start: 2023-08-15 — End: 2023-11-12
  Filled 2023-08-15: qty 1, 30d supply, fill #0

## 2023-08-15 MED ORDER — FREESTYLE LIBRE 3 PLUS SENSOR MISC: 1 refills | Status: DC

## 2023-08-15 NOTE — Plan of Care (Signed)

## 2023-08-15 NOTE — Care Management Important Message (Signed)
 Important Message  Patient Details  Name: Jason Jennings MRN: [id number] Date of Birth: [birth year]   Important Message Given:  Yes - Medicare IM     Tamana Hatfield W, CMA 08/15/2023, 3:37 PM

## 2023-08-15 NOTE — Telephone Encounter (Signed)
 Patient Product/process development scientist completed.    The patient is insured through [facility]. Patient has Medicare and is not eligible for a copay card, but may be able to apply for patient assistance or Medicare RX Payment Plan (Patient Must reach out to their plan, if eligible for payment plan), if available.    Ran test claim for Freestyle Libre 3 Plus Sensor and the current 30 day co-pay is $0.00.  Ran test claim for [employer] 3 Reader and the current 30 day co-pay is $0.00.  This test claim was processed through [facility]- copay amounts may vary at other pharmacies due to pharmacy/plan contracts, or as the patient moves through the different stages of their insurance plan.     Reyes Sharps, CPHT Pharmacy Technician III Certified Patient Advocate [facility] Patient Advocate Team Direct Number: [phone]  Fax: [phone]

## 2023-08-15 NOTE — Inpatient Diabetes Management (Addendum)
 Inpatient Diabetes Program Recommendations  AACE/ADA: New Consensus Statement on Inpatient Glycemic Control (2015)  Target Ranges:  Prepandial:   less than 140 mg/dL      Peak postprandial:   less than 180 mg/dL (1-2 hours)      Critically ill patients:  140 - 180 mg/dL   Lab Results  Component Value Date   GLUCAP 172 (H) 08/15/2023   HGBA1C 11.0 (H) 08/12/2023    Review of Glycemic Control  Latest Reference Range & Units 08/14/23 09:06 08/14/23 11:48 08/14/23 16:54 08/14/23 21:20 08/15/23 07:30  Glucose-Capillary 70 - 99 mg/dL 874 (H) 779 (H) 735 (H) 169 (H) 172 (H)   Diabetes history: DM 2 Outpatient Diabetes medications:  Humalog  75/25 30 units bid Lantus  14 units q HS Current orders for Inpatient glycemic control:  Novolog  0-9 units tid with meals Novolog  70/30 mix 30 units bid Semglee  10 units daily  Inpatient Diabetes Program Recommendations:    CBG's improved. Called and explained to patient that Freestyle Libre 3 is a zero dollar co-pay and that reader is also covered. Discussed that we would try to get this to him (from outpatient pharmacy prior to discharge).   Addendum 1500:  Outpatient pharmacy delivered [facility] sensor and FSL reader. MD ordered application of Freestyle CGM for patient. Education done regarding application and changing CGM sensor (alternate every 15 days on back of arms), 1 hour warm-up, use of glucometer when alert displays, how to scan CGM for glucose reading and information for PCP. Patient has also been given educational packet regarding use CGM sensor including the 1-800 toll free number for any questions, problems or needs related to the [facility] sensors or reader.    Sensor applied by patient to  Left Arm at 1455.  Explained that glucose readings will not be available until 1 hour after application. Reviewed use of CGM including how to scan, changing Sensor, Vitamin C  warning, arrows with glucose readings, and Freestyle app.  Also explained that  RN will continue to check blood sugars with fingerstick's while in the hospital.  Patient very appreciative.   Thanks,  Randall Bullocks, RN, BC-ADM Inpatient Diabetes Coordinator Pager [phone]  (8a-5p)

## 2023-08-15 NOTE — Progress Notes (Addendum)
 Progress Note   Patient: Jason Jennings FMW:[id number] DOB: 01/04/1966 DOA: 08/12/2023     3 DOS: the patient was seen and examined on 08/15/2023    Brief hospital course: Jason Jennings is a 58 y.o. 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  He was in wound care for approximately 3 months following debridement and finally got closure in March 2025.  He reports a 1 day history of increasing pain and swelling and erythema in his lower leg.  He reports significant pain at the ball of his foot under the callus that he describes as feeling like an abscess under there.  He began having nausea and vomiting today as well.  In the ED he was noted to have significant cellulitis with negative x-ray and negative workup for DVT.  The swelling is his lower extremity including the ankle and outer part of his lower leg.  He had an elevated white count at 12.1 and elevated lactic acid at 2.  He was given IV fluid resuscitation and antibiotics and we were asked to admit        Assessment and Plan: Sepsis secondary to cellulitis of the left leg-present on admission Superficial abscess of plantar surface of left foot Patient met sepsis criteria with WBC 12.1, pulse 110 in the setting of cellulitis. MRI did not show evidence of osteomyelitis Vancomycin  have been switched to Zyvox  given renal function as recommended by pharmacist Patient underwent I&D on 08/14/2023 by podiatrist that showed phlegmonous purulence Unasyn  subsequently added for  GNR/anaerobic coverage Continue to follow-up on culture results to guide outpatient antibiotic therapy Normal ABIs in January 2025   Acute renal failure superimposed on stage 3b chronic kidney disease (HCC) Baseline creatinine is around 2.3 Renal function improving Continue holding ARB Avoid nephrotoxic agents Renal function currently at baseline      Hypokalemia-improved Continue potassium repletion and monitoring   Type 2 diabetes mellitus with hyperlipidemia (HCC) Continue sliding scale   HTN (hypertension) Continue holding losartan  Calcium  channel blocker Lopressor  as needed   Neuropathy, diabetic (HCC) Continue gabapentin    H/O colon cancer, stage II Treated at [facility]  Status post neoadjuvant chemoradiation with low anterior resection in 2016. Recurrence treated with chemoradiation in 2020 and further surgery No evidence of recurrence   Diabetic foot ulcer (HCC) MRI of the foot reviewed that did not show any abscess or osteomyelitis Podiatry took patient for debridement on 08/14/2023 Weightbearing as tolerated in postop shoe as recommended by podiatry. PT OT consulted    Advance Care Planning:   Code Status: full   Consults: None   Family Communication: Jason Jennings at bedside   Disposition: Pending clinical course and PT OT eval, hopefully home  Subjective:  Patient seen and examined at bedside this morning Erythema and swelling continues to improve Culture results pending Patient denies nausea vomiting abdominal pain or worsening leg pain   Physical Exam: General appearance - alert, well appearing, and in no distress Neck - supple, no significant adenopathy Chest -normal effort Heart -tachycardic rate Abdomen - soft, nontender, nondistended, no masses or organomegaly Extremities -dressing noted to the left lower extremity appears clean and dry     Data Reviewed:      Latest Ref Rng & Units 08/15/2023    5:32 AM 08/14/2023    5:22 AM 08/12/2023    3:34 PM  CBC  WBC 4.0 - 10.5 K/uL  7.4  8.4  12.1   Hemoglobin 13.0 - 17.0 g/dL 89.5  89.5  87.9   Hematocrit 39.0 - 52.0 % 30.9  30.7  34.3   Platelets 150 - 400 K/uL 146  144  172        Latest Ref Rng & Units 08/15/2023    5:32 AM 08/14/2023    5:22 AM 08/13/2023    8:36 AM  BMP  Glucose 70 - 99 mg/dL 831  69  695   BUN 6 - 20 mg/dL 40  39  42   Creatinine  0.61 - 1.24 mg/dL 7.72  7.41  7.26   Sodium 135 - 145 mmol/L 136  138  135   Potassium 3.5 - 5.1 mmol/L 3.6  3.3  3.9   Chloride 98 - 111 mmol/L 100  105  103   CO2 22 - 32 mmol/L 26  26  24    Calcium  8.9 - 10.3 mg/dL 8.4  8.6  8.9      Vitals:   08/14/23 2101 08/15/23 0527 08/15/23 0732 08/15/23 1606  BP: (!) 149/78 (!) 149/78 (!) 156/73 (!) 153/75  Pulse: 77 72 74 75  Resp: 18 18 17 17   Temp: 99.5 F (37.5 C) 98.8 F (37.1 C) 98.3 F (36.8 C) 98.9 F (37.2 C)  TempSrc: Oral  Oral   SpO2: 98% 94% 99% 98%  Weight:      Height:         Time spent: 40 minutes  Author: Drue ONEIDA Potter, MD 08/15/2023 4:34 PM  For on call review [email or web address].

## 2023-08-16 DIAGNOSIS — L02619 Cutaneous abscess of unspecified foot: Secondary | ICD-10-CM | POA: Diagnosis not present

## 2023-08-16 DIAGNOSIS — L03119 Cellulitis of unspecified part of limb: Secondary | ICD-10-CM | POA: Diagnosis not present

## 2023-08-16 LAB — BASIC METABOLIC PANEL WITH GFR
Anion gap: 11 (ref 5–15)
BUN: 40 mg/dL — ABNORMAL HIGH (ref 6–20)
CO2: 24 mmol/L (ref 22–32)
Calcium: 8.4 mg/dL — ABNORMAL LOW (ref 8.9–10.3)
Chloride: 103 mmol/L (ref 98–111)
Creatinine, Ser: 2.4 mg/dL — ABNORMAL HIGH (ref 0.61–1.24)
GFR, Estimated: 30 mL/min — ABNORMAL LOW (ref >60)
Glucose, Bld: 96 mg/dL (ref 70–99)
Potassium: 3.6 mmol/L (ref 3.5–5.1)
Sodium: 138 mmol/L (ref 135–145)

## 2023-08-16 LAB — CBC WITH DIFFERENTIAL/PLATELET
Abs Immature Granulocytes: 0.04 K/uL (ref 0.00–0.07)
Basophils Absolute: 0 K/uL (ref 0.0–0.1)
Basophils Relative: 0 %
Eosinophils Absolute: 0.1 K/uL (ref 0.0–0.5)
Eosinophils Relative: 2 %
HCT: 31.2 % — ABNORMAL LOW (ref 39.0–52.0)
Hemoglobin: 10.5 g/dL — ABNORMAL LOW (ref 13.0–17.0)
Immature Granulocytes: 1 %
Lymphocytes Relative: 11 %
Lymphs Abs: 0.8 K/uL (ref 0.7–4.0)
MCH: 28.8 pg (ref 26.0–34.0)
MCHC: 33.7 g/dL (ref 30.0–36.0)
MCV: 85.7 fL (ref 80.0–100.0)
Monocytes Absolute: 0.6 K/uL (ref 0.1–1.0)
Monocytes Relative: 9 %
Neutro Abs: 5.3 K/uL (ref 1.7–7.7)
Neutrophils Relative %: 77 %
Platelets: 171 K/uL (ref 150–400)
RBC: 3.64 MIL/uL — ABNORMAL LOW (ref 4.22–5.81)
RDW: 12.7 % (ref 11.5–15.5)
WBC: 6.8 K/uL (ref 4.0–10.5)
nRBC: 0 % (ref 0.0–0.2)

## 2023-08-16 LAB — GLUCOSE, CAPILLARY
Glucose-Capillary: 115 mg/dL — ABNORMAL HIGH (ref 70–99)
Glucose-Capillary: 170 mg/dL — ABNORMAL HIGH (ref 70–99)
Glucose-Capillary: 236 mg/dL — ABNORMAL HIGH (ref 70–99)
Glucose-Capillary: 166 mg/dL — ABNORMAL HIGH (ref 70–99)

## 2023-08-16 MED ORDER — METOPROLOL TARTRATE 25 MG PO TABS
25.0000 mg | ORAL_TABLET | Freq: Two times a day (BID) | ORAL | Status: DC
  Administered 2023-08-16 – 2023-08-20 (×8): 25 mg via ORAL
  Filled 2023-08-16 (×8): qty 1

## 2023-08-16 NOTE — Evaluation (Signed)
 Physical Therapy Evaluation Patient Details Name: Jason Jennings MRN: [id number] DOB: 19-Sep-1965 Today's Date: 08/16/2023  History of Present Illness  Pt is a 58 y.o. male with a past medical history of diabetes and peripheral neuropathy who presented to the emergency department for worsening swelling redness of the left leg. MD assessment includes sepsis secondary to cellulitis of the left leg, superficial abscess of plantar surface of left foot, acute renal failure, hypokalemia, and HTN.  Clinical Impression  Pt was pleasant and motivated to participate during the session and put forth good effort throughout. Pt demonstrated decreased weight shift towards LLE during ambulation, but remained steady throughout and no LOBs occurred. Pt able to demonstrate proper sequence ascending/descending 4 steps with 1 rail. Pt did not demonstrate any safety concerns throughout today's session. Pt reported no adverse symptoms during the session with SpO2 and HR WNL throughout on room air. Will complete PT orders at this time but will reassess pt pending a change in status upon receipt of new PT orders.         If plan is discharge home, recommend the following: Assistance with cooking/housework;Assist for transportation   Can travel by private vehicle        Equipment Recommendations None recommended by PT  Recommendations for Other Services       Functional Status Assessment Patient has had a recent decline in their functional status and demonstrates the ability to make significant improvements in function in a reasonable and predictable amount of time.     Precautions / Restrictions Precautions Precautions: None Restrictions Weight Bearing Restrictions Per Provider Order: Yes LLE Weight Bearing Per Provider Order: Weight bearing as tolerated Other Position/Activity Restrictions: WBAT on LLE with post-op shoe      Mobility  Bed Mobility Overal bed mobility: Independent              General bed mobility comments: Pt was independent with coming to sitting EOB from supine without requiring physical assistance or use of bedrails    Transfers Overall transfer level: Needs assistance Equipment used: Rolling walker (2 wheels) Transfers: Sit to/from Stand Sit to Stand: Independent           General transfer comment: Pt independent for STS from EOB with RW, remained steady throughout and no LOBs occured    Ambulation/Gait Ambulation/Gait assistance: ModI Gait Distance (Feet): 80 Feet Assistive device: Rolling walker (2 wheels) Gait Pattern/deviations: Step-through pattern, Decreased weight shift to left       General Gait Details: Pt required RW to maintain balance, but remained steady throughout and no LOBs occured  Stairs Stairs: Yes Stairs assistance: ModI Stair Management: One rail Left Number of Stairs: 4 General stair comments: Pt ascended/descended 4 steps sideways with 1 rail, remained steady throughout and no LOBs occured  Wheelchair Mobility     Tilt Bed    Modified Rankin (Stroke Patients Only)       Balance Overall balance assessment: ModI Sitting-balance support: Feet supported, No upper extremity supported Sitting balance-Leahy Scale: Normal     Standing balance support: During functional activity, Reliant on assistive device for balance, Bilateral upper extremity supported Standing balance-Leahy Scale: Good Standing balance comment: Pt required RW for support secondary due to L foot I&D                             Pertinent Vitals/Pain Pain Assessment Pain Assessment: 0-10 Pain Score: 6  Pain Location: L Leg Pain Descriptors /  Indicators: Constant, Pressure Pain Intervention(s): Monitored during session    Home Living Family/patient expects to be discharged to:: Private residence Living Arrangements: Alone Available Help at Discharge: Family;Available PRN/intermittently Type of Home: House Home Access:  Stairs to enter Entrance Stairs-Rails: Doctor, general practice of Steps: 4   Home Layout: One level Home Equipment: Agricultural consultant (2 wheels);Rollator (4 wheels);Cane - single point;Wheelchair - manual;Transport chair;Grab bars - tub/shower      Prior Function Prior Level of Function : Independent/Modified Independent             Mobility Comments: Pt reported being independent with community distances with use of SPC. Pt reported 0 falls in the past 6 months. ADLs Comments: Pt reported being independent with ADLs without use of AD.     Extremity/Trunk Assessment   Upper Extremity Assessment Upper Extremity Assessment: Overall WFL for tasks assessed    Lower Extremity Assessment Lower Extremity Assessment: LLE deficits/detail LLE: Unable to fully assess due to pain       Communication   Communication Communication: No apparent difficulties    Cognition Arousal: Alert Behavior During Therapy: WFL for tasks assessed/performed   PT - Cognitive impairments: No apparent impairments                         Following commands: Intact       Cueing Cueing Techniques: Verbal cues, Gestural cues     General Comments      Exercises     Assessment/Plan    PT Assessment Patient does not need any further PT services  PT Problem List         PT Treatment Interventions      PT Goals (Current goals can be found in the Care Plan section)  Acute Rehab PT Goals Patient Stated Goal: to get back to volunteering and gardening PT Goal Formulation: With patient Time For Goal Achievement: 08/29/23 Potential to Achieve Goals: Good    Frequency       Co-evaluation               AM-PAC PT 6 Clicks Mobility  Outcome Measure Help needed turning from your back to your side while in a flat bed without using bedrails?: None Help needed moving from lying on your back to sitting on the side of a flat bed without using bedrails?: None Help needed  moving to and from a bed to a chair (including a wheelchair)?: None Help needed standing up from a chair using your arms (e.g., wheelchair or bedside chair)?: None Help needed to walk in hospital room?: None Help needed climbing 3-5 steps with a railing? : None 6 Click Score: 24    End of Session Equipment Utilized During Treatment: Gait belt Activity Tolerance: Patient tolerated treatment well Patient left: in chair;with call bell/phone within reach;with chair alarm set Nurse Communication: Mobility status PT Visit Diagnosis: Other abnormalities of gait and mobility (R26.89);Difficulty in walking, not elsewhere classified (R26.2);Pain Pain - Right/Left: Left Pain - part of body: Leg;Ankle and joints of foot    Time: 9048-8976 PT Time Calculation (min) (ACUTE ONLY): 32 min   Charges:                 Leontine Ingles, SPT 08/16/23, 1:49 PM

## 2023-08-16 NOTE — Hospital Course (Addendum)
 Hospital course / significant events:   HPI: Jason Jennings is a 58 y.o. 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  He was in wound care for approximately 3 months following debridement and finally achieved wound closure in March 2025.  Presented to ED 08/12/23 w/ 1 day history of increasing pain and swelling and erythema in his lower leg, significant pain at the ball of his foot under the callus that he describes as feeling like an abscess under there, also w/ new nausea and vomiting   07/05: admitted to hospitalist w/ sepsis d/t cellulitis and superficial abscess plantar surface L foot. Also AKI. MRI left foot without contrast: Ulceration plantar aspect fourth metatarsal head without obvious large abscess and reactive marrow edema in the fourth proximal phalanx without evidence of osteomyelitis.  07/06: continued on IV abx  07/07: podiatry consult - bedside I&D, ok to transition to po abx when cultures available 07/08-07/09: still significant erythema/tenderness, continue abx  07/10: culture (+)MRSA, erythema/swelling a bit worse today despite being on the po linezolid  so will go w/ IV tx  07/11: Cellulitis improving, will stay 1-2 more day on IV antibiotics 07/12: pt feels IV abx are helping and requests one more day on this, he is concerned about renal function, ok for another day given erythema about same as yesterday / high risk      Consultants:  podiatry  Procedures/Surgeries: none      ASSESSMENT & PLAN:   Sepsis secondary to cellulitis of the left leg-present on admission (+)MRSA  Superficial abscess of plantar surface of left foot s/p debridement at bedside by podiatry on 08/14/23 Patient met sepsis criteria with WBC 12.1, pulse 110 in the setting of cellulitis. MRI did not show evidence of osteomyelitis Normal ABIs in January 2025 Abscess culture  (+)MRSA, suspect same w/ cellulitis, anticipate 14 days po therapy on discharge, but will restart IV linezolid  for now given worsening erythema/pain on exam despite po linezold -uncertain if improvement due to timing or if there may be some improvement in tissue penetration with IV (IV and p.o. linezolid  had similar bioavailability so should not have made much difference) at any rate we will do 1 more day IV antibiotics and if still continuing to improve we will switch back to p.o. and anticipate discharge likely from tomorrow Wound care: daily dressing change to the left foot with gentamicin  ointment and cover wound with Hydrofera Blue or Xeroform 4 x 4 gauze Kerlix and Ace roll WB status: Weightbearing as tolerated in postop shoe follow-up with [place] [facility] upon discharge.  He had trouble following up with podiatry office due to insurance issues. Podiatry team has s/o, reengage prn   Acute renal failure / AKI superimposed on stage 3b chronic kidney disease  Baseline creatinine is around 2.3 Renal function improved to baseline Continue holding ARB Avoid nephrotoxic agents Monitor BMP Encourage po fluids may need to give IV if not improving Low threshold for nephrology consult    Hypokalemia Replace as needed Monitor BMP   Type 2 diabetes mellitus with hyperlipidemia (HCC) Continue sliding scale   HTN (hypertension) Continue holding losartan  d/t renal function  Calcium  channel blocker, beta blocker    Neuropathy, diabetic  Continue gabapentin    H/O colon cancer, stage II Treated at [facility]  Status post neoadjuvant chemoradiation with low anterior resection in 2016. Recurrence treated with chemoradiation in 2020  and further surgery Follow outpatient       Class 1 obesity based on BMI: Body mass index is 30.46 kg/m.[name] Significantly low or high BMI is associated with higher medical risk.  Underweight - under 18  overweight - 25 to 29 obese - 30 or more Class 1  obesity: BMI of 30.0 to 34 Class 2 obesity: BMI of 35.0 to 39 Class 3 obesity: BMI of 40.0 to 49 Super Morbid Obesity: BMI 50-59 Super-super Morbid Obesity: BMI 60+ Healthy nutrition and physical activity advised as adjunct to other disease management and risk reduction treatments    DVT prophylaxis: heparin  IV fluids: no continuous IV fluids  Nutrition: carb modified Central lines / other devices: none  Code Status: FULL CODE ACP documentation reviewed:  none on file in [facility]  TOC needs: TBD Medical barriers to dispo: IV abx. Expected medical readiness for discharge pending clinical improvement hopefully tomorrow

## 2023-08-16 NOTE — Plan of Care (Signed)

## 2023-08-16 NOTE — Progress Notes (Signed)
 PROGRESS NOTE    Jason Jennings   FMW:[id number] DOB: November 17, 1965  DOA: 08/12/2023 Date of Service: 08/16/23 which is hospital day 4  PCP: Valere Ozell Shove, MD    Hospital course / significant events:   HPI: Jason Jennings is a 58 y.o. 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  He was in wound care for approximately 3 months following debridement and finally achieved wound closure in March 2025.  Presented to ED 08/12/23 w/ 1 day history of increasing pain and swelling and erythema in his lower leg, significant pain at the ball of his foot under the callus that he describes as feeling like an abscess under there, also w/ new nausea and vomiting   07/05: admitted to hospitalist w/ sepsis d/t cellulitis and superficial abscess plantar surface L foot. Also AKI. MRI left foot without contrast: Ulceration plantar aspect fourth metatarsal head without obvious large abscess and reactive marrow edema in the fourth proximal phalanx without evidence of osteomyelitis.  07/06: continued on IV abx  07/07: podiatry consult - bedside I&D, ok to transition to po abx when cultures available 07/08: still significant erythema/tenderness, continue IV abx      Consultants:  podiatry  Procedures/Surgeries: none      ASSESSMENT & PLAN:   Sepsis secondary to cellulitis of the left leg-present on admission Superficial abscess of plantar surface of left foot s/p debridement at bedside by podiatry on 08/14/23 Diabetic foot ulcer  Patient met sepsis criteria with WBC 12.1, pulse 110 in the setting of cellulitis. MRI did not show evidence of osteomyelitis Normal ABIs in January 2025 Abx as below  Continue to follow-up on culture results to guide outpatient antibiotic therapy and transition to p.o. antibiotics when culture data is available and recommend 14 days of culture  directed oral antibiotics - Cx (+)Staph pending susceptibilities  Wound care: Recommend daily dressing change to the left foot with gentamicin  ointment and cover wound with Hydrofera Blue or Xeroform 4 x 4 gauze Kerlix and Ace roll WB status: Weightbearing as tolerated in postop shoe, will have his postop shoe brought to him from home follow-up with wound care center recommend referral to [place] [facility] upon discharge.  He had trouble following up with our office due to insurance issues. Podiatry team has s/o, reengage prn    Acute renal failure superimposed on stage 3b chronic kidney disease  Baseline creatinine is around 2.3 Renal function improved to baseline Continue holding ARB Avoid nephrotoxic agents Monitor BMP   Hypokalemia Replace as needed Monitor BMP   Type 2 diabetes mellitus with hyperlipidemia (HCC) Continue sliding scale   HTN (hypertension) Continue holding losartan  d/t renal function  Calcium  channel blocker Lopressor  as needed   Neuropathy, diabetic (HCC) Continue gabapentin    H/O colon cancer, stage II Treated at [facility]  Status post neoadjuvant chemoradiation with low anterior resection in 2016. Recurrence treated with chemoradiation in 2020 and further surgery No evidence of recurrence   (HCC)    Class 1 obesity based on BMI: Body mass index is 30.46 kg/m.[name] Significantly low or high BMI is associated with higher medical risk.  Underweight - under 18  overweight - 25 to 29 obese - 30 or more Class 1 obesity: BMI of 30.0 to 34 Class 2 obesity: BMI of 35.0 to 39 Class 3 obesity: BMI of 40.0 to 49 Super Morbid Obesity: BMI 50-59  Super-super Morbid Obesity: BMI 60+ Healthy nutrition and physical activity advised as adjunct to other disease management and risk reduction treatments    DVT prophylaxis: heparin  IV fluids: no continuous IV fluids  Nutrition: carb modified Central lines / other devices: none  Code Status: FULL CODE ACP  documentation reviewed:  none on file in [facility]  TOC needs: TBD Medical barriers to dispo: IV abx. Expected medical readiness for discharge tomorrow / day after .              Subjective / Brief ROS:  Patient reports warmth and pain LLE, some improved from yesterday but not much Denies CP/SOB.  Pain controlled.  Denies new weakness.  Tolerating diet.  Reports no concerns w/ urination/defecation.   Family Communication: none at this time    Objective Findings:  Vitals:   08/16/23 0429 08/16/23 0733 08/16/23 1611 08/16/23 1700  BP: (!) 149/67 (!) 147/67 (!) 174/79 (!) 168/85  Pulse: (!) 58 66 69 72  Resp: 18 16 16 16   Temp: 98.3 F (36.8 C) 99 F (37.2 C) 98.2 F (36.8 C)   TempSrc:  Oral Oral   SpO2: 97% 99% 100%   Weight:      Height:        Intake/Output Summary (Last 24 hours) at 08/16/2023 1721 Last data filed at 08/16/2023 1300 Gross per 24 hour  Intake 480 ml  Output 800 ml  Net -320 ml   Filed Weights   08/12/23 1849 08/12/23 2022  Weight: 80.3 kg 85.6 kg    Examination:  Physical Exam Constitutional:      General: He is not in acute distress. Cardiovascular:     Rate and Rhythm: Normal rate and regular rhythm.  Pulmonary:     Effort: Pulmonary effort is normal.     Breath sounds: Normal breath sounds.  Abdominal:     Palpations: Abdomen is soft.  Musculoskeletal:        General: Swelling (LLE) and tenderness (LLE) present.     Right lower leg: No edema.     Left lower leg: Edema present.  Skin:    General: Skin is warm.     Findings: Erythema (LLE) present.  Neurological:     General: No focal deficit present.     Mental Status: He is alert and oriented to person, place, and time.  Psychiatric:        Mood and Affect: Mood normal.        Behavior: Behavior normal.          Scheduled Medications:   amLODipine   5 mg Oral Daily   vitamin C   500 mg Oral BID   gabapentin   100 mg Oral TID   gentamicin  ointment   Topical Daily    heparin   5,000 Units Subcutaneous Q8H   insulin  aspart  0-9 Units Subcutaneous TID WC   insulin  aspart protamine- aspart  30 Units Subcutaneous BID WC   insulin  glargine-yfgn  10 Units Subcutaneous Daily   linezolid   600 mg Oral Q12H   metoCLOPramide  (REGLAN ) injection  10 mg Intravenous Q6H   multivitamin with minerals  1 tablet Oral Daily   nutrition supplement (JUVEN)  1 packet Oral BID BM   Ensure Max Protein  11 oz Oral QHS   rosuvastatin   20 mg Oral Daily   tamsulosin   0.4 mg Oral Daily   zinc  sulfate (50mg  elemental zinc )  220 mg Oral Daily    Continuous Infusions:  ampicillin -sulbactam (UNASYN ) IV 3 g (  08/16/23 1717)   ondansetron  (ZOFRAN ) IV      PRN Medications:  acetaminophen  **OR** acetaminophen , fentaNYL  (SUBLIMAZE ) injection, methocarbamol , metoprolol  tartrate, ondansetron  **OR** ondansetron  (ZOFRAN ) IV, oxyCODONE , polyethylene glycol  Antimicrobials from admission:  Anti-infectives (From admission, onward)    Start     Dose/Rate Route Frequency Ordered Stop   08/15/23 2200  linezolid  (ZYVOX ) tablet 600 mg        600 mg Oral Every 12 hours 08/15/23 1157     08/14/23 1600  Ampicillin -Sulbactam (UNASYN ) 3 g in sodium chloride  0.9 % 100 mL IVPB        3 g 200 mL/hr over 30 Minutes Intravenous Every 6 hours 08/14/23 1528     08/13/23 2000  linezolid  (ZYVOX ) IVPB 600 mg  Status:  Discontinued        600 mg 300 mL/hr over 60 Minutes Intravenous Every 12 hours 08/13/23 1407 08/15/23 1157   08/12/23 2000  vancomycin  (VANCOREADY) IVPB 1750 mg/350 mL        1,750 mg 175 mL/hr over 120 Minutes Intravenous  Once 08/12/23 1919 08/12/23 2333   08/12/23 1920  vancomycin  variable dose per unstable renal function (pharmacist dosing)  Status:  Discontinued         Does not apply See admin instructions 08/12/23 1920 08/13/23 1407   08/12/23 1845  vancomycin  (VANCOCIN ) IVPB 1000 mg/200 mL premix  Status:  Discontinued        1,000 mg 200 mL/hr over 60 Minutes Intravenous  Once  08/12/23 1831 08/12/23 1919   08/12/23 1615  cefTRIAXone  (ROCEPHIN ) 2 g in sodium chloride  0.9 % 100 mL IVPB  Status:  Discontinued        2 g 200 mL/hr over 30 Minutes Intravenous  Once 08/12/23 1610 08/12/23 1612   08/12/23 1615  cefTRIAXone  (ROCEPHIN ) 2 g in sodium chloride  0.9 % 100 mL IVPB        2 g 200 mL/hr over 30 Minutes Intravenous Once 08/12/23 1613 08/12/23 1748           Data Reviewed:  I have personally reviewed the following...  CBC: Recent Labs  Lab 08/12/23 1534 08/14/23 0522 08/15/23 0532 08/16/23 0529  WBC 12.1* 8.4 7.4 6.8  NEUTROABS 10.6* 7.2 5.9 5.3  HGB 12.0* 10.4* 10.4* 10.5*  HCT 34.3* 30.7* 30.9* 31.2*  MCV 83.5 87.0 86.6 85.7  PLT 172 144* 146* 171   Basic Metabolic Panel: Recent Labs  Lab 08/12/23 1534 08/13/23 0836 08/14/23 0522 08/15/23 0532 08/16/23 0529  NA 132* 135 138 136 138  K 3.8 3.9 3.3* 3.6 3.6  CL 94* 103 105 100 103  CO2 25 24 26 26 24   GLUCOSE 401* 304* 69* 168* 96  BUN 51* 42* 39* 40* 40*  CREATININE 3.25* 2.73* 2.58* 2.27* 2.40*  CALCIUM  11.1* 8.9 8.6* 8.4* 8.4*  PHOS  --  3.8  --   --   --    GFR: Estimated Creatinine Clearance: 34 mL/min (A) (by C-G formula based on SCr of 2.4 mg/dL (H)). Liver Function Tests: Recent Labs  Lab 08/12/23 1534 08/13/23 0836  AST 32  --   ALT 15  --   ALKPHOS 73  --   BILITOT 0.8  --   PROT 6.3*  --   ALBUMIN 3.3* 2.5*   No results for input(s): LIPASE, AMYLASE in the last 168 hours. No results for input(s): AMMONIA in the last 168 hours. Coagulation Profile: No results for input(s): INR, PROTIME in the last 168 hours.  Cardiac Enzymes: No results for input(s): CKTOTAL, CKMB, CKMBINDEX, TROPONINI in the last 168 hours. BNP (last 3 results) No results for input(s): PROBNP in the last 8760 hours. HbA1C: No results for input(s): HGBA1C in the last 72 hours. CBG: Recent Labs  Lab 08/15/23 1639 08/15/23 2109 08/16/23 0732 08/16/23 1139  08/16/23 1614  GLUCAP 191* 150* 115* 170* 236*   Lipid Profile: No results for input(s): CHOL, HDL, LDLCALC, TRIG, CHOLHDL, LDLDIRECT in the last 72 hours. Thyroid Function Tests: No results for input(s): TSH, T4TOTAL, FREET4, T3FREE, THYROIDAB in the last 72 hours. Anemia Panel: No results for input(s): VITAMINB12, FOLATE, FERRITIN, TIBC, IRON, RETICCTPCT in the last 72 hours. Most Recent Urinalysis On File:     Component Value Date/Time   COLORURINE YELLOW (A) 08/13/2023 1030   APPEARANCEUR CLOUDY (A) 08/13/2023 1030   LABSPEC 1.017 08/13/2023 1030   PHURINE 5.0 08/13/2023 1030   GLUCOSEU >=500 (A) 08/13/2023 1030   HGBUR SMALL (A) 08/13/2023 1030   BILIRUBINUR NEGATIVE 08/13/2023 1030   KETONESUR NEGATIVE 08/13/2023 1030   PROTEINUR >=300 (A) 08/13/2023 1030   NITRITE NEGATIVE 08/13/2023 1030   LEUKOCYTESUR NEGATIVE 08/13/2023 1030   Sepsis Labs: @LABRCNTIP (procalcitonin:4,lacticidven:4) Microbiology: Recent Results (from the past 240 hours)  Blood culture (routine x 2)     Status: None (Preliminary result)   Collection Time: 08/12/23  3:34 PM   Specimen: BLOOD  Result Value Ref Range Status   Specimen Description BLOOD BLOOD RIGHT ARM  Final   Special Requests   Final    BOTTLES DRAWN AEROBIC AND ANAEROBIC Blood Culture results may not be optimal due to an inadequate volume of blood received in culture bottles   Culture   Final    NO GROWTH 4 DAYS Performed at [facility], [address] [place], [place] [address]    Report Status PENDING  Incomplete  Blood culture (routine x 2)     Status: None (Preliminary result)   Collection Time: 08/12/23  5:10 PM   Specimen: BLOOD  Result Value Ref Range Status   Specimen Description BLOOD BLOOD RIGHT HAND  Final   Special Requests   Final    BOTTLES DRAWN AEROBIC AND ANAEROBIC Blood Culture adequate volume   Culture   Final    NO GROWTH 4 DAYS Performed at [facility], [address], [place], [place] [address]    Report Status PENDING  Incomplete  Aerobic Culture w Gram Stain (superficial specimen)     Status: None (Preliminary result)   Collection Time: 08/14/23  1:37 PM   Specimen: Wound  Result Value Ref Range Status   Specimen Description   Final    WOUND Performed at [facility], [address] [place], [place] [address]    Special Requests   Final     LEFT FOOT Performed at [facility], [address] [place], [place] [address]    Gram Stain   Final    RARE WBC PRESENT, PREDOMINANTLY PMN ABUNDANT GRAM POSITIVE COCCI ABUNDANT GRAM NEGATIVE RODS FEW GRAM POSITIVE RODS    Culture   Final    MODERATE STAPHYLOCOCCUS AUREUS SUSCEPTIBILITIES TO FOLLOW CULTURE REINCUBATED FOR BETTER GROWTH Performed at [facility], [address], [place], [place] [address]    Report Status PENDING  Incomplete      Radiology Studies last 3 days: MR FOOT LEFT WO CONTRAST Result Date: 08/12/2023 CLINICAL DATA:  Diabetic foot swelling. Osteomyelitis suspected. History of polyneuropathy and chronic kidney  disease. Undergoing wound care for 3 months with previous debridement. Increasing pain, swelling and erythema along the ball of the foot. EXAM: MRI OF THE LEFT FOOT WITHOUT CONTRAST TECHNIQUE: Multiplanar, multisequence MR imaging of the left foot was performed. No intravenous contrast was administered. COMPARISON:  Radiographs 08/12/2023 and 01/28/2023.  MRI 01/29/2023. FINDINGS: Bones/Joint/Cartilage There is soft tissue ulceration along the plantar aspect of the 4th metatarsophalangeal joint and 4th proximal phalanx. Mild nonspecific marrow T2 hyperintensity within the base of the 4th proximal phalanx without associated T1 marrow signal abnormality or cortical destruction. The 4th metatarsal head appears normal. No significant joint effusion. The additional rays appear stable, without acute or suspicious findings. Stable mild  degenerative changes at the 1st metatarsophalangeal joint and chronic erosions within the 2nd metatarsal head. Interval ankylosis of the 5th middle and distal phalanges without suspicious marrow abnormality. The alignment is normal at the Lisfranc joint. Ligaments Intact Lisfranc ligament. The collateral ligaments of the metatarsophalangeal joints are intact. Muscles and Tendons Chronic forefoot muscular atrophy and T2 hyperintensity, similar to previous MRI. The forefoot tendons appear intact, without tenosynovitis. Soft tissues As above, focal skin ulceration along the plantar aspect of the 4th metatarsophalangeal joint and base of the 4th proximal phalanx. There is a small amount of adjacent ill-defined subcutaneous fluid without organized fluid collection. Dorsal subcutaneous edema without focal fluid collection, improved from the previous MRI. IMPRESSION: 1. Soft tissue ulceration along the plantar aspect of the 4th metatarsophalangeal joint and base of the 4th proximal phalanx. No organized fluid collection to suggest abscess. 2. Mild nonspecific marrow T2 hyperintensity in the base of the 4th proximal phalanx, reactive etiology favored. No associated T1 marrow signal abnormality or cortical destruction to strongly suggest osteomyelitis. No evidence of septic arthritis. 3. Dorsal subcutaneous edema without focal fluid collection, improved from previous MRI. 4. Stable chronic muscular atrophy and T2 hyperintensity, consistent with chronic denervation. Electronically Signed   By: Elsie Perone M.D.   On: 08/12/2023 19:44         Laneta Blunt, DO [facility] 08/16/2023, 5:21 PM    Dictation software may have been used to generate the above note. Typos may occur and escape review in typed/dictated notes. Please contact Dr Blunt directly for clarity if needed.  Staff may message me via secure chat in Epic  but this may not receive an immediate response,  please page me for urgent  matters!  If 7PM-7AM, please contact night coverage [email or web address]

## 2023-08-16 NOTE — Progress Notes (Signed)
 OT Cancellation Note  Patient Details Name: Jason Jennings MRN: [id number] DOB: December 13, 1965   Cancelled Treatment:    Reason Eval/Treat Not Completed: Patient declined, no reason specified.  Per PT note, patient declined OT evaluation at this time.   Harlene Sharps OTR/L   Harlene LITTIE Sharps 08/16/2023, 3:09 PM

## 2023-08-17 ENCOUNTER — Telehealth (HOSPITAL_COMMUNITY): Payer: Self-pay | Admitting: Pharmacy Technician

## 2023-08-17 ENCOUNTER — Other Ambulatory Visit (HOSPITAL_COMMUNITY): Payer: Self-pay

## 2023-08-17 ENCOUNTER — Telehealth (HOSPITAL_COMMUNITY): Payer: Self-pay

## 2023-08-17 DIAGNOSIS — L03119 Cellulitis of unspecified part of limb: Secondary | ICD-10-CM | POA: Diagnosis not present

## 2023-08-17 DIAGNOSIS — L02619 Cutaneous abscess of unspecified foot: Secondary | ICD-10-CM | POA: Diagnosis not present

## 2023-08-17 LAB — AEROBIC CULTURE W GRAM STAIN (SUPERFICIAL SPECIMEN)

## 2023-08-17 LAB — BASIC METABOLIC PANEL WITH GFR
Anion gap: 12 (ref 5–15)
BUN: 51 mg/dL — ABNORMAL HIGH (ref 6–20)
CO2: 25 mmol/L (ref 22–32)
Calcium: 8.4 mg/dL — ABNORMAL LOW (ref 8.9–10.3)
Chloride: 102 mmol/L (ref 98–111)
Creatinine, Ser: 2.82 mg/dL — ABNORMAL HIGH (ref 0.61–1.24)
GFR, Estimated: 25 mL/min — ABNORMAL LOW (ref >60)
Glucose, Bld: 135 mg/dL — ABNORMAL HIGH (ref 70–99)
Potassium: 4 mmol/L (ref 3.5–5.1)
Sodium: 139 mmol/L (ref 135–145)

## 2023-08-17 LAB — GLUCOSE, CAPILLARY
Glucose-Capillary: 146 mg/dL — ABNORMAL HIGH (ref 70–99)
Glucose-Capillary: 123 mg/dL — ABNORMAL HIGH (ref 70–99)
Glucose-Capillary: 169 mg/dL — ABNORMAL HIGH (ref 70–99)
Glucose-Capillary: 168 mg/dL — ABNORMAL HIGH (ref 70–99)

## 2023-08-17 MED ORDER — SODIUM CHLORIDE 0.9 % IV SOLN
3.0000 g | Freq: Two times a day (BID) | INTRAVENOUS | Status: DC
  Administered 2023-08-17: 3 g via INTRAVENOUS
  Filled 2023-08-17: qty 8

## 2023-08-17 MED ORDER — LINEZOLID 600 MG/300ML IV SOLN
600.0000 mg | Freq: Two times a day (BID) | INTRAVENOUS | Status: DC
  Administered 2023-08-17 – 2023-08-20 (×6): 600 mg via INTRAVENOUS
  Filled 2023-08-17 (×6): qty 300

## 2023-08-17 NOTE — Telephone Encounter (Signed)
 Pharmacy Patient Advocate Encounter   Received notification from Inpatient Request that prior authorization for Linezolid  600MG  tablets is required/requested.   Insurance verification completed.   The patient is insured through [facility] .   Per test claim: PA required; PA submitted to above mentioned insurance via CoverMyMeds Key/confirmation #/EOC [id number] Status is pending

## 2023-08-17 NOTE — Telephone Encounter (Signed)
 Pharmacy Patient Advocate Encounter  Received notification from [facility] that Prior Authorization for Linezolid  600MG  tablets  has been APPROVED from 08/17/2023 to 11/17/2023. Ran test claim, Copay is $23.10. This test claim was processed through [facility]- copay amounts may vary at other pharmacies due to pharmacy/plan contracts, or as the patient moves through the different stages of their insurance plan.   PA #/Case ID/Reference #: [id number]

## 2023-08-17 NOTE — Telephone Encounter (Signed)
 Pharmacy Patient Advocate Encounter  Insurance verification completed.    The patient is insured through [facility]. Patient has Medicare and is not eligible for a copay card, but may be able to apply for patient assistance or Medicare RX Payment Plan (Patient Must reach out to their plan, if eligible for payment plan), if available.    Ran test claim for Linezolid  600mg  and currently needs a prior authorization.  This test claim was processed through [facility]- copay amounts may vary at other pharmacies due to pharmacy/plan contracts, or as the patient moves through the different stages of their insurance plan.Pharmacy Patient Advocate Encounter

## 2023-08-17 NOTE — Progress Notes (Addendum)
 PROGRESS NOTE    Jason Jennings   FMW:[id number] DOB: Jan 14, 1966  DOA: 08/12/2023 Date of Service: 08/18/23 which is hospital day 6  PCP: Valere Ozell Shove, MD    Hospital course / significant events:   HPI: Jason Jennings is a 57 y.o. 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  He was in wound care for approximately 3 months following debridement and finally achieved wound closure in March 2025.  Presented to ED 08/12/23 w/ 1 day history of increasing pain and swelling and erythema in his lower leg, significant pain at the ball of his foot under the callus that he describes as feeling like an abscess under there, also w/ new nausea and vomiting   07/05: admitted to hospitalist w/ sepsis d/t cellulitis and superficial abscess plantar surface L foot. Also AKI. MRI left foot without contrast: Ulceration plantar aspect fourth metatarsal head without obvious large abscess and reactive marrow edema in the fourth proximal phalanx without evidence of osteomyelitis.  07/06: continued on IV abx  07/07: podiatry consult - bedside I&D, ok to transition to po abx when cultures available 07/08-07/09: still significant erythema/tenderness, continue abx  07/10: culture (+)MRSA, erythema/swelling a bit worse today despite being on the po linezolid  so will go w/ IV tx      Consultants:  podiatry  Procedures/Surgeries: none      ASSESSMENT & PLAN:   Sepsis secondary to cellulitis of the left leg-present on admission (+)MRSA  Superficial abscess of plantar surface of left foot s/p debridement at bedside by podiatry on 08/14/23 Diabetic foot ulcer  Patient met sepsis criteria with WBC 12.1, pulse 110 in the setting of cellulitis. MRI did not show evidence of osteomyelitis Normal ABIs in January 2025 Abscess culture (+)MRSA, suspect same w/ cellulitis, anticipate 14 days  po therapy on discharge, but will restart IV linezolid  for now given worsening erythema/pain on exam despite po linezold Wound care: daily dressing change to the left foot with gentamicin  ointment and cover wound with Hydrofera Blue or Xeroform 4 x 4 gauze Kerlix and Ace roll WB status: Weightbearing as tolerated in postop shoe follow-up with [facility] upon discharge.  He had trouble following up with podiatry office due to insurance issues. Podiatry team has s/o, reengage prn   Acute renal failure / AKI superimposed on stage 3b chronic kidney disease  Baseline creatinine is around 2.3 Renal function improved to baseline Continue holding ARB Avoid nephrotoxic agents Monitor BMP Encourage po fluids may need to give IV if not improving Low threshold for nephrology consult    Hypokalemia Replace as needed Monitor BMP   Type 2 diabetes mellitus with hyperlipidemia (HCC) Continue sliding scale   HTN (hypertension) Continue holding losartan  d/t renal function  Calcium  channel blocker, beta blocker    Neuropathy, diabetic  Continue gabapentin    H/O colon cancer, stage II Treated at [facility]  Status post neoadjuvant chemoradiation with low anterior resection in 2016. Recurrence treated with chemoradiation in 2020 and further surgery Follow outpatient       Class 1 obesity based on BMI: Body mass index is 30.46 kg/m.[name] Significantly low or high BMI is associated with higher medical risk.  Underweight - under 18  overweight - 25 to 29 obese - 30 or more Class 1 obesity: BMI of 30.0 to 34 Class 2 obesity: BMI of 35.0 to 39 Class 3 obesity:  BMI of 40.0 to 49 Super Morbid Obesity: BMI 50-59 Super-super Morbid Obesity: BMI 60+ Healthy nutrition and physical activity advised as adjunct to other disease management and risk reduction treatments    DVT prophylaxis: heparin  IV fluids: no continuous IV fluids  Nutrition: carb modified Central lines / other devices:  none  Code Status: FULL CODE ACP documentation reviewed:  none on file in [facility]  TOC needs: TBD Medical barriers to dispo: IV abx. Expected medical readiness for discharge pending clinical improvement hopefully next few days              Subjective / Brief ROS:  Patient reports warmth and pain LLE, a bit worse from yesterday Denies CP/SOB.  Pain controlled.  Denies new weakness.  Tolerating diet.  Reports no concerns w/ urination/defecation.   Family Communication: none at this time    Objective Findings:  Vitals:   08/17/23 1940 08/17/23 2122 08/18/23 0408 08/18/23 0748  BP: (!) 147/70 (!) 147/70 136/70 (!) 148/78  Pulse: 65 65 64 66  Resp: 20  16 17   Temp: 97.8 F (36.6 C)  98.1 F (36.7 C) 98.9 F (37.2 C)  TempSrc: Oral  Oral   SpO2: 100%  97% 98%  Weight:      Height:        Intake/Output Summary (Last 24 hours) at 08/18/2023 9177 Last data filed at 08/18/2023 0630 Gross per 24 hour  Intake 398.47 ml  Output --  Net 398.47 ml   Filed Weights   08/12/23 1849 08/12/23 2022  Weight: 80.3 kg 85.6 kg    Examination:  Physical Exam Constitutional:      General: He is not in acute distress. Cardiovascular:     Rate and Rhythm: Normal rate and regular rhythm.  Pulmonary:     Effort: Pulmonary effort is normal.     Breath sounds: Normal breath sounds.  Abdominal:     Palpations: Abdomen is soft.  Musculoskeletal:        General: Swelling (LLE) and tenderness (LLE) present.     Right lower leg: No edema.     Left lower leg: Edema present.  Skin:    General: Skin is warm.     Findings: Erythema (LLE) present.  Neurological:     General: No focal deficit present.     Mental Status: He is alert and oriented to person, place, and time.  Psychiatric:        Mood and Affect: Mood normal.        Behavior: Behavior normal.          Scheduled Medications:   amLODipine   5 mg Oral Daily   vitamin C   500 mg Oral BID   gabapentin   100 mg Oral  TID   gentamicin  ointment   Topical Daily   heparin   5,000 Units Subcutaneous Q8H   insulin  aspart  0-9 Units Subcutaneous TID WC   insulin  aspart protamine- aspart  30 Units Subcutaneous BID WC   insulin  glargine-yfgn  10 Units Subcutaneous Daily   metoCLOPramide  (REGLAN ) injection  10 mg Intravenous Q6H   metoprolol  tartrate  25 mg Oral BID   multivitamin with minerals  1 tablet Oral Daily   nutrition supplement (JUVEN)  1 packet Oral BID BM   Ensure Max Protein  11 oz Oral QHS   rosuvastatin   20 mg Oral Daily   tamsulosin   0.4 mg Oral Daily   zinc  sulfate (50mg  elemental zinc )  220 mg Oral Daily  Continuous Infusions:  linezolid  (ZYVOX ) IV Stopped (08/17/23 2229)   ondansetron  (ZOFRAN ) IV      PRN Medications:  acetaminophen  **OR** acetaminophen , fentaNYL  (SUBLIMAZE ) injection, methocarbamol , metoprolol  tartrate, ondansetron  **OR** ondansetron  (ZOFRAN ) IV, oxyCODONE , polyethylene glycol  Antimicrobials from admission:  Anti-infectives (From admission, onward)    Start     Dose/Rate Route Frequency Ordered Stop   08/17/23 2200  linezolid  (ZYVOX ) IVPB 600 mg        600 mg 300 mL/hr over 60 Minutes Intravenous Every 12 hours 08/17/23 1423     08/17/23 1000  Ampicillin -Sulbactam (UNASYN ) 3 g in sodium chloride  0.9 % 100 mL IVPB  Status:  Discontinued        3 g 200 mL/hr over 30 Minutes Intravenous Every 12 hours 08/17/23 0357 08/17/23 1236   08/15/23 2200  linezolid  (ZYVOX ) tablet 600 mg  Status:  Discontinued        600 mg Oral Every 12 hours 08/15/23 1157 08/17/23 1236   08/14/23 1600  Ampicillin -Sulbactam (UNASYN ) 3 g in sodium chloride  0.9 % 100 mL IVPB  Status:  Discontinued        3 g 200 mL/hr over 30 Minutes Intravenous Every 6 hours 08/14/23 1528 08/17/23 0357   08/13/23 2000  linezolid  (ZYVOX ) IVPB 600 mg  Status:  Discontinued        600 mg 300 mL/hr over 60 Minutes Intravenous Every 12 hours 08/13/23 1407 08/15/23 1157   08/12/23 2000  vancomycin  (VANCOREADY)  IVPB 1750 mg/350 mL        1,750 mg 175 mL/hr over 120 Minutes Intravenous  Once 08/12/23 1919 08/12/23 2333   08/12/23 1920  vancomycin  variable dose per unstable renal function (pharmacist dosing)  Status:  Discontinued         Does not apply See admin instructions 08/12/23 1920 08/13/23 1407   08/12/23 1845  vancomycin  (VANCOCIN ) IVPB 1000 mg/200 mL premix  Status:  Discontinued        1,000 mg 200 mL/hr over 60 Minutes Intravenous  Once 08/12/23 1831 08/12/23 1919   08/12/23 1615  cefTRIAXone  (ROCEPHIN ) 2 g in sodium chloride  0.9 % 100 mL IVPB  Status:  Discontinued        2 g 200 mL/hr over 30 Minutes Intravenous  Once 08/12/23 1610 08/12/23 1612   08/12/23 1615  cefTRIAXone  (ROCEPHIN ) 2 g in sodium chloride  0.9 % 100 mL IVPB        2 g 200 mL/hr over 30 Minutes Intravenous Once 08/12/23 1613 08/12/23 1748           Data Reviewed:  I have personally reviewed the following...  CBC: Recent Labs  Lab 08/12/23 1534 08/14/23 0522 08/15/23 0532 08/16/23 0529  WBC 12.1* 8.4 7.4 6.8  NEUTROABS 10.6* 7.2 5.9 5.3  HGB 12.0* 10.4* 10.4* 10.5*  HCT 34.3* 30.7* 30.9* 31.2*  MCV 83.5 87.0 86.6 85.7  PLT 172 144* 146* 171   Basic Metabolic Panel: Recent Labs  Lab 08/13/23 0836 08/14/23 0522 08/15/23 0532 08/16/23 0529 08/17/23 0240 08/18/23 0527  NA 135 138 136 138 139 137  K 3.9 3.3* 3.6 3.6 4.0 4.2  CL 103 105 100 103 102 101  CO2 24 26 26 24 25 25   GLUCOSE 304* 69* 168* 96 135* 97  BUN 42* 39* 40* 40* 51* 59*  CREATININE 2.73* 2.58* 2.27* 2.40* 2.82* 2.62*  CALCIUM  8.9 8.6* 8.4* 8.4* 8.4* 8.6*  PHOS 3.8  --   --   --   --   --  GFR: Estimated Creatinine Clearance: 31.1 mL/min (A) (by C-G formula based on SCr of 2.62 mg/dL (H)). Liver Function Tests: Recent Labs  Lab 08/12/23 1534 08/13/23 0836  AST 32  --   ALT 15  --   ALKPHOS 73  --   BILITOT 0.8  --   PROT 6.3*  --   ALBUMIN 3.3* 2.5*   No results for input(s): LIPASE, AMYLASE in the last 168  hours. No results for input(s): AMMONIA in the last 168 hours. Coagulation Profile: No results for input(s): INR, PROTIME in the last 168 hours. Cardiac Enzymes: No results for input(s): CKTOTAL, CKMB, CKMBINDEX, TROPONINI in the last 168 hours. BNP (last 3 results) No results for input(s): PROBNP in the last 8760 hours. HbA1C: No results for input(s): HGBA1C in the last 72 hours. CBG: Recent Labs  Lab 08/17/23 0735 08/17/23 1137 08/17/23 1657 08/17/23 2127 08/18/23 0749  GLUCAP 146* 123* 169* 168* 117*   Lipid Profile: No results for input(s): CHOL, HDL, LDLCALC, TRIG, CHOLHDL, LDLDIRECT in the last 72 hours. Thyroid Function Tests: No results for input(s): TSH, T4TOTAL, FREET4, T3FREE, THYROIDAB in the last 72 hours. Anemia Panel: No results for input(s): VITAMINB12, FOLATE, FERRITIN, TIBC, IRON, RETICCTPCT in the last 72 hours. Most Recent Urinalysis On File:     Component Value Date/Time   COLORURINE YELLOW (A) 08/13/2023 1030   APPEARANCEUR CLOUDY (A) 08/13/2023 1030   LABSPEC 1.017 08/13/2023 1030   PHURINE 5.0 08/13/2023 1030   GLUCOSEU >=500 (A) 08/13/2023 1030   HGBUR SMALL (A) 08/13/2023 1030   BILIRUBINUR NEGATIVE 08/13/2023 1030   KETONESUR NEGATIVE 08/13/2023 1030   PROTEINUR >=300 (A) 08/13/2023 1030   NITRITE NEGATIVE 08/13/2023 1030   LEUKOCYTESUR NEGATIVE 08/13/2023 1030   Sepsis Labs: @LABRCNTIP (procalcitonin:4,lacticidven:4) Microbiology: Recent Results (from the past 240 hours)  Blood culture (routine x 2)     Status: None (Preliminary result)   Collection Time: 08/12/23  3:34 PM   Specimen: BLOOD  Result Value Ref Range Status   Specimen Description BLOOD BLOOD RIGHT ARM  Final   Special Requests   Final    BOTTLES DRAWN AEROBIC AND ANAEROBIC Blood Culture results may not be optimal due to an inadequate volume of blood received in culture bottles   Culture   Final    NO GROWTH 4  DAYS Performed at [facility], [address] [place], [place] [address]    Report Status PENDING  Incomplete  Blood culture (routine x 2)     Status: None (Preliminary result)   Collection Time: 08/12/23  5:10 PM   Specimen: BLOOD  Result Value Ref Range Status   Specimen Description BLOOD BLOOD RIGHT HAND  Final   Special Requests   Final    BOTTLES DRAWN AEROBIC AND ANAEROBIC Blood Culture adequate volume   Culture   Final    NO GROWTH 4 DAYS Performed at [facility], [address], [place], [place] [address]    Report Status PENDING  Incomplete  Aerobic Culture w Gram Stain (superficial specimen)     Status: None   Collection Time: 08/14/23  1:37 PM   Specimen: Wound  Result Value Ref Range Status   Specimen Description   Final    WOUND Performed at [facility], [address] [place], [place] [address]    Special Requests   Final     LEFT FOOT Performed at [facility], [address], [place], [place] [address]    Gram Stain  Final    RARE WBC PRESENT, PREDOMINANTLY PMN ABUNDANT GRAM POSITIVE COCCI ABUNDANT GRAM NEGATIVE RODS FEW GRAM POSITIVE RODS Performed at [facility], [address], [place], [place] [address]    Culture   Final    MODERATE METHICILLIN RESISTANT STAPHYLOCOCCUS AUREUS   Report Status 08/17/2023 FINAL  Final   Organism ID, Bacteria METHICILLIN RESISTANT STAPHYLOCOCCUS AUREUS  Final      Susceptibility   Methicillin resistant staphylococcus aureus - MIC*    CIPROFLOXACIN  >=8 RESISTANT Resistant     ERYTHROMYCIN >=8 RESISTANT Resistant     GENTAMICIN  <=0.5 SENSITIVE Sensitive     OXACILLIN >=4 RESISTANT Resistant     TETRACYCLINE <=1 SENSITIVE Sensitive     VANCOMYCIN  1 SENSITIVE Sensitive     TRIMETH/SULFA <=10 SENSITIVE Sensitive     CLINDAMYCIN  >=8 RESISTANT Resistant     RIFAMPIN <=0.5 SENSITIVE Sensitive     Inducible Clindamycin  NEGATIVE Sensitive     LINEZOLID  1 SENSITIVE Sensitive      * MODERATE METHICILLIN RESISTANT STAPHYLOCOCCUS AUREUS      Radiology Studies last 3 days: No results found.        Taiten Brawn, DO [facility] 08/18/2023, 8:22 AM    Dictation software may have been used to generate the above note. Typos may occur and escape review in typed/dictated notes. Please contact Dr Marsa directly for clarity if needed.  Staff may message me via secure chat in Epic  but this may not receive an immediate response,  please page me for urgent matters!  If 7PM-7AM, please contact night coverage [email or web address]

## 2023-08-17 NOTE — Plan of Care (Signed)
  Problem: Coping: Goal: Ability to adjust to condition or change in health will improve Outcome: Progressing   Problem: Clinical Measurements: Goal: Diagnostic test results will improve Outcome: Progressing   Problem: Activity: Goal: Risk for activity intolerance will decrease Outcome: Progressing   Problem: Nutrition: Goal: Adequate nutrition will be maintained Outcome: Progressing   Problem: Pain Managment: Goal: General experience of comfort will improve and/or be controlled Outcome: Progressing

## 2023-08-18 DIAGNOSIS — L03119 Cellulitis of unspecified part of limb: Secondary | ICD-10-CM | POA: Diagnosis not present

## 2023-08-18 DIAGNOSIS — L02619 Cutaneous abscess of unspecified foot: Secondary | ICD-10-CM | POA: Diagnosis not present

## 2023-08-18 LAB — CULTURE, BLOOD (ROUTINE X 2)
Culture: NO GROWTH
Culture: NO GROWTH
Special Requests: ADEQUATE

## 2023-08-18 LAB — BASIC METABOLIC PANEL WITH GFR
Anion gap: 11 (ref 5–15)
BUN: 59 mg/dL — ABNORMAL HIGH (ref 6–20)
CO2: 25 mmol/L (ref 22–32)
Calcium: 8.6 mg/dL — ABNORMAL LOW (ref 8.9–10.3)
Chloride: 101 mmol/L (ref 98–111)
Creatinine, Ser: 2.62 mg/dL — ABNORMAL HIGH (ref 0.61–1.24)
GFR, Estimated: 27 mL/min — ABNORMAL LOW (ref >60)
Glucose, Bld: 97 mg/dL (ref 70–99)
Potassium: 4.2 mmol/L (ref 3.5–5.1)
Sodium: 137 mmol/L (ref 135–145)

## 2023-08-18 LAB — GLUCOSE, CAPILLARY
Glucose-Capillary: 117 mg/dL — ABNORMAL HIGH (ref 70–99)
Glucose-Capillary: 190 mg/dL — ABNORMAL HIGH (ref 70–99)
Glucose-Capillary: 188 mg/dL — ABNORMAL HIGH (ref 70–99)
Glucose-Capillary: 205 mg/dL — ABNORMAL HIGH (ref 70–99)

## 2023-08-18 NOTE — Plan of Care (Signed)
  Problem: Education: Goal: Ability to describe self-care measures that may prevent or decrease complications (Diabetes Survival Skills Education) will improve Outcome: Progressing   Problem: Coping: Goal: Ability to adjust to condition or change in health will improve Outcome: Progressing

## 2023-08-18 NOTE — Progress Notes (Signed)
 PROGRESS NOTE    Jason Jennings   FMW:[id number] DOB: 1965-11-17  DOA: 08/12/2023 Date of Service: 08/18/23 which is hospital day 6  PCP: Valere Ozell Shove, MD    Hospital course / significant events:   HPI: Jason Jennings is a 58 y.o. 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  He was in wound care for approximately 3 months following debridement and finally achieved wound closure in March 2025.  Presented to ED 08/12/23 w/ 1 day history of increasing pain and swelling and erythema in his lower leg, significant pain at the ball of his foot under the callus that he describes as feeling like an abscess under there, also w/ new nausea and vomiting   07/05: admitted to hospitalist w/ sepsis d/t cellulitis and superficial abscess plantar surface L foot. Also AKI. MRI left foot without contrast: Ulceration plantar aspect fourth metatarsal head without obvious large abscess and reactive marrow edema in the fourth proximal phalanx without evidence of osteomyelitis.  07/06: continued on IV abx  07/07: podiatry consult - bedside I&D, ok to transition to po abx when cultures available 07/08-07/09: still significant erythema/tenderness, continue abx  07/10: culture (+)MRSA, erythema/swelling a bit worse today despite being on the po linezolid  so will go w/ IV tx  07/11: Cellulitis improving, will stay 1 more day on IV antibiotics     Consultants:  podiatry  Procedures/Surgeries: none      ASSESSMENT & PLAN:   Sepsis secondary to cellulitis of the left leg-present on admission (+)MRSA  Superficial abscess of plantar surface of left foot s/p debridement at bedside by podiatry on 08/14/23 Patient met sepsis criteria with WBC 12.1, pulse 110 in the setting of cellulitis. MRI did not show evidence of osteomyelitis Normal ABIs in January 2025 Abscess culture (+)MRSA,  suspect same w/ cellulitis, anticipate 14 days po therapy on discharge, but will restart IV linezolid  for now given worsening erythema/pain on exam despite po linezold -uncertain if improvement due to timing or if there may be some improvement in tissue penetration with IV (IV and p.o. linezolid  had similar bioavailability so should not have made much difference) at any rate we will do 1 more day IV antibiotics and if still continuing to improve we will switch back to p.o. and anticipate discharge likely from tomorrow Wound care: daily dressing change to the left foot with gentamicin  ointment and cover wound with Hydrofera Blue or Xeroform 4 x 4 gauze Kerlix and Ace roll WB status: Weightbearing as tolerated in postop shoe follow-up with [facility] upon discharge.  He had trouble following up with podiatry office due to insurance issues. Podiatry team has s/o, reengage prn   Acute renal failure / AKI superimposed on stage 3b chronic kidney disease  Baseline creatinine is around 2.3 Renal function improved to baseline Continue holding ARB Avoid nephrotoxic agents Monitor BMP Encourage po fluids may need to give IV if not improving Low threshold for nephrology consult    Hypokalemia Replace as needed Monitor BMP   Type 2 diabetes mellitus with hyperlipidemia (HCC) Continue sliding scale   HTN (hypertension) Continue holding losartan  d/t renal function  Calcium  channel blocker, beta blocker    Neuropathy, diabetic  Continue gabapentin    H/O colon cancer, stage II Treated at [facility]  Status post neoadjuvant chemoradiation with low anterior resection in 2016. Recurrence treated with chemoradiation in 2020 and  further surgery Follow outpatient       Class 1 obesity based on BMI: Body mass index is 30.46 kg/m.[name] Significantly low or high BMI is associated with higher medical risk.  Underweight - under 18  overweight - 25 to 29 obese - 30 or more Class 1 obesity: BMI  of 30.0 to 34 Class 2 obesity: BMI of 35.0 to 39 Class 3 obesity: BMI of 40.0 to 49 Super Morbid Obesity: BMI 50-59 Super-super Morbid Obesity: BMI 60+ Healthy nutrition and physical activity advised as adjunct to other disease management and risk reduction treatments    DVT prophylaxis: heparin  IV fluids: no continuous IV fluids  Nutrition: carb modified Central lines / other devices: none  Code Status: FULL CODE ACP documentation reviewed:  none on file in [facility]  TOC needs: TBD Medical barriers to dispo: IV abx. Expected medical readiness for discharge pending clinical improvement hopefully tomorrow             Subjective / Brief ROS:  Patient reports warmth and pain LLE is significantly improved compared to yesterday Denies CP/SOB.  Pain controlled.  Denies new weakness.  Tolerating diet.  Reports no concerns w/ urination/defecation.   Family Communication: none at this time, spoke yesterday evening but daughter who was at bedside    Objective Findings:  Vitals:   08/17/23 2122 08/18/23 0408 08/18/23 0748 08/18/23 1500  BP: (!) 147/70 136/70 (!) 148/78 (!) 159/79  Pulse: 65 64 66 65  Resp:  16 17 17   Temp:  98.1 F (36.7 C) 98.9 F (37.2 C) 98 F (36.7 C)  TempSrc:  Oral  Oral  SpO2:  97% 98% 100%  Weight:      Height:        Intake/Output Summary (Last 24 hours) at 08/18/2023 1822 Last data filed at 08/18/2023 1439 Gross per 24 hour  Intake 1076.07 ml  Output --  Net 1076.07 ml   Filed Weights   08/12/23 1849 08/12/23 2022  Weight: 80.3 kg 85.6 kg    Examination:  Physical Exam Constitutional:      General: He is not in acute distress. Cardiovascular:     Rate and Rhythm: Normal rate and regular rhythm.  Pulmonary:     Effort: Pulmonary effort is normal.     Breath sounds: Normal breath sounds.  Abdominal:     Palpations: Abdomen is soft.  Musculoskeletal:        General: Swelling (LLE) and tenderness (LLE) present.     Right  lower leg: No edema.     Left lower leg: Edema present.  Skin:    General: Skin is warm.     Findings: Erythema (LLE) present.  Neurological:     General: No focal deficit present.     Mental Status: He is alert and oriented to person, place, and time.  Psychiatric:        Mood and Affect: Mood normal.        Behavior: Behavior normal.          Scheduled Medications:   amLODipine   5 mg Oral Daily   vitamin C   500 mg Oral BID   gabapentin   100 mg Oral TID   gentamicin  ointment   Topical Daily   heparin   5,000 Units Subcutaneous Q8H   insulin  aspart  0-9 Units Subcutaneous TID WC   insulin  aspart protamine- aspart  30 Units Subcutaneous BID WC   insulin  glargine-yfgn  10 Units Subcutaneous Daily   metoCLOPramide  (REGLAN ) injection  10 mg Intravenous Q6H   metoprolol  tartrate  25 mg Oral BID   multivitamin with minerals  1 tablet Oral Daily   nutrition supplement (JUVEN)  1 packet Oral BID BM   Ensure Max Protein  11 oz Oral QHS   rosuvastatin   20 mg Oral Daily   tamsulosin   0.4 mg Oral Daily   zinc  sulfate (50mg  elemental zinc )  220 mg Oral Daily    Continuous Infusions:  linezolid  (ZYVOX ) IV Stopped (08/18/23 1117)   ondansetron  (ZOFRAN ) IV      PRN Medications:  acetaminophen  **OR** acetaminophen , fentaNYL  (SUBLIMAZE ) injection, methocarbamol , metoprolol  tartrate, ondansetron  **OR** ondansetron  (ZOFRAN ) IV, oxyCODONE , polyethylene glycol  Antimicrobials from admission:  Anti-infectives (From admission, onward)    Start     Dose/Rate Route Frequency Ordered Stop   08/17/23 2200  linezolid  (ZYVOX ) IVPB 600 mg        600 mg 300 mL/hr over 60 Minutes Intravenous Every 12 hours 08/17/23 1423     08/17/23 1000  Ampicillin -Sulbactam (UNASYN ) 3 g in sodium chloride  0.9 % 100 mL IVPB  Status:  Discontinued        3 g 200 mL/hr over 30 Minutes Intravenous Every 12 hours 08/17/23 0357 08/17/23 1236   08/15/23 2200  linezolid  (ZYVOX ) tablet 600 mg  Status:  Discontinued         600 mg Oral Every 12 hours 08/15/23 1157 08/17/23 1236   08/14/23 1600  Ampicillin -Sulbactam (UNASYN ) 3 g in sodium chloride  0.9 % 100 mL IVPB  Status:  Discontinued        3 g 200 mL/hr over 30 Minutes Intravenous Every 6 hours 08/14/23 1528 08/17/23 0357   08/13/23 2000  linezolid  (ZYVOX ) IVPB 600 mg  Status:  Discontinued        600 mg 300 mL/hr over 60 Minutes Intravenous Every 12 hours 08/13/23 1407 08/15/23 1157   08/12/23 2000  vancomycin  (VANCOREADY) IVPB 1750 mg/350 mL        1,750 mg 175 mL/hr over 120 Minutes Intravenous  Once 08/12/23 1919 08/12/23 2333   08/12/23 1920  vancomycin  variable dose per unstable renal function (pharmacist dosing)  Status:  Discontinued         Does not apply See admin instructions 08/12/23 1920 08/13/23 1407   08/12/23 1845  vancomycin  (VANCOCIN ) IVPB 1000 mg/200 mL premix  Status:  Discontinued        1,000 mg 200 mL/hr over 60 Minutes Intravenous  Once 08/12/23 1831 08/12/23 1919   08/12/23 1615  cefTRIAXone  (ROCEPHIN ) 2 g in sodium chloride  0.9 % 100 mL IVPB  Status:  Discontinued        2 g 200 mL/hr over 30 Minutes Intravenous  Once 08/12/23 1610 08/12/23 1612   08/12/23 1615  cefTRIAXone  (ROCEPHIN ) 2 g in sodium chloride  0.9 % 100 mL IVPB        2 g 200 mL/hr over 30 Minutes Intravenous Once 08/12/23 1613 08/12/23 1748           Data Reviewed:  I have personally reviewed the following...  CBC: Recent Labs  Lab 08/12/23 1534 08/14/23 0522 08/15/23 0532 08/16/23 0529  WBC 12.1* 8.4 7.4 6.8  NEUTROABS 10.6* 7.2 5.9 5.3  HGB 12.0* 10.4* 10.4* 10.5*  HCT 34.3* 30.7* 30.9* 31.2*  MCV 83.5 87.0 86.6 85.7  PLT 172 144* 146* 171   Basic Metabolic Panel: Recent Labs  Lab 08/13/23 0836 08/14/23 0522 08/15/23 0532 08/16/23 0529 08/17/23 0240 08/18/23 0527  NA 135 138  136 138 139 137  K 3.9 3.3* 3.6 3.6 4.0 4.2  CL 103 105 100 103 102 101  CO2 24 26 26 24 25 25   GLUCOSE 304* 69* 168* 96 135* 97  BUN 42* 39* 40* 40* 51*  59*  CREATININE 2.73* 2.58* 2.27* 2.40* 2.82* 2.62*  CALCIUM  8.9 8.6* 8.4* 8.4* 8.4* 8.6*  PHOS 3.8  --   --   --   --   --    GFR: Estimated Creatinine Clearance: 31.1 mL/min (A) (by C-G formula based on SCr of 2.62 mg/dL (H)). Liver Function Tests: Recent Labs  Lab 08/12/23 1534 08/13/23 0836  AST 32  --   ALT 15  --   ALKPHOS 73  --   BILITOT 0.8  --   PROT 6.3*  --   ALBUMIN 3.3* 2.5*   No results for input(s): LIPASE, AMYLASE in the last 168 hours. No results for input(s): AMMONIA in the last 168 hours. Coagulation Profile: No results for input(s): INR, PROTIME in the last 168 hours. Cardiac Enzymes: No results for input(s): CKTOTAL, CKMB, CKMBINDEX, TROPONINI in the last 168 hours. BNP (last 3 results) No results for input(s): PROBNP in the last 8760 hours. HbA1C: No results for input(s): HGBA1C in the last 72 hours. CBG: Recent Labs  Lab 08/17/23 1657 08/17/23 2127 08/18/23 0749 08/18/23 1134 08/18/23 1715  GLUCAP 169* 168* 117* 190* 188*   Lipid Profile: No results for input(s): CHOL, HDL, LDLCALC, TRIG, CHOLHDL, LDLDIRECT in the last 72 hours. Thyroid Function Tests: No results for input(s): TSH, T4TOTAL, FREET4, T3FREE, THYROIDAB in the last 72 hours. Anemia Panel: No results for input(s): VITAMINB12, FOLATE, FERRITIN, TIBC, IRON, RETICCTPCT in the last 72 hours. Most Recent Urinalysis On File:     Component Value Date/Time   COLORURINE YELLOW (A) 08/13/2023 1030   APPEARANCEUR CLOUDY (A) 08/13/2023 1030   LABSPEC 1.017 08/13/2023 1030   PHURINE 5.0 08/13/2023 1030   GLUCOSEU >=500 (A) 08/13/2023 1030   HGBUR SMALL (A) 08/13/2023 1030   BILIRUBINUR NEGATIVE 08/13/2023 1030   KETONESUR NEGATIVE 08/13/2023 1030   PROTEINUR >=300 (A) 08/13/2023 1030   NITRITE NEGATIVE 08/13/2023 1030   LEUKOCYTESUR NEGATIVE 08/13/2023 1030   Sepsis  Labs: @LABRCNTIP (procalcitonin:4,lacticidven:4) Microbiology: Recent Results (from the past 240 hours)  Blood culture (routine x 2)     Status: None   Collection Time: 08/12/23  3:34 PM   Specimen: BLOOD  Result Value Ref Range Status   Specimen Description   Final    BLOOD BLOOD RIGHT ARM Performed at [facility], [address] [place], [place] [address]    Special Requests   Final    BOTTLES DRAWN AEROBIC AND ANAEROBIC Blood Culture results may not be optimal due to an inadequate volume of blood received in culture bottles Performed at [facility], [address], [place], [place] [address]    Culture   Final    NO GROWTH 6 DAYS Performed at [facility], [address], [place], [place] [address]    Report Status 08/18/2023 FINAL  Final  Blood culture (routine x 2)     Status: None   Collection Time: 08/12/23  5:10 PM   Specimen: BLOOD  Result Value Ref Range Status   Specimen Description   Final    BLOOD BLOOD RIGHT HAND Performed at [facility], [address], [place], [place] [address]    Special Requests   Final    BOTTLES DRAWN AEROBIC AND  ANAEROBIC Blood Culture adequate volume Performed at [facility], [address] [place], [place] [address]    Culture   Final    NO GROWTH 6 DAYS Performed at [facility], [address] [place], [place] [address]    Report Status 08/18/2023 FINAL  Final  Aerobic Culture w Gram Stain (superficial specimen)     Status: None   Collection Time: 08/14/23  1:37 PM   Specimen: Wound  Result Value Ref Range Status   Specimen Description   Final    WOUND Performed at [facility], [address] [place], [place] [address]    Special Requests   Final     LEFT FOOT Performed at [facility], [address] [place], [place] [address]    Gram Stain   Final    RARE WBC PRESENT, PREDOMINANTLY PMN ABUNDANT GRAM POSITIVE COCCI ABUNDANT GRAM NEGATIVE  RODS FEW GRAM POSITIVE RODS Performed at [facility], [address] [place], [place] [address]    Culture   Final    MODERATE METHICILLIN RESISTANT STAPHYLOCOCCUS AUREUS   Report Status 08/17/2023 FINAL  Final   Organism ID, Bacteria METHICILLIN RESISTANT STAPHYLOCOCCUS AUREUS  Final      Susceptibility   Methicillin resistant staphylococcus aureus - MIC*    CIPROFLOXACIN  >=8 RESISTANT Resistant     ERYTHROMYCIN >=8 RESISTANT Resistant     GENTAMICIN  <=0.5 SENSITIVE Sensitive     OXACILLIN >=4 RESISTANT Resistant     TETRACYCLINE <=1 SENSITIVE Sensitive     VANCOMYCIN  1 SENSITIVE Sensitive     TRIMETH/SULFA <=10 SENSITIVE Sensitive     CLINDAMYCIN  >=8 RESISTANT Resistant     RIFAMPIN <=0.5 SENSITIVE Sensitive     Inducible Clindamycin  NEGATIVE Sensitive     LINEZOLID  1 SENSITIVE Sensitive     * MODERATE METHICILLIN RESISTANT STAPHYLOCOCCUS AUREUS      Radiology Studies last 3 days: No results found.        Murtaza Shell, DO [facility] 08/18/2023, 6:22 PM    Dictation software may have been used to generate the above note. Typos may occur and escape review in typed/dictated notes. Please contact Dr Marsa directly for clarity if needed.  Staff may message me via secure chat in Epic  but this may not receive an immediate response,  please page me for urgent matters!  If 7PM-7AM, please contact night coverage [email or web address]

## 2023-08-19 DIAGNOSIS — L03119 Cellulitis of unspecified part of limb: Secondary | ICD-10-CM | POA: Diagnosis not present

## 2023-08-19 DIAGNOSIS — L02619 Cutaneous abscess of unspecified foot: Secondary | ICD-10-CM | POA: Diagnosis not present

## 2023-08-19 LAB — CBC
HCT: 32 % — ABNORMAL LOW (ref 39.0–52.0)
Hemoglobin: 10.3 g/dL — ABNORMAL LOW (ref 13.0–17.0)
MCH: 28.3 pg (ref 26.0–34.0)
MCHC: 32.2 g/dL (ref 30.0–36.0)
MCV: 87.9 fL (ref 80.0–100.0)
Platelets: 214 K/uL (ref 150–400)
RBC: 3.64 MIL/uL — ABNORMAL LOW (ref 4.22–5.81)
RDW: 13.1 % (ref 11.5–15.5)
WBC: 6.7 K/uL (ref 4.0–10.5)
nRBC: 0 % (ref 0.0–0.2)

## 2023-08-19 LAB — BASIC METABOLIC PANEL WITH GFR
Anion gap: 8 (ref 5–15)
BUN: 68 mg/dL — ABNORMAL HIGH (ref 6–20)
CO2: 26 mmol/L (ref 22–32)
Calcium: 8.7 mg/dL — ABNORMAL LOW (ref 8.9–10.3)
Chloride: 104 mmol/L (ref 98–111)
Creatinine, Ser: 2.76 mg/dL — ABNORMAL HIGH (ref 0.61–1.24)
GFR, Estimated: 26 mL/min — ABNORMAL LOW (ref >60)
Glucose, Bld: 171 mg/dL — ABNORMAL HIGH (ref 70–99)
Potassium: 4.6 mmol/L (ref 3.5–5.1)
Sodium: 138 mmol/L (ref 135–145)

## 2023-08-19 LAB — GLUCOSE, CAPILLARY
Glucose-Capillary: 166 mg/dL — ABNORMAL HIGH (ref 70–99)
Glucose-Capillary: 167 mg/dL — ABNORMAL HIGH (ref 70–99)
Glucose-Capillary: 256 mg/dL — ABNORMAL HIGH (ref 70–99)
Glucose-Capillary: 118 mg/dL — ABNORMAL HIGH (ref 70–99)

## 2023-08-19 NOTE — Plan of Care (Signed)

## 2023-08-19 NOTE — Plan of Care (Signed)
   Problem: Education: Goal: Ability to describe self-care measures that may prevent or decrease complications (Diabetes Survival Skills Education) will improve Outcome: Progressing

## 2023-08-19 NOTE — Progress Notes (Signed)
 PROGRESS NOTE    Jason Jennings   FMW:[id number] DOB: [birth year]  DOA: 08/12/2023 Date of Service: 08/19/23 which is hospital day 7  PCP: Valere Ozell Shove, MD    Hospital course / significant events:   HPI: Jason Jennings is a 58 y.o. 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  He was in wound care for approximately 3 months following debridement and finally achieved wound closure in March 2025.  Presented to ED 08/12/23 w/ 1 day history of increasing pain and swelling and erythema in his lower leg, significant pain at the ball of his foot under the callus that he describes as feeling like an abscess under there, also w/ new nausea and vomiting   07/05: admitted to hospitalist w/ sepsis d/t cellulitis and superficial abscess plantar surface L foot. Also AKI. MRI left foot without contrast: Ulceration plantar aspect fourth metatarsal head without obvious large abscess and reactive marrow edema in the fourth proximal phalanx without evidence of osteomyelitis.  07/06: continued on IV abx  07/07: podiatry consult - bedside I&D, ok to transition to po abx when cultures available 07/08-07/09: still significant erythema/tenderness, continue abx  07/10: culture (+)MRSA, erythema/swelling a bit worse today despite being on the po linezolid  so will go w/ IV tx  07/11: Cellulitis improving, will stay 1-2 more day on IV antibiotics 07/12: pt feels IV abx are helping and requests one more day on this, he is concerned about renal function, ok for another day given erythema about same as yesterday / high risk      Consultants:  podiatry  Procedures/Surgeries: none      ASSESSMENT & PLAN:   Sepsis secondary to cellulitis of the left leg-present on admission (+)MRSA  Superficial abscess of plantar surface of left foot s/p debridement at bedside by podiatry on  08/14/23 Patient met sepsis criteria with WBC 12.1, pulse 110 in the setting of cellulitis. MRI did not show evidence of osteomyelitis Normal ABIs in January 2025 Abscess culture (+)MRSA, suspect same w/ cellulitis, anticipate 14 days po therapy on discharge, but will restart IV linezolid  for now given worsening erythema/pain on exam despite po linezold -uncertain if improvement due to timing or if there may be some improvement in tissue penetration with IV (IV and p.o. linezolid  had similar bioavailability so should not have made much difference) at any rate we will do 1 more day IV antibiotics and if still continuing to improve we will switch back to p.o. and anticipate discharge likely from tomorrow Wound care: daily dressing change to the left foot with gentamicin  ointment and cover wound with Hydrofera Blue or Xeroform 4 x 4 gauze Kerlix and Ace roll WB status: Weightbearing as tolerated in postop shoe follow-up with [facility] upon discharge.  He had trouble following up with podiatry office due to insurance issues. Podiatry team has s/o, reengage prn   Acute renal failure / AKI superimposed on stage 3b chronic kidney disease  Baseline creatinine is around 2.3 Renal function improved to baseline Continue holding ARB Avoid nephrotoxic agents Monitor BMP Encourage po fluids may need to give IV if not improving Low threshold for nephrology consult    Hypokalemia Replace as needed Monitor BMP   Type 2 diabetes mellitus with hyperlipidemia (HCC) Continue sliding scale   HTN (hypertension) Continue holding losartan  d/t renal function  Calcium  channel blocker, beta blocker  Neuropathy, diabetic  Continue gabapentin    H/O colon cancer, stage II Treated at [facility]  Status post neoadjuvant chemoradiation with low anterior resection in 2016. Recurrence treated with chemoradiation in 2020 and further surgery Follow outpatient       Class 1 obesity based on BMI:  Body mass index is 30.46 kg/m.[name] Significantly low or high BMI is associated with higher medical risk.  Underweight - under 18  overweight - 25 to 29 obese - 30 or more Class 1 obesity: BMI of 30.0 to 34 Class 2 obesity: BMI of 35.0 to 39 Class 3 obesity: BMI of 40.0 to 49 Super Morbid Obesity: BMI 50-59 Super-super Morbid Obesity: BMI 60+ Healthy nutrition and physical activity advised as adjunct to other disease management and risk reduction treatments    DVT prophylaxis: heparin  IV fluids: no continuous IV fluids  Nutrition: carb modified Central lines / other devices: none  Code Status: FULL CODE ACP documentation reviewed:  none on file in [facility]  TOC needs: TBD Medical barriers to dispo: IV abx. Expected medical readiness for discharge pending clinical improvement hopefully tomorrow             Subjective / Brief ROS:  Patient reports warmth and pain LLE is significantly improved compared to few days ago but not much better from yesterday  Denies CP/SOB.  Pain controlled.  Denies new weakness.  Tolerating diet.    Family Communication: none at this time,    Objective Findings:  Vitals:   08/18/23 2054 08/19/23 0347 08/19/23 0722 08/19/23 1526  BP: (!) 151/74 134/63 (!) 140/70 (!) 160/78  Pulse: 70 60 (!) 59 66  Resp: 18 18 17 18   Temp: 98.6 F (37 C) (!) 97.5 F (36.4 C) 97.9 F (36.6 C)   TempSrc:   Oral   SpO2: 99% 95% 98% 98%  Weight:      Height:        Intake/Output Summary (Last 24 hours) at 08/19/2023 1530 Last data filed at 08/19/2023 1426 Gross per 24 hour  Intake 720 ml  Output 400 ml  Net 320 ml   Filed Weights   08/12/23 1849 08/12/23 2022  Weight: 80.3 kg 85.6 kg    Examination:  Physical Exam Constitutional:      General: He is not in acute distress. Cardiovascular:     Rate and Rhythm: Normal rate and regular rhythm.  Pulmonary:     Effort: Pulmonary effort is normal.     Breath sounds: Normal breath sounds.   Abdominal:     Palpations: Abdomen is soft.  Musculoskeletal:        General: Swelling (LLE) and tenderness (LLE) present.     Right lower leg: No edema.     Left lower leg: Edema present.  Skin:    General: Skin is warm.     Findings: Erythema (LLE) present.  Neurological:     General: No focal deficit present.     Mental Status: He is alert and oriented to person, place, and time.  Psychiatric:        Mood and Affect: Mood normal.        Behavior: Behavior normal.          Scheduled Medications:   amLODipine   5 mg Oral Daily   vitamin C   500 mg Oral BID   gabapentin   100 mg Oral TID   gentamicin  ointment   Topical Daily   heparin   5,000 Units Subcutaneous Q8H   insulin  aspart  0-9  Units Subcutaneous TID WC   insulin  aspart protamine- aspart  30 Units Subcutaneous BID WC   insulin  glargine-yfgn  10 Units Subcutaneous Daily   metoCLOPramide  (REGLAN ) injection  10 mg Intravenous Q6H   metoprolol  tartrate  25 mg Oral BID   multivitamin with minerals  1 tablet Oral Daily   nutrition supplement (JUVEN)  1 packet Oral BID BM   Ensure Max Protein  11 oz Oral QHS   rosuvastatin   20 mg Oral Daily   tamsulosin   0.4 mg Oral Daily   zinc  sulfate (50mg  elemental zinc )  220 mg Oral Daily    Continuous Infusions:  linezolid  (ZYVOX ) IV 600 mg (08/19/23 0858)   ondansetron  (ZOFRAN ) IV      PRN Medications:  acetaminophen  **OR** acetaminophen , fentaNYL  (SUBLIMAZE ) injection, methocarbamol , metoprolol  tartrate, ondansetron  **OR** ondansetron  (ZOFRAN ) IV, oxyCODONE , polyethylene glycol  Antimicrobials from admission:  Anti-infectives (From admission, onward)    Start     Dose/Rate Route Frequency Ordered Stop   08/17/23 2200  linezolid  (ZYVOX ) IVPB 600 mg        600 mg 300 mL/hr over 60 Minutes Intravenous Every 12 hours 08/17/23 1423     08/17/23 1000  Ampicillin -Sulbactam (UNASYN ) 3 g in sodium chloride  0.9 % 100 mL IVPB  Status:  Discontinued        3 g 200 mL/hr over 30  Minutes Intravenous Every 12 hours 08/17/23 0357 08/17/23 1236   08/15/23 2200  linezolid  (ZYVOX ) tablet 600 mg  Status:  Discontinued        600 mg Oral Every 12 hours 08/15/23 1157 08/17/23 1236   08/14/23 1600  Ampicillin -Sulbactam (UNASYN ) 3 g in sodium chloride  0.9 % 100 mL IVPB  Status:  Discontinued        3 g 200 mL/hr over 30 Minutes Intravenous Every 6 hours 08/14/23 1528 08/17/23 0357   08/13/23 2000  linezolid  (ZYVOX ) IVPB 600 mg  Status:  Discontinued        600 mg 300 mL/hr over 60 Minutes Intravenous Every 12 hours 08/13/23 1407 08/15/23 1157   08/12/23 2000  vancomycin  (VANCOREADY) IVPB 1750 mg/350 mL        1,750 mg 175 mL/hr over 120 Minutes Intravenous  Once 08/12/23 1919 08/12/23 2333   08/12/23 1920  vancomycin  variable dose per unstable renal function (pharmacist dosing)  Status:  Discontinued         Does not apply See admin instructions 08/12/23 1920 08/13/23 1407   08/12/23 1845  vancomycin  (VANCOCIN ) IVPB 1000 mg/200 mL premix  Status:  Discontinued        1,000 mg 200 mL/hr over 60 Minutes Intravenous  Once 08/12/23 1831 08/12/23 1919   08/12/23 1615  cefTRIAXone  (ROCEPHIN ) 2 g in sodium chloride  0.9 % 100 mL IVPB  Status:  Discontinued        2 g 200 mL/hr over 30 Minutes Intravenous  Once 08/12/23 1610 08/12/23 1612   08/12/23 1615  cefTRIAXone  (ROCEPHIN ) 2 g in sodium chloride  0.9 % 100 mL IVPB        2 g 200 mL/hr over 30 Minutes Intravenous Once 08/12/23 1613 08/12/23 1748           Data Reviewed:  I have personally reviewed the following...  CBC: Recent Labs  Lab 08/12/23 1534 08/14/23 0522 08/15/23 0532 08/16/23 0529 08/19/23 0726  WBC 12.1* 8.4 7.4 6.8 6.7  NEUTROABS 10.6* 7.2 5.9 5.3  --   HGB 12.0* 10.4* 10.4* 10.5* 10.3*  HCT 34.3* 30.7*  30.9* 31.2* 32.0*  MCV 83.5 87.0 86.6 85.7 87.9  PLT 172 144* 146* 171 214   Basic Metabolic Panel: Recent Labs  Lab 08/13/23 0836 08/14/23 0522 08/15/23 0532 08/16/23 0529 08/17/23 0240  08/18/23 0527 08/19/23 0726  NA 135   < > 136 138 139 137 138  K 3.9   < > 3.6 3.6 4.0 4.2 4.6  CL 103   < > 100 103 102 101 104  CO2 24   < > 26 24 25 25 26   GLUCOSE 304*   < > 168* 96 135* 97 171*  BUN 42*   < > 40* 40* 51* 59* 68*  CREATININE 2.73*   < > 2.27* 2.40* 2.82* 2.62* 2.76*  CALCIUM  8.9   < > 8.4* 8.4* 8.4* 8.6* 8.7*  PHOS 3.8  --   --   --   --   --   --    < > = values in this interval not displayed.   GFR: Estimated Creatinine Clearance: 29.6 mL/min (A) (by C-G formula based on SCr of 2.76 mg/dL (H)). Liver Function Tests: Recent Labs  Lab 08/12/23 1534 08/13/23 0836  AST 32  --   ALT 15  --   ALKPHOS 73  --   BILITOT 0.8  --   PROT 6.3*  --   ALBUMIN 3.3* 2.5*   No results for input(s): LIPASE, AMYLASE in the last 168 hours. No results for input(s): AMMONIA in the last 168 hours. Coagulation Profile: No results for input(s): INR, PROTIME in the last 168 hours. Cardiac Enzymes: No results for input(s): CKTOTAL, CKMB, CKMBINDEX, TROPONINI in the last 168 hours. BNP (last 3 results) No results for input(s): PROBNP in the last 8760 hours. HbA1C: No results for input(s): HGBA1C in the last 72 hours. CBG: Recent Labs  Lab 08/18/23 1134 08/18/23 1715 08/18/23 2156 08/19/23 0723 08/19/23 1130  GLUCAP 190* 188* 205* 166* 167*   Lipid Profile: No results for input(s): CHOL, HDL, LDLCALC, TRIG, CHOLHDL, LDLDIRECT in the last 72 hours. Thyroid Function Tests: No results for input(s): TSH, T4TOTAL, FREET4, T3FREE, THYROIDAB in the last 72 hours. Anemia Panel: No results for input(s): VITAMINB12, FOLATE, FERRITIN, TIBC, IRON, RETICCTPCT in the last 72 hours. Most Recent Urinalysis On File:     Component Value Date/Time   COLORURINE YELLOW (A) 08/13/2023 1030   APPEARANCEUR CLOUDY (A) 08/13/2023 1030   LABSPEC 1.017 08/13/2023 1030   PHURINE 5.0 08/13/2023 1030   GLUCOSEU >=500 (A) 08/13/2023 1030    HGBUR SMALL (A) 08/13/2023 1030   BILIRUBINUR NEGATIVE 08/13/2023 1030   KETONESUR NEGATIVE 08/13/2023 1030   PROTEINUR >=300 (A) 08/13/2023 1030   NITRITE NEGATIVE 08/13/2023 1030   LEUKOCYTESUR NEGATIVE 08/13/2023 1030   Sepsis Labs: @LABRCNTIP (procalcitonin:4,lacticidven:4) Microbiology: Recent Results (from the past 240 hours)  Blood culture (routine x 2)     Status: None   Collection Time: 08/12/23  3:34 PM   Specimen: BLOOD  Result Value Ref Range Status   Specimen Description   Final    BLOOD BLOOD RIGHT ARM Performed at [facility], [address] [place], [place] [address]    Special Requests   Final    BOTTLES DRAWN AEROBIC AND ANAEROBIC Blood Culture results may not be optimal due to an inadequate volume of blood received in culture bottles Performed at [facility], [address] [place], [place] [address]    Culture   Final    NO GROWTH 6 DAYS Performed at [facility], [address], [place], [place] [address]    Report Status 08/18/2023 FINAL  Final  Blood culture (routine x 2)     Status: None   Collection Time: 08/12/23  5:10 PM   Specimen: BLOOD  Result Value Ref Range Status   Specimen Description   Final    BLOOD BLOOD RIGHT HAND Performed at [facility], [address] [place], [place] [address]    Special Requests   Final    BOTTLES DRAWN AEROBIC AND ANAEROBIC Blood Culture adequate volume Performed at [facility], [address], [place], [place] [address]    Culture   Final    NO GROWTH 6 DAYS Performed at [facility], [address] [place], [place] [address]    Report Status 08/18/2023 FINAL  Final  Aerobic Culture w Gram Stain (superficial specimen)     Status: None   Collection Time: 08/14/23  1:37 PM   Specimen: Wound  Result Value Ref Range Status   Specimen Description   Final    WOUND Performed at [facility], [address], [place], [place] [address]     Special Requests   Final     LEFT FOOT Performed at [facility], [address] [place], [place] [address]    Gram Stain   Final    RARE WBC PRESENT, PREDOMINANTLY PMN ABUNDANT GRAM POSITIVE COCCI ABUNDANT GRAM NEGATIVE RODS FEW GRAM POSITIVE RODS Performed at [facility], [address] [place], [place] [address]    Culture   Final    MODERATE METHICILLIN RESISTANT STAPHYLOCOCCUS AUREUS   Report Status 08/17/2023 FINAL  Final   Organism ID, Bacteria METHICILLIN RESISTANT STAPHYLOCOCCUS AUREUS  Final      Susceptibility   Methicillin resistant staphylococcus aureus - MIC*    CIPROFLOXACIN  >=8 RESISTANT Resistant     ERYTHROMYCIN >=8 RESISTANT Resistant     GENTAMICIN  <=0.5 SENSITIVE Sensitive     OXACILLIN >=4 RESISTANT Resistant     TETRACYCLINE <=1 SENSITIVE Sensitive     VANCOMYCIN  1 SENSITIVE Sensitive     TRIMETH/SULFA <=10 SENSITIVE Sensitive     CLINDAMYCIN  >=8 RESISTANT Resistant     RIFAMPIN <=0.5 SENSITIVE Sensitive     Inducible Clindamycin  NEGATIVE Sensitive     LINEZOLID  1 SENSITIVE Sensitive     * MODERATE METHICILLIN RESISTANT STAPHYLOCOCCUS AUREUS      Radiology Studies last 3 days: No results found.        Jenyfer Trawick, DO [facility] 08/19/2023, 3:30 PM    Dictation software may have been used to generate the above note. Typos may occur and escape review in typed/dictated notes. Please contact Dr Marsa directly for clarity if needed.  Staff may message me via secure chat in Epic  but this may not receive an immediate response,  please page me for urgent matters!  If 7PM-7AM, please contact night coverage [email or web address]

## 2023-08-20 DIAGNOSIS — L02619 Cutaneous abscess of unspecified foot: Secondary | ICD-10-CM | POA: Diagnosis not present

## 2023-08-20 DIAGNOSIS — L03119 Cellulitis of unspecified part of limb: Secondary | ICD-10-CM | POA: Diagnosis not present

## 2023-08-20 LAB — BASIC METABOLIC PANEL WITH GFR
Anion gap: 7 (ref 5–15)
BUN: 72 mg/dL — ABNORMAL HIGH (ref 6–20)
CO2: 27 mmol/L (ref 22–32)
Calcium: 8.6 mg/dL — ABNORMAL LOW (ref 8.9–10.3)
Chloride: 103 mmol/L (ref 98–111)
Creatinine, Ser: 2.85 mg/dL — ABNORMAL HIGH (ref 0.61–1.24)
GFR, Estimated: 25 mL/min — ABNORMAL LOW (ref >60)
Glucose, Bld: 156 mg/dL — ABNORMAL HIGH (ref 70–99)
Potassium: 4.4 mmol/L (ref 3.5–5.1)
Sodium: 137 mmol/L (ref 135–145)

## 2023-08-20 LAB — GLUCOSE, CAPILLARY: Glucose-Capillary: 125 mg/dL — ABNORMAL HIGH (ref 70–99)

## 2023-08-20 MED ORDER — ACETAMINOPHEN 325 MG PO TABS
650.0000 mg | ORAL_TABLET | Freq: Four times a day (QID) | ORAL | Status: DC | PRN
Start: 2023-08-20 — End: 2023-11-12

## 2023-08-20 MED ORDER — AMLODIPINE BESYLATE 5 MG PO TABS: 5.0000 mg | ORAL_TABLET | Freq: Every day | ORAL | 0 refills | Status: DC

## 2023-08-20 MED ORDER — METOPROLOL TARTRATE 25 MG PO TABS: 25.0000 mg | ORAL_TABLET | Freq: Two times a day (BID) | ORAL | 0 refills | Status: DC

## 2023-08-20 MED ORDER — GENTAMICIN SULFATE 0.1 % EX OINT: TOPICAL_OINTMENT | Freq: Every day | CUTANEOUS | 0 refills | Status: DC

## 2023-08-20 MED ORDER — LINEZOLID 600 MG PO TABS: 600.0000 mg | ORAL_TABLET | Freq: Two times a day (BID) | ORAL | 0 refills | Status: AC

## 2023-08-20 NOTE — Discharge Summary (Signed)
 Physician Discharge Summary   Patient: Jason Jennings MRN: [id number]  DOB: 08/16/1965   Admit:     Date of Admission: 08/12/2023 Admitted from: home   Discharge: Date of discharge: 08/20/23 Disposition: Home Condition at discharge: good  CODE STATUS: FULL CODE     Discharge Physician: Laneta Blunt, DO [facility]     PCP: Valere Ozell Shove, MD  Recommendations for Outpatient Follow-up:  Follow up with PCP Valere Ozell Shove, MD in 1-2 weeks Follow asap w/ nephrology  Please obtain labs/tests: CBC, BMP in 3-5 days    Discharge Instructions     Call MD for:  difficulty breathing, headache or visual disturbances   Complete by: As directed    Call MD for:  persistant dizziness or light-headedness   Complete by: As directed    Call MD for:  severe uncontrolled pain   Complete by: As directed    Call MD for:  temperature >100.4   Complete by: As directed    Diet - low sodium heart healthy   Complete by: As directed    Discharge wound care:   Complete by: As directed    apply gentamyacin ointment to the left foot ulcer and then cover with nonadherent pad/dressing, then cover with dry guaze 4x4 wrap with kerlix and ace wrap   Increase activity slowly   Complete by: As directed          Discharge Diagnoses: Active Problems:   Cellulitis   Acute renal failure superimposed on stage 3b chronic kidney disease (HCC)   Type 2 diabetes mellitus with hyperlipidemia (HCC)   HTN (hypertension)   Neuropathy, diabetic (HCC)   Diabetic foot ulcer (HCC)   Hyperparathyroidism due to renal insufficiency (HCC)   H/O colon cancer, stage II   Ulcer of left foot with fat layer exposed [facility])      Hospital course / significant events:   HPI: Jason Jennings is a 58 y.o. 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  He was in wound  care for approximately 3 months following debridement and finally achieved wound closure in March 2025.  Presented to ED 08/12/23 w/ 1 day history of increasing pain and swelling and erythema in his lower leg, significant pain at the ball of his foot under the callus that he describes as feeling like an abscess under there, also w/ new nausea and vomiting   07/05: admitted to hospitalist w/ sepsis d/t cellulitis and superficial abscess plantar surface L foot. Also AKI. MRI left foot without contrast: Ulceration plantar aspect fourth metatarsal head without obvious large abscess and reactive marrow edema in the fourth proximal phalanx without evidence of osteomyelitis.  07/06: continued on IV abx  07/07: podiatry consult - bedside I&D, ok to transition to po abx when cultures available 07/08-07/09: still significant erythema/tenderness, continue abx  07/10: culture (+)MRSA, erythema/swelling a bit worse today despite being on the po linezolid  so will go w/ IV tx  07/11: Cellulitis improving, will stay 1-2 more day on IV antibiotics 07/12: pt feels IV abx are helping and requests one more day on this, he is concerned about renal function, ok for another day given erythema about same as yesterday / high risk  07/13: skin looking much better. Renal function still borderline but has follow up. Ok for [employer] home on linezolid  po     Consultants:  podiatry  Procedures/Surgeries: none      ASSESSMENT & PLAN:   Sepsis secondary to cellulitis of the left leg-present on admission (+)MRSA  Superficial abscess of plantar surface of left foot s/p debridement at bedside by podiatry on 08/14/23 Patient met sepsis criteria with WBC 12.1, pulse 110 in the setting of cellulitis. MRI did not show evidence of osteomyelitis Normal ABIs in January 2025 Finish total 14 fays abx  Wound care: daily dressing change to the left foot with gentamicin  ointment and cover wound with Hydrofera Blue or Xeroform 4 x 4  gauze Kerlix and Ace roll WB status: Weightbearing as tolerated in postop shoe follow-up with [place] [facility] upon discharge.  He had trouble following up with podiatry office due to insurance issues. Podiatry team has s/o, reengage prn   Acute renal failure / AKI superimposed on stage 3b chronic kidney disease  Baseline creatinine is around 2.3 Renal function improved to baseline Continue holding ARB and diuretic Avoid nephrotoxic agents Monitor BMP Nephrology / PCP follow up    Hypokalemia Replace as needed Monitor BMP   Type 2 diabetes mellitus with hyperlipidemia (HCC) Resume home insulin     HTN (hypertension) Continue holding losartan  d/t renal function  Calcium  channel blocker, beta blocker    Neuropathy, diabetic  Continue gabapentin    H/O colon cancer, stage II Treated at [facility]  Status post neoadjuvant chemoradiation with low anterior resection in 2016. Recurrence treated with chemoradiation in 2020 and further surgery Follow outpatient       Class 1 obesity based on BMI: Body mass index is 30.46 kg/m.[name] Significantly low or high BMI is associated with higher medical risk.  Underweight - under 18  overweight - 25 to 29 obese - 30 or more Class 1 obesity: BMI of 30.0 to 34 Class 2 obesity: BMI of 35.0 to 39 Class 3 obesity: BMI of 40.0 to 49 Super Morbid Obesity: BMI 50-59 Super-super Morbid Obesity: BMI 60+ Healthy nutrition and physical activity advised as adjunct to other disease management and risk reduction treatments              Discharge Instructions  Allergies as of 08/20/2023   No Known Allergies      Medication List     PAUSE taking these medications    losartan  50 MG tablet Wait to take this until your doctor or other care provider tells you to start again. Commonly known as: COZAAR  Take 50 mg by mouth daily.   torsemide 10 MG tablet Wait to take this until your doctor or other care provider tells you to start  again. Commonly known as: DEMADEX Take 10 mg by mouth every Monday, Wednesday, Friday, and Saturday at 6 PM.       STOP taking these medications    predniSONE 10 MG (21) Tbpk tablet Commonly known as: STERAPRED UNI-PAK 21 TAB       TAKE these medications    acetaminophen  325 MG tablet Commonly known as: TYLENOL  Take 2 tablets (650 mg total) by mouth every 6 (six) hours as needed for mild pain (pain score 1-3) or fever (or Fever >/= 101).   amLODipine  5 MG tablet Commonly known as: NORVASC  Take 1 tablet (5 mg total) by mouth daily. Start taking on: August 21, 2023   fluticasone-salmeterol 250-50 MCG/ACT Aepb Commonly known as: ADVAIR Inhale 1 puff into the lungs 2 (two) times daily.   FreeStyle Libre 2 Reader Devi 1 Box by Does not apply route 2 (two) times daily.  FreeStyle Libre 3 Reader Devi 1 Box by Does not apply route 2 (two) times daily.   FreeStyle Libre 3 Plus Sensor Misc Change sensor every 15 days.   FreeStyle Libre 3 Plus Sensor Misc Change sensor every 15 days.   gabapentin  100 MG capsule Commonly known as: NEURONTIN  Take 100 mg by mouth 3 (three) times daily.   gentamicin  ointment 0.1 % Commonly known as: GARAMYCIN  Apply topically daily. To L foot ulcer until healed Start taking on: August 21, 2023   HumaLOG  Mix 75/25 KwikPen (75-25) 100 UNIT/ML KwikPen Generic drug: Insulin  Lispro Prot & Lispro Inject 30 Units into the skin 2 (two) times daily with a meal. Qam and in the evening.      (He takes both Humalog  mix 75/25 BID and then Lantus  at bedtime) Pt reports taking to 25 units BID   Lantus  SoloStar 100 UNIT/ML Solostar Pen Generic drug: insulin  glargine Inject 14 Units into the skin daily. Pt reports taking 12 units once daily   linezolid  600 MG tablet Commonly known as: ZYVOX  Take 1 tablet (600 mg total) by mouth 2 (two) times daily for 13 doses. Start with one dose evening 08/20/23 then twice daily until all used   methocarbamol  750 MG  tablet Commonly known as: ROBAXIN  Take 750 mg by mouth 2 (two) times daily as needed.   metoprolol  tartrate 25 MG tablet Commonly known as: LOPRESSOR  Take 1 tablet (25 mg total) by mouth 2 (two) times daily.   Oxycodone  HCl 10 MG Tabs Take 10 mg by mouth every 6 (six) hours as needed.   rosuvastatin  20 MG tablet Commonly known as: CRESTOR  Take 20 mg by mouth daily.   tamsulosin  0.4 MG Caps capsule Commonly known as: FLOMAX  Take 0.4 mg by mouth daily.   testosterone cypionate 100 MG/ML injection Commonly known as: DEPOTESTOTERONE CYPIONATE INJECT 1/2 (ONE-HALF) ML INTRAMUSCULARLY EVERY TWO WEEKS (DISCARD VIAL AFTER 30 DAYS)               Discharge Care Instructions  (From admission, onward)           Start     Ordered   08/20/23 0000  Discharge wound care:       Comments: apply gentamyacin ointment to the left foot ulcer and then cover with nonadherent pad/dressing, then cover with dry guaze 4x4 wrap with kerlix and ace wrap   08/20/23 1047              No Known Allergies   Subjective: pt reports skin is less tender and swollen today, still some edema, no CP/SOB, ambulating ok   Discharge Exam: BP (!) 145/70 (BP Location: Right Arm)   Pulse 64   Temp 97.9 F (36.6 C)   Resp 17   Ht 5' 6 (1.676 m)   Wt 85.6 kg   SpO2 98%   BMI 30.46 kg/m  General: Pt is alert, awake, not in acute distress Cardiovascular: RRR, S1/S2 +, no rubs, no gallops Respiratory: CTA bilaterally, no wheezing, no rhonchi Abdominal: Soft, NT, ND, bowel sounds + Extremities: +1 LE edema, no cyanosis, skin LLE improved w/ mild erythema and minimal edema now compared to previous exams      The results of significant diagnostics from this hospitalization (including imaging, microbiology, ancillary and laboratory) are listed below for reference.     Microbiology: Recent Results (from the past 240 hours)  Blood culture (routine x 2)     Status: None   Collection Time:  08/12/23  3:34 PM   Specimen: BLOOD  Result Value Ref Range Status   Specimen Description   Final    BLOOD BLOOD RIGHT ARM Performed at [facility], [address] [place], [place] [address]    Special Requests   Final    BOTTLES DRAWN AEROBIC AND ANAEROBIC Blood Culture results may not be optimal due to an inadequate volume of blood received in culture bottles Performed at [facility], [address] [place], [place] [address]    Culture   Final    NO GROWTH 6 DAYS Performed at [facility], [address], [place], [place] [address]    Report Status 08/18/2023 FINAL  Final  Blood culture (routine x 2)     Status: None   Collection Time: 08/12/23  5:10 PM   Specimen: BLOOD  Result Value Ref Range Status   Specimen Description   Final    BLOOD BLOOD RIGHT HAND Performed at [facility], [address], [place], [place] [address]    Special Requests   Final    BOTTLES DRAWN AEROBIC AND ANAEROBIC Blood Culture adequate volume Performed at [facility], [address], [place], [place] [address]    Culture   Final    NO GROWTH 6 DAYS Performed at [facility], [address] [place], [place] [address]    Report Status 08/18/2023 FINAL  Final  Aerobic Culture w Gram Stain (superficial specimen)     Status: None   Collection Time: 08/14/23  1:37 PM   Specimen: Wound  Result Value Ref Range Status   Specimen Description   Final    WOUND Performed at [facility], [address] [place], [place] [address]    Special Requests   Final     LEFT FOOT Performed at [facility], [address] [place], [place] [address]    Gram Stain   Final    RARE WBC PRESENT, PREDOMINANTLY PMN ABUNDANT GRAM POSITIVE COCCI ABUNDANT GRAM NEGATIVE RODS FEW GRAM POSITIVE RODS Performed at [facility], [address] [place], [place] [address]    Culture   Final    MODERATE METHICILLIN RESISTANT STAPHYLOCOCCUS  AUREUS   Report Status 08/17/2023 FINAL  Final   Organism ID, Bacteria METHICILLIN RESISTANT STAPHYLOCOCCUS AUREUS  Final      Susceptibility   Methicillin resistant staphylococcus aureus - MIC*    CIPROFLOXACIN  >=8 RESISTANT Resistant     ERYTHROMYCIN >=8 RESISTANT Resistant     GENTAMICIN  <=0.5 SENSITIVE Sensitive     OXACILLIN >=4 RESISTANT Resistant     TETRACYCLINE <=1 SENSITIVE Sensitive     VANCOMYCIN  1 SENSITIVE Sensitive     TRIMETH/SULFA <=10 SENSITIVE Sensitive     CLINDAMYCIN  >=8 RESISTANT Resistant     RIFAMPIN <=0.5 SENSITIVE Sensitive     Inducible Clindamycin  NEGATIVE Sensitive     LINEZOLID  1 SENSITIVE Sensitive     * MODERATE METHICILLIN RESISTANT STAPHYLOCOCCUS AUREUS     Labs: BNP (last 3 results) No results for input(s): BNP in the last 8760 hours. Basic Metabolic Panel: Recent Labs  Lab 08/16/23 0529 08/17/23 0240 08/18/23 0527 08/19/23 0726 08/20/23 0448  NA 138 139 137 138 137  K 3.6 4.0 4.2 4.6 4.4  CL 103 102 101 104 103  CO2 24 25 25 26 27   GLUCOSE 96 135* 97 171* 156*  BUN 40* 51* 59* 68* 72*  CREATININE 2.40* 2.82* 2.62* 2.76* 2.85*  CALCIUM  8.4*  8.4* 8.6* 8.7* 8.6*   Liver Function Tests: No results for input(s): AST, ALT, ALKPHOS, BILITOT, PROT, ALBUMIN in the last 168 hours. No results for input(s): LIPASE, AMYLASE in the last 168 hours. No results for input(s): AMMONIA in the last 168 hours. CBC: Recent Labs  Lab 08/14/23 0522 08/15/23 0532 08/16/23 0529 08/19/23 0726  WBC 8.4 7.4 6.8 6.7  NEUTROABS 7.2 5.9 5.3  --   HGB 10.4* 10.4* 10.5* 10.3*  HCT 30.7* 30.9* 31.2* 32.0*  MCV 87.0 86.6 85.7 87.9  PLT 144* 146* 171 214   Cardiac Enzymes: No results for input(s): CKTOTAL, CKMB, CKMBINDEX, TROPONINI in the last 168 hours. BNP: Invalid input(s): POCBNP CBG: Recent Labs  Lab 08/19/23 0723 08/19/23 1130 08/19/23 1658 08/19/23 2105 08/20/23 0800  GLUCAP 166* 167* 256* 118* 125*    D-Dimer No results for input(s): DDIMER in the last 72 hours. Hgb A1c No results for input(s): HGBA1C in the last 72 hours. Lipid Profile No results for input(s): CHOL, HDL, LDLCALC, TRIG, CHOLHDL, LDLDIRECT in the last 72 hours. Thyroid function studies No results for input(s): TSH, T4TOTAL, T3FREE, THYROIDAB in the last 72 hours.  Invalid input(s): FREET3 Anemia work up No results for input(s): VITAMINB12, FOLATE, FERRITIN, TIBC, IRON, RETICCTPCT in the last 72 hours. Urinalysis    Component Value Date/Time   COLORURINE YELLOW (A) 08/13/2023 1030   APPEARANCEUR CLOUDY (A) 08/13/2023 1030   LABSPEC 1.017 08/13/2023 1030   PHURINE 5.0 08/13/2023 1030   GLUCOSEU >=500 (A) 08/13/2023 1030   HGBUR SMALL (A) 08/13/2023 1030   BILIRUBINUR NEGATIVE 08/13/2023 1030   KETONESUR NEGATIVE 08/13/2023 1030   PROTEINUR >=300 (A) 08/13/2023 1030   NITRITE NEGATIVE 08/13/2023 1030   LEUKOCYTESUR NEGATIVE 08/13/2023 1030   Sepsis Labs Recent Labs  Lab 08/14/23 0522 08/15/23 0532 08/16/23 0529 08/19/23 0726  WBC 8.4 7.4 6.8 6.7   Microbiology Recent Results (from the past 240 hours)  Blood culture (routine x 2)     Status: None   Collection Time: 08/12/23  3:34 PM   Specimen: BLOOD  Result Value Ref Range Status   Specimen Description   Final    BLOOD BLOOD RIGHT ARM Performed at [facility], [address], [place], [place] [address]    Special Requests   Final    BOTTLES DRAWN AEROBIC AND ANAEROBIC Blood Culture results may not be optimal due to an inadequate volume of blood received in culture bottles Performed at [facility], [address], [place], [place] [address]    Culture   Final    NO GROWTH 6 DAYS Performed at [facility], [address] [place], [place] [address]    Report Status 08/18/2023 FINAL  Final  Blood culture (routine x 2)     Status: None   Collection Time: 08/12/23  5:10 PM    Specimen: BLOOD  Result Value Ref Range Status   Specimen Description   Final    BLOOD BLOOD RIGHT HAND Performed at [facility], [address], [place], [place] [address]    Special Requests   Final    BOTTLES DRAWN AEROBIC AND ANAEROBIC Blood Culture adequate volume Performed at [facility], [address], [place], [place] [address]    Culture   Final    NO GROWTH 6 DAYS Performed at [facility], [address] [place], [place] [address]    Report Status 08/18/2023 FINAL  Final  Aerobic Culture w Gram Stain (superficial  specimen)     Status: None   Collection Time: 08/14/23  1:37 PM   Specimen: Wound  Result Value Ref Range Status   Specimen Description   Final    WOUND Performed at [facility], [address] [place], [place] [address]    Special Requests   Final     LEFT FOOT Performed at [facility], [address] [place], [place] [address]    Gram Stain   Final    RARE WBC PRESENT, PREDOMINANTLY PMN ABUNDANT GRAM POSITIVE COCCI ABUNDANT GRAM NEGATIVE RODS FEW GRAM POSITIVE RODS Performed at [facility], [address] [place], [place] [address]    Culture   Final    MODERATE METHICILLIN RESISTANT STAPHYLOCOCCUS AUREUS   Report Status 08/17/2023 FINAL  Final   Organism ID, Bacteria METHICILLIN RESISTANT STAPHYLOCOCCUS AUREUS  Final      Susceptibility   Methicillin resistant staphylococcus aureus - MIC*    CIPROFLOXACIN  >=8 RESISTANT Resistant     ERYTHROMYCIN >=8 RESISTANT Resistant     GENTAMICIN  <=0.5 SENSITIVE Sensitive     OXACILLIN >=4 RESISTANT Resistant     TETRACYCLINE <=1 SENSITIVE Sensitive     VANCOMYCIN  1 SENSITIVE Sensitive     TRIMETH/SULFA <=10 SENSITIVE Sensitive     CLINDAMYCIN  >=8 RESISTANT Resistant     RIFAMPIN <=0.5 SENSITIVE Sensitive     Inducible Clindamycin  NEGATIVE Sensitive     LINEZOLID  1 SENSITIVE Sensitive     * MODERATE METHICILLIN RESISTANT STAPHYLOCOCCUS AUREUS    Imaging MR FOOT LEFT WO CONTRAST Result Date: 08/12/2023 CLINICAL DATA:  Diabetic foot swelling. Osteomyelitis suspected. History of polyneuropathy and chronic kidney disease. Undergoing wound care for 3 months with previous debridement. Increasing pain, swelling and erythema along the ball of the foot. EXAM: MRI OF THE LEFT FOOT WITHOUT CONTRAST TECHNIQUE: Multiplanar, multisequence MR imaging of the left foot was performed. No intravenous contrast was administered. COMPARISON:  Radiographs 08/12/2023 and 01/28/2023.  MRI 01/29/2023. FINDINGS: Bones/Joint/Cartilage There is soft tissue ulceration along the plantar aspect of the 4th metatarsophalangeal joint and 4th proximal phalanx. Mild nonspecific marrow T2 hyperintensity within the base of the 4th proximal phalanx without associated T1 marrow signal abnormality or cortical destruction. The 4th metatarsal head appears normal. No significant joint effusion. The additional rays appear stable, without acute or suspicious findings. Stable mild degenerative changes at the 1st metatarsophalangeal joint and chronic erosions within the 2nd metatarsal head. Interval ankylosis of the 5th middle and distal phalanges without suspicious marrow abnormality. The alignment is normal at the Lisfranc joint. Ligaments Intact Lisfranc ligament. The collateral ligaments of the metatarsophalangeal joints are intact. Muscles and Tendons Chronic forefoot muscular atrophy and T2 hyperintensity, similar to previous MRI. The forefoot tendons appear intact, without tenosynovitis. Soft tissues As above, focal skin ulceration along the plantar aspect of the 4th metatarsophalangeal joint and base of the 4th proximal phalanx. There is a small amount of adjacent ill-defined subcutaneous fluid without organized fluid collection. Dorsal subcutaneous edema without focal fluid collection, improved from the previous MRI. IMPRESSION: 1. Soft tissue ulceration along the plantar aspect of the 4th  metatarsophalangeal joint and base of the 4th proximal phalanx. No organized fluid collection to suggest abscess. 2. Mild nonspecific marrow T2 hyperintensity in the base of the 4th proximal phalanx, reactive etiology favored. No associated T1 marrow signal abnormality or cortical destruction to strongly suggest osteomyelitis. No evidence of septic arthritis. 3. Dorsal subcutaneous edema without focal fluid collection, improved from previous MRI. 4.  Stable chronic muscular atrophy and T2 hyperintensity, consistent with chronic denervation. Electronically Signed   By: Elsie Perone M.D.   On: 08/12/2023 19:44   US  Venous Img Lower Unilateral Left Result Date: 08/12/2023 CLINICAL DATA:  Redness and pain in the LEFT lower extremity EXAM: LEFT LOWER EXTREMITY VENOUS DOPPLER ULTRASOUND TECHNIQUE: Gray-scale sonography with compression, as well as color and duplex ultrasound, were performed to evaluate the deep venous system(s) from the level of the common femoral vein through the popliteal and proximal calf veins. COMPARISON:  None Available. FINDINGS: VENOUS Normal compressibility of the common femoral, superficial femoral, and popliteal veins, as well as the visualized calf veins. Visualized portions of profunda femoral vein and great saphenous vein unremarkable. No filling defects to suggest DVT on grayscale or color Doppler imaging. Doppler waveforms show normal direction of venous flow, normal respiratory plasticity and response to augmentation. Limited views of the contralateral common femoral vein are unremarkable. OTHER None. Limitations: none IMPRESSION: No LEFT lower extremity deep venous thrombosis. Electronically Signed   By: Jackquline Boxer M.D.   On: 08/12/2023 17:01   DG Foot Complete Left Result Date: 08/12/2023 CLINICAL DATA:  Leg infection with neuropathy in feet. EXAM: LEFT FOOT - COMPLETE 3+ VIEW COMPARISON:  01/28/2023. FINDINGS: There is no evidence of acute fracture or dislocation. No  periosteal elevation or bony erosion is seen. Degenerative changes are present at the first metatarsophalangeal joint, midfoot, and ankle. Vascular calcifications are noted in the soft tissues. Soft tissue swelling is noted about the ankle. IMPRESSION: No radiographic evidence of osteomyelitis. Electronically Signed   By: Leita Birmingham M.D.   On: 08/12/2023 16:37      Time coordinating discharge: over 30 minutes  SIGNED:  Necie Wilcoxson DO [facility]

## 2023-09-14 ENCOUNTER — Encounter: Payer: Self-pay | Admitting: Podiatry

## 2023-09-14 ENCOUNTER — Ambulatory Visit: Admitting: Podiatry

## 2023-09-14 DIAGNOSIS — B351 Tinea unguium: Secondary | ICD-10-CM | POA: Diagnosis not present

## 2023-09-14 DIAGNOSIS — M79675 Pain in left toe(s): Secondary | ICD-10-CM

## 2023-09-14 DIAGNOSIS — M79674 Pain in right toe(s): Secondary | ICD-10-CM | POA: Diagnosis not present

## 2023-09-14 DIAGNOSIS — E0843 Diabetes mellitus due to underlying condition with diabetic autonomic (poly)neuropathy: Secondary | ICD-10-CM | POA: Diagnosis not present

## 2023-09-18 NOTE — Progress Notes (Signed)
  Subjective:  Patient ID: Jason Jennings, male    DOB: 1965-08-13,  MRN: [id number]  Jason Jennings presents to clinic today for at risk foot care with history of diabetic neuropathy and painful thick toenails that are difficult to trim. Pain interferes with ambulation. Aggravating factors include wearing enclosed shoe gear. Pain is relieved with periodic professional debridement.  Chief Complaint  Patient presents with   [facility]    Rm1 Diabetic foot care /A1c 12/Blood sugar 134   New problem(s): None.   PCP is Jason Ozell Shove, MD. [name] 06/02/2023.  No Known Allergies  Review of Systems: Negative except as noted in the HPI.  Objective: No changes noted in today's physical examination. There were no vitals filed for this visit. Jason Jennings is a pleasant 58 y.o. male in NAD. AAO x 3.  Vascular Examination: CFT <3 seconds b/l. DP/PT pulses faintly palpable b/l. Digital hair absent.  Skin temperature gradient warm to warm b/l. No pain with calf compression. No ischemia or gangrene. No cyanosis or clubbing noted b/l. Trace edema noted BLE.   Neurological Examination: Pt has subjective symptoms of neuropathy. Protective sensation diminished with 10g monofilament b/l.  Dermatological Examination: Healed plantar wound LLE. Pedal skin warm and supple b/l.   No open wounds. No interdigital macerations.  Toenails 2-5 b/l thick, discolored, elongated with subungual debris and pain on dorsal palpation.  Bilateral great toes short and incurvated at medial and lateral borders. No erythema, no edema, no drainage, no fluctuance.  No corns, calluses, nor porokeratotic lesions.  Musculoskeletal Examination: Normal muscle strength 5/5 to all lower extremity muscle groups bilaterally. Pes planus deformity noted bilateral LE.[name] No pain, crepitus or joint limitation noted with ROM b/l LE.  Patient ambulates independently without assistive aids.  Radiographs: None  Last A1c:      Latest  Ref Rng & Units 08/12/2023    3:34 PM 01/28/2023    4:35 PM  Hemoglobin A1C  Hemoglobin-A1c 4.8 - 5.6 % 11.0  11.4    Assessment/Plan: 1. Pain due to onychomycosis of toenails of both feet   2. Diabetes mellitus due to underlying condition with diabetic autonomic neuropathy, unspecified whether long term insulin  use (HCC)   Patient was evaluated and treated. All patient's and/or POA's questions/concerns addressed on today's visit. Toenails 2-5 bilaterally debrided in length and girth without incident. Continue foot and shoe inspections daily. Monitor blood glucose per PCP/Endocrinologist's recommendations. Continue soft, supportive shoe gear daily. Report any pedal injuries to medical professional. Call office if there are any questions/concerns. -Patient/POA to call should there be question/concern in the interim.   Return in about 3 months (around 12/15/2023).  Jason Jennings, DPM      [facility] LOCATION: [address] [place] [address]                   Office [phone]   [facility] LOCATION: [address] [place], [place] [address] Office [phone]

## 2023-11-02 ENCOUNTER — Encounter: Payer: Self-pay | Admitting: Emergency Medicine

## 2023-11-02 ENCOUNTER — Emergency Department

## 2023-11-02 ENCOUNTER — Inpatient Hospital Stay
Admission: EM | Admit: 2023-11-02 | Discharge: 2023-12-09 | DRG: 359 | Disposition: E | Attending: Student | Admitting: Student

## 2023-11-02 ENCOUNTER — Other Ambulatory Visit: Payer: Self-pay

## 2023-11-02 DIAGNOSIS — E860 Dehydration: Secondary | ICD-10-CM | POA: Diagnosis not present

## 2023-11-02 DIAGNOSIS — R001 Bradycardia, unspecified: Secondary | ICD-10-CM | POA: Diagnosis not present

## 2023-11-02 DIAGNOSIS — E1142 Type 2 diabetes mellitus with diabetic polyneuropathy: Secondary | ICD-10-CM | POA: Diagnosis present

## 2023-11-02 DIAGNOSIS — I10 Essential (primary) hypertension: Secondary | ICD-10-CM | POA: Diagnosis present

## 2023-11-02 DIAGNOSIS — E1143 Type 2 diabetes mellitus with diabetic autonomic (poly)neuropathy: Secondary | ICD-10-CM | POA: Diagnosis present

## 2023-11-02 DIAGNOSIS — Z951 Presence of aortocoronary bypass graft: Secondary | ICD-10-CM

## 2023-11-02 DIAGNOSIS — R0902 Hypoxemia: Secondary | ICD-10-CM | POA: Diagnosis not present

## 2023-11-02 DIAGNOSIS — E1122 Type 2 diabetes mellitus with diabetic chronic kidney disease: Secondary | ICD-10-CM | POA: Diagnosis present

## 2023-11-02 DIAGNOSIS — E66811 Obesity, class 1: Secondary | ICD-10-CM | POA: Diagnosis present

## 2023-11-02 DIAGNOSIS — E871 Hypo-osmolality and hyponatremia: Secondary | ICD-10-CM | POA: Diagnosis not present

## 2023-11-02 DIAGNOSIS — E1110 Type 2 diabetes mellitus with ketoacidosis without coma: Principal | ICD-10-CM | POA: Diagnosis present

## 2023-11-02 DIAGNOSIS — I4891 Unspecified atrial fibrillation: Secondary | ICD-10-CM | POA: Diagnosis not present

## 2023-11-02 DIAGNOSIS — E876 Hypokalemia: Secondary | ICD-10-CM | POA: Diagnosis not present

## 2023-11-02 DIAGNOSIS — E785 Hyperlipidemia, unspecified: Secondary | ICD-10-CM | POA: Diagnosis present

## 2023-11-02 DIAGNOSIS — R34 Anuria and oliguria: Secondary | ICD-10-CM | POA: Diagnosis not present

## 2023-11-02 DIAGNOSIS — E11319 Type 2 diabetes mellitus with unspecified diabetic retinopathy without macular edema: Secondary | ICD-10-CM | POA: Diagnosis present

## 2023-11-02 DIAGNOSIS — N1832 Chronic kidney disease, stage 3b: Secondary | ICD-10-CM | POA: Diagnosis present

## 2023-11-02 DIAGNOSIS — I472 Ventricular tachycardia, unspecified: Secondary | ICD-10-CM | POA: Diagnosis not present

## 2023-11-02 DIAGNOSIS — Z933 Colostomy status: Secondary | ICD-10-CM

## 2023-11-02 DIAGNOSIS — Z9049 Acquired absence of other specified parts of digestive tract: Secondary | ICD-10-CM

## 2023-11-02 DIAGNOSIS — Z992 Dependence on renal dialysis: Secondary | ICD-10-CM

## 2023-11-02 DIAGNOSIS — Z7902 Long term (current) use of antithrombotics/antiplatelets: Secondary | ICD-10-CM

## 2023-11-02 DIAGNOSIS — K9409 Other complications of colostomy: Secondary | ICD-10-CM | POA: Diagnosis present

## 2023-11-02 DIAGNOSIS — Z794 Long term (current) use of insulin: Secondary | ICD-10-CM

## 2023-11-02 DIAGNOSIS — D631 Anemia in chronic kidney disease: Secondary | ICD-10-CM | POA: Diagnosis present

## 2023-11-02 DIAGNOSIS — Z7951 Long term (current) use of inhaled steroids: Secondary | ICD-10-CM

## 2023-11-02 DIAGNOSIS — I2510 Atherosclerotic heart disease of native coronary artery without angina pectoris: Secondary | ICD-10-CM | POA: Diagnosis present

## 2023-11-02 DIAGNOSIS — Z66 Do not resuscitate: Secondary | ICD-10-CM | POA: Diagnosis present

## 2023-11-02 DIAGNOSIS — Y838 Other surgical procedures as the cause of abnormal reaction of the patient, or of later complication, without mention of misadventure at the time of the procedure: Secondary | ICD-10-CM | POA: Diagnosis present

## 2023-11-02 DIAGNOSIS — E559 Vitamin D deficiency, unspecified: Secondary | ICD-10-CM | POA: Diagnosis present

## 2023-11-02 DIAGNOSIS — I130 Hypertensive heart and chronic kidney disease with heart failure and stage 1 through stage 4 chronic kidney disease, or unspecified chronic kidney disease: Secondary | ICD-10-CM | POA: Diagnosis present

## 2023-11-02 DIAGNOSIS — Z6832 Body mass index (BMI) 32.0-32.9, adult: Secondary | ICD-10-CM

## 2023-11-02 DIAGNOSIS — I255 Ischemic cardiomyopathy: Secondary | ICD-10-CM | POA: Diagnosis present

## 2023-11-02 DIAGNOSIS — Z87891 Personal history of nicotine dependence: Secondary | ICD-10-CM

## 2023-11-02 DIAGNOSIS — K859 Acute pancreatitis without necrosis or infection, unspecified: Secondary | ICD-10-CM | POA: Diagnosis present

## 2023-11-02 DIAGNOSIS — N401 Enlarged prostate with lower urinary tract symptoms: Secondary | ICD-10-CM | POA: Diagnosis present

## 2023-11-02 DIAGNOSIS — N179 Acute kidney failure, unspecified: Secondary | ICD-10-CM | POA: Diagnosis present

## 2023-11-02 DIAGNOSIS — R112 Nausea with vomiting, unspecified: Secondary | ICD-10-CM

## 2023-11-02 DIAGNOSIS — Z604 Social exclusion and rejection: Secondary | ICD-10-CM | POA: Diagnosis present

## 2023-11-02 DIAGNOSIS — Z85048 Personal history of other malignant neoplasm of rectum, rectosigmoid junction, and anus: Secondary | ICD-10-CM

## 2023-11-02 DIAGNOSIS — K5900 Constipation, unspecified: Secondary | ICD-10-CM | POA: Diagnosis not present

## 2023-11-02 DIAGNOSIS — I2102 ST elevation (STEMI) myocardial infarction involving left anterior descending coronary artery: Principal | ICD-10-CM | POA: Diagnosis present

## 2023-11-02 DIAGNOSIS — Z79899 Other long term (current) drug therapy: Secondary | ICD-10-CM

## 2023-11-02 DIAGNOSIS — R338 Other retention of urine: Secondary | ICD-10-CM | POA: Diagnosis not present

## 2023-11-02 DIAGNOSIS — I469 Cardiac arrest, cause unspecified: Secondary | ICD-10-CM | POA: Diagnosis not present

## 2023-11-02 DIAGNOSIS — Z7982 Long term (current) use of aspirin: Secondary | ICD-10-CM

## 2023-11-02 DIAGNOSIS — I5021 Acute systolic (congestive) heart failure: Secondary | ICD-10-CM | POA: Diagnosis present

## 2023-11-02 DIAGNOSIS — D638 Anemia in other chronic diseases classified elsewhere: Secondary | ICD-10-CM | POA: Diagnosis present

## 2023-11-02 DIAGNOSIS — C20 Malignant neoplasm of rectum: Secondary | ICD-10-CM

## 2023-11-02 DIAGNOSIS — N184 Chronic kidney disease, stage 4 (severe): Secondary | ICD-10-CM | POA: Diagnosis present

## 2023-11-02 LAB — HEPATIC FUNCTION PANEL
ALT: 40 U/L (ref 0–44)
AST: 122 U/L — ABNORMAL HIGH (ref 15–41)
Albumin: 2.6 g/dL — ABNORMAL LOW (ref 3.5–5.0)
Alkaline Phosphatase: 78 U/L (ref 38–126)
Bilirubin, Direct: 0.2 mg/dL (ref 0.0–0.2)
Indirect Bilirubin: 0.9 mg/dL (ref 0.3–0.9)
Total Bilirubin: 1.1 mg/dL (ref 0.0–1.2)
Total Protein: 6.2 g/dL — ABNORMAL LOW (ref 6.5–8.1)

## 2023-11-02 LAB — BASIC METABOLIC PANEL WITH GFR
Anion gap: 23 — ABNORMAL HIGH (ref 5–15)
BUN: 61 mg/dL — ABNORMAL HIGH (ref 6–20)
CO2: 17 mmol/L — ABNORMAL LOW (ref 22–32)
Calcium: 8.8 mg/dL — ABNORMAL LOW (ref 8.9–10.3)
Chloride: 94 mmol/L — ABNORMAL LOW (ref 98–111)
Creatinine, Ser: 3.29 mg/dL — ABNORMAL HIGH (ref 0.61–1.24)
GFR, Estimated: 21 mL/min — ABNORMAL LOW (ref >60)
Glucose, Bld: 513 mg/dL (ref 70–99)
Potassium: 3.8 mmol/L (ref 3.5–5.1)
Sodium: 134 mmol/L — ABNORMAL LOW (ref 135–145)

## 2023-11-02 LAB — CBC
HCT: 36.4 % — ABNORMAL LOW (ref 39.0–52.0)
Hemoglobin: 11.8 g/dL — ABNORMAL LOW (ref 13.0–17.0)
MCH: 28.5 pg (ref 26.0–34.0)
MCHC: 32.4 g/dL (ref 30.0–36.0)
MCV: 87.9 fL (ref 80.0–100.0)
Platelets: 221 K/uL (ref 150–400)
RBC: 4.14 MIL/uL — ABNORMAL LOW (ref 4.22–5.81)
RDW: 14.1 % (ref 11.5–15.5)
WBC: 14.8 K/uL — ABNORMAL HIGH (ref 4.0–10.5)
nRBC: 0 % (ref 0.0–0.2)

## 2023-11-02 LAB — BETA-HYDROXYBUTYRIC ACID: Beta-Hydroxybutyric Acid: 3.74 mmol/L — ABNORMAL HIGH (ref 0.05–0.27)

## 2023-11-02 LAB — CBG MONITORING, ED: Glucose-Capillary: 460 mg/dL — ABNORMAL HIGH (ref 70–99)

## 2023-11-02 LAB — LIPASE, BLOOD: Lipase: 300 U/L — ABNORMAL HIGH (ref 11–51)

## 2023-11-02 MED ORDER — POTASSIUM CHLORIDE 10 MEQ/100ML IV SOLN
10.0000 meq | INTRAVENOUS | Status: AC
  Administered 2023-11-02 – 2023-11-03 (×2): 10 meq via INTRAVENOUS
  Filled 2023-11-02 (×2): qty 100

## 2023-11-02 MED ORDER — LACTATED RINGERS IV SOLN: INTRAVENOUS | Status: DC

## 2023-11-02 MED ORDER — LACTATED RINGERS IV BOLUS
20.0000 mL/kg | Freq: Once | INTRAVENOUS | Status: AC
  Administered 2023-11-02: 1000 mL via INTRAVENOUS

## 2023-11-02 MED ORDER — DEXTROSE 50 % IV SOLN: 0.0000 mL | INTRAVENOUS | Status: DC | PRN

## 2023-11-02 MED ORDER — DEXTROSE IN LACTATED RINGERS 5 % IV SOLN: INTRAVENOUS | Status: DC

## 2023-11-02 MED ORDER — INSULIN REGULAR(HUMAN) IN NACL 100-0.9 UT/100ML-% IV SOLN
INTRAVENOUS | Status: DC
Start: 2023-11-02 — End: 2023-11-04
  Administered 2023-11-02: 11.5 [IU]/h via INTRAVENOUS
  Administered 2023-11-03: 3.4 [IU]/h via INTRAVENOUS
  Filled 2023-11-02 (×3): qty 100

## 2023-11-02 MED ORDER — ONDANSETRON 4 MG PO TBDP
4.0000 mg | ORAL_TABLET | Freq: Once | ORAL | Status: AC | PRN
  Administered 2023-11-02: 4 mg via ORAL
  Filled 2023-11-02: qty 1

## 2023-11-02 MED ORDER — SODIUM CHLORIDE 0.9 % IV BOLUS
1000.0000 mL | Freq: Once | INTRAVENOUS | Status: AC
  Administered 2023-11-02: 1000 mL via INTRAVENOUS

## 2023-11-02 NOTE — ED Provider Notes (Signed)
 [facility] Provider Note   Event Date/Time   First MD Initiated Contact with Patient 11/02/23 2200     (approximate) History  Emesis and Hyperglycemia  HPI Jason Jennings is a 58 y.o. male with a stated past medical history of type 2 diabetes, CKD, hypertension, and obesity who presents complaining of vomiting and epigastric pain for the last 4 days.  Patient endorses increased pain around his ostomy with increased output.  Patient also endorses increased blood sugars into the 400s.  Patient states he has not had any p.o. intake over the last 4 days.  Patient denies any recent travel, sick contacts, or food out of the ordinary ROS: Patient currently denies any vision changes, tinnitus, difficulty speaking, facial droop, sore throat, chest pain, shortness of breath, diarrhea, dysuria, or weakness/numbness/paresthesias in any extremity   Physical Exam  Triage Vital Signs: ED Triage Vitals [11/02/23 2107]  Encounter Vitals Group     BP (!) 137/94     Girls Systolic BP Percentile      Girls Diastolic BP Percentile      Boys Systolic BP Percentile      Boys Diastolic BP Percentile      Pulse Rate (!) 115     Resp 18     Temp 98.7 F (37.1 C)     Temp Source Oral     SpO2 97 %     Weight      Height      Head Circumference      Peak Flow      Pain Score 9     Pain Loc      Pain Education      Exclude from Growth Chart    Most recent vital signs: Vitals:   11/02/23 2107  BP: (!) 137/94  Pulse: (!) 115  Resp: 18  Temp: 98.7 F (37.1 C)  SpO2: 97%   General: Awake, oriented x4. CV:  Good peripheral perfusion. Resp:  Normal effort. Abd:  No distention.  Tenderness to palpation around ostomy site Other:  Middle-aged well-developed, well-nourished Caucasian male resting comfortably in no acute distress ED Results / Procedures / Treatments  Labs (all labs ordered are listed, but only abnormal results are displayed) Labs Reviewed  BASIC METABOLIC  PANEL WITH GFR - Abnormal; Notable for the following components:      Result Value   Sodium 134 (*)    Chloride 94 (*)    CO2 17 (*)    Glucose, Bld 513 (*)    BUN 61 (*)    Creatinine, Ser 3.29 (*)    Calcium  8.8 (*)    GFR, Estimated 21 (*)    Anion gap 23 (*)    All other components within normal limits  CBC - Abnormal; Notable for the following components:   WBC 14.8 (*)    RBC 4.14 (*)    Hemoglobin 11.8 (*)    HCT 36.4 (*)    All other components within normal limits  CBG MONITORING, ED - Abnormal; Notable for the following components:   Glucose-Capillary 460 (*)    All other components within normal limits  URINALYSIS, ROUTINE W REFLEX MICROSCOPIC  LIPASE, BLOOD  CBG MONITORING, ED   RADIOLOGY ED MD interpretation: Pending - All radiology independently interpreted and agree with radiology assessment Official radiology report(s): No results found. PROCEDURES: Critical Care performed: No Procedures MEDICATIONS ORDERED IN ED: Medications  ondansetron  (ZOFRAN -ODT) disintegrating tablet 4 mg (4 mg Oral Given 11/02/23 2113)  IMPRESSION / MDM / ASSESSMENT AND PLAN / ED COURSE  I reviewed the triage vital signs and the nursing notes.                             The patient is on the cardiac monitor to evaluate for evidence of arrhythmia and/or significant heart rate changes. Patient's presentation is most consistent with acute presentation with potential threat to life or bodily function. Patient is a 58 year old male with above-stated past medical history that presents for hyperglycemia, emesis, abdominal pain for the last 4 days DDx: Pancreatitis, DKA, gastroenteritis, biliary disease, appendicitis, diverticulitis Plan: BMP significant for glucose of 513 with creatinine elevated at 3.29 with an anion gap of 23 CMP concerning for leukocytosis to 14 with stable anemia of 11.8 Beta-hydroxybutyrate acid 3.74 Lipase 300 CT abdomen pelvis pending  Anticipate admission  pending CT of the abdomen and pelvis.  Care of this patient will be signed out to the oncoming physician at the end of my shift.  All pertinent patient information conveyed and all questions answered.  All further care and disposition decisions will be made by the oncoming physician.   FINAL CLINICAL IMPRESSION(S) / ED DIAGNOSES   Final diagnoses:  None   Rx / DC Orders   ED Discharge Orders     None      Note:  This document was prepared using Dragon voice recognition software and may include unintentional dictation errors.   Esti Demello K, MD 11/04/23 [phone]

## 2023-11-02 NOTE — ED Triage Notes (Addendum)
 Pt arrives via POV c/o vomiting since Monday. Pt reports abd pain around ostomy w/ increased output. 9/10. Pt diabetic and reports increase in blood sugars. CBG in triage 460

## 2023-11-03 ENCOUNTER — Inpatient Hospital Stay

## 2023-11-03 DIAGNOSIS — N184 Chronic kidney disease, stage 4 (severe): Secondary | ICD-10-CM | POA: Diagnosis present

## 2023-11-03 DIAGNOSIS — E860 Dehydration: Secondary | ICD-10-CM | POA: Diagnosis present

## 2023-11-03 DIAGNOSIS — I428 Other cardiomyopathies: Secondary | ICD-10-CM | POA: Diagnosis not present

## 2023-11-03 DIAGNOSIS — D631 Anemia in chronic kidney disease: Secondary | ICD-10-CM | POA: Diagnosis present

## 2023-11-03 DIAGNOSIS — Z794 Long term (current) use of insulin: Secondary | ICD-10-CM | POA: Diagnosis not present

## 2023-11-03 DIAGNOSIS — K9409 Other complications of colostomy: Secondary | ICD-10-CM | POA: Diagnosis present

## 2023-11-03 DIAGNOSIS — R34 Anuria and oliguria: Secondary | ICD-10-CM | POA: Diagnosis not present

## 2023-11-03 DIAGNOSIS — I5021 Acute systolic (congestive) heart failure: Secondary | ICD-10-CM | POA: Diagnosis present

## 2023-11-03 DIAGNOSIS — I2102 ST elevation (STEMI) myocardial infarction involving left anterior descending coronary artery: Secondary | ICD-10-CM | POA: Diagnosis present

## 2023-11-03 DIAGNOSIS — E1110 Type 2 diabetes mellitus with ketoacidosis without coma: Secondary | ICD-10-CM | POA: Diagnosis present

## 2023-11-03 DIAGNOSIS — I4891 Unspecified atrial fibrillation: Secondary | ICD-10-CM | POA: Diagnosis not present

## 2023-11-03 DIAGNOSIS — E11319 Type 2 diabetes mellitus with unspecified diabetic retinopathy without macular edema: Secondary | ICD-10-CM | POA: Diagnosis present

## 2023-11-03 DIAGNOSIS — I2109 ST elevation (STEMI) myocardial infarction involving other coronary artery of anterior wall: Secondary | ICD-10-CM | POA: Diagnosis not present

## 2023-11-03 DIAGNOSIS — Z992 Dependence on renal dialysis: Secondary | ICD-10-CM | POA: Diagnosis not present

## 2023-11-03 DIAGNOSIS — R079 Chest pain, unspecified: Secondary | ICD-10-CM | POA: Diagnosis not present

## 2023-11-03 DIAGNOSIS — E785 Hyperlipidemia, unspecified: Secondary | ICD-10-CM | POA: Diagnosis not present

## 2023-11-03 DIAGNOSIS — N170 Acute kidney failure with tubular necrosis: Secondary | ICD-10-CM | POA: Diagnosis not present

## 2023-11-03 DIAGNOSIS — K859 Acute pancreatitis without necrosis or infection, unspecified: Secondary | ICD-10-CM

## 2023-11-03 DIAGNOSIS — E1142 Type 2 diabetes mellitus with diabetic polyneuropathy: Secondary | ICD-10-CM | POA: Diagnosis present

## 2023-11-03 DIAGNOSIS — Z66 Do not resuscitate: Secondary | ICD-10-CM | POA: Diagnosis present

## 2023-11-03 DIAGNOSIS — I130 Hypertensive heart and chronic kidney disease with heart failure and stage 1 through stage 4 chronic kidney disease, or unspecified chronic kidney disease: Secondary | ICD-10-CM | POA: Diagnosis present

## 2023-11-03 DIAGNOSIS — E1143 Type 2 diabetes mellitus with diabetic autonomic (poly)neuropathy: Secondary | ICD-10-CM | POA: Diagnosis present

## 2023-11-03 DIAGNOSIS — J9601 Acute respiratory failure with hypoxia: Secondary | ICD-10-CM | POA: Diagnosis not present

## 2023-11-03 DIAGNOSIS — I472 Ventricular tachycardia, unspecified: Secondary | ICD-10-CM | POA: Diagnosis not present

## 2023-11-03 DIAGNOSIS — I10 Essential (primary) hypertension: Secondary | ICD-10-CM | POA: Diagnosis not present

## 2023-11-03 DIAGNOSIS — N179 Acute kidney failure, unspecified: Secondary | ICD-10-CM | POA: Diagnosis present

## 2023-11-03 DIAGNOSIS — Y838 Other surgical procedures as the cause of abnormal reaction of the patient, or of later complication, without mention of misadventure at the time of the procedure: Secondary | ICD-10-CM | POA: Diagnosis present

## 2023-11-03 DIAGNOSIS — I469 Cardiac arrest, cause unspecified: Secondary | ICD-10-CM | POA: Diagnosis not present

## 2023-11-03 DIAGNOSIS — Z933 Colostomy status: Secondary | ICD-10-CM

## 2023-11-03 DIAGNOSIS — E871 Hypo-osmolality and hyponatremia: Secondary | ICD-10-CM | POA: Diagnosis not present

## 2023-11-03 DIAGNOSIS — I2510 Atherosclerotic heart disease of native coronary artery without angina pectoris: Secondary | ICD-10-CM | POA: Diagnosis not present

## 2023-11-03 DIAGNOSIS — E66811 Obesity, class 1: Secondary | ICD-10-CM | POA: Diagnosis present

## 2023-11-03 DIAGNOSIS — R112 Nausea with vomiting, unspecified: Secondary | ICD-10-CM

## 2023-11-03 DIAGNOSIS — C20 Malignant neoplasm of rectum: Secondary | ICD-10-CM

## 2023-11-03 DIAGNOSIS — E1122 Type 2 diabetes mellitus with diabetic chronic kidney disease: Secondary | ICD-10-CM | POA: Diagnosis present

## 2023-11-03 DIAGNOSIS — R7989 Other specified abnormal findings of blood chemistry: Secondary | ICD-10-CM | POA: Diagnosis not present

## 2023-11-03 LAB — BASIC METABOLIC PANEL WITH GFR
Anion gap: 13 (ref 5–15)
Anion gap: 12 (ref 5–15)
Anion gap: 14 (ref 5–15)
Anion gap: 12 (ref 5–15)
Anion gap: 12 (ref 5–15)
BUN: 59 mg/dL — ABNORMAL HIGH (ref 6–20)
BUN: 56 mg/dL — ABNORMAL HIGH (ref 6–20)
BUN: 53 mg/dL — ABNORMAL HIGH (ref 6–20)
BUN: 56 mg/dL — ABNORMAL HIGH (ref 6–20)
BUN: 58 mg/dL — ABNORMAL HIGH (ref 6–20)
CO2: 25 mmol/L (ref 22–32)
CO2: 25 mmol/L (ref 22–32)
CO2: 26 mmol/L (ref 22–32)
CO2: 26 mmol/L (ref 22–32)
CO2: 24 mmol/L (ref 22–32)
Calcium: 8.7 mg/dL — ABNORMAL LOW (ref 8.9–10.3)
Calcium: 8.7 mg/dL — ABNORMAL LOW (ref 8.9–10.3)
Calcium: 8.7 mg/dL — ABNORMAL LOW (ref 8.9–10.3)
Calcium: 8.4 mg/dL — ABNORMAL LOW (ref 8.9–10.3)
Calcium: 8.2 mg/dL — ABNORMAL LOW (ref 8.9–10.3)
Chloride: 99 mmol/L (ref 98–111)
Chloride: 102 mmol/L (ref 98–111)
Chloride: 100 mmol/L (ref 98–111)
Chloride: 102 mmol/L (ref 98–111)
Chloride: 102 mmol/L (ref 98–111)
Creatinine, Ser: 3.04 mg/dL — ABNORMAL HIGH (ref 0.61–1.24)
Creatinine, Ser: 2.91 mg/dL — ABNORMAL HIGH (ref 0.61–1.24)
Creatinine, Ser: 3.02 mg/dL — ABNORMAL HIGH (ref 0.61–1.24)
Creatinine, Ser: 3.11 mg/dL — ABNORMAL HIGH (ref 0.61–1.24)
Creatinine, Ser: 3.08 mg/dL — ABNORMAL HIGH (ref 0.61–1.24)
GFR, Estimated: 23 mL/min — ABNORMAL LOW (ref >60)
GFR, Estimated: 24 mL/min — ABNORMAL LOW (ref >60)
GFR, Estimated: 23 mL/min — ABNORMAL LOW (ref >60)
GFR, Estimated: 22 mL/min — ABNORMAL LOW (ref >60)
GFR, Estimated: 22 mL/min — ABNORMAL LOW (ref >60)
Glucose, Bld: 290 mg/dL — ABNORMAL HIGH (ref 70–99)
Glucose, Bld: 156 mg/dL — ABNORMAL HIGH (ref 70–99)
Glucose, Bld: 152 mg/dL — ABNORMAL HIGH (ref 70–99)
Glucose, Bld: 248 mg/dL — ABNORMAL HIGH (ref 70–99)
Glucose, Bld: 236 mg/dL — ABNORMAL HIGH (ref 70–99)
Potassium: 3.2 mmol/L — ABNORMAL LOW (ref 3.5–5.1)
Potassium: 3.2 mmol/L — ABNORMAL LOW (ref 3.5–5.1)
Potassium: 3.2 mmol/L — ABNORMAL LOW (ref 3.5–5.1)
Potassium: 3.3 mmol/L — ABNORMAL LOW (ref 3.5–5.1)
Potassium: 3.3 mmol/L — ABNORMAL LOW (ref 3.5–5.1)
Sodium: 137 mmol/L (ref 135–145)
Sodium: 139 mmol/L (ref 135–145)
Sodium: 140 mmol/L (ref 135–145)
Sodium: 140 mmol/L (ref 135–145)
Sodium: 138 mmol/L (ref 135–145)

## 2023-11-03 LAB — VITAMIN D 25 HYDROXY (VIT D DEFICIENCY, FRACTURES): Vit D, 25-Hydroxy: 24.57 ng/mL — ABNORMAL LOW (ref 30–100)

## 2023-11-03 LAB — BETA-HYDROXYBUTYRIC ACID
Beta-Hydroxybutyric Acid: 0.53 mmol/L — ABNORMAL HIGH (ref 0.05–0.27)
Beta-Hydroxybutyric Acid: 0.09 mmol/L (ref 0.05–0.27)
Beta-Hydroxybutyric Acid: 0.07 mmol/L (ref 0.05–0.27)
Beta-Hydroxybutyric Acid: 0.21 mmol/L (ref 0.05–0.27)
Beta-Hydroxybutyric Acid: 0.07 mmol/L (ref 0.05–0.27)

## 2023-11-03 LAB — CBG MONITORING, ED
Glucose-Capillary: 212 mg/dL — ABNORMAL HIGH (ref 70–99)
Glucose-Capillary: 304 mg/dL — ABNORMAL HIGH (ref 70–99)
Glucose-Capillary: 373 mg/dL — ABNORMAL HIGH (ref 70–99)
Glucose-Capillary: 433 mg/dL — ABNORMAL HIGH (ref 70–99)
Glucose-Capillary: 445 mg/dL — ABNORMAL HIGH (ref 70–99)

## 2023-11-03 LAB — GLUCOSE, CAPILLARY
Glucose-Capillary: 169 mg/dL — ABNORMAL HIGH (ref 70–99)
Glucose-Capillary: 169 mg/dL — ABNORMAL HIGH (ref 70–99)
Glucose-Capillary: 173 mg/dL — ABNORMAL HIGH (ref 70–99)
Glucose-Capillary: 158 mg/dL — ABNORMAL HIGH (ref 70–99)
Glucose-Capillary: 142 mg/dL — ABNORMAL HIGH (ref 70–99)
Glucose-Capillary: 155 mg/dL — ABNORMAL HIGH (ref 70–99)
Glucose-Capillary: 171 mg/dL — ABNORMAL HIGH (ref 70–99)
Glucose-Capillary: 239 mg/dL — ABNORMAL HIGH (ref 70–99)
Glucose-Capillary: 192 mg/dL — ABNORMAL HIGH (ref 70–99)

## 2023-11-03 LAB — FOLATE: Folate: 16.9 ng/mL (ref >5.9)

## 2023-11-03 LAB — MAGNESIUM: Magnesium: 2.1 mg/dL (ref 1.7–2.4)

## 2023-11-03 LAB — BLOOD GAS, VENOUS
Acid-base deficit: 1.8 mmol/L (ref 0.0–2.0)
Bicarbonate: 23.7 mmol/L (ref 20.0–28.0)
O2 Saturation: 49 %
Patient temperature: 37
pCO2, Ven: 42 mmHg — ABNORMAL LOW (ref 44–60)
pH, Ven: 7.36 (ref 7.25–7.43)

## 2023-11-03 LAB — PHOSPHORUS: Phosphorus: 2.3 mg/dL — ABNORMAL LOW (ref 2.5–4.6)

## 2023-11-03 LAB — MRSA NEXT GEN BY PCR, NASAL: MRSA by PCR Next Gen: DETECTED — AB

## 2023-11-03 MED ORDER — METOCLOPRAMIDE HCL 5 MG/ML IJ SOLN
10.0000 mg | Freq: Four times a day (QID) | INTRAMUSCULAR | Status: AC
  Administered 2023-11-03 (×2): 10 mg via INTRAVENOUS
  Filled 2023-11-03 (×2): qty 2

## 2023-11-03 MED ORDER — MUPIROCIN 2 % EX OINT
1.0000 | TOPICAL_OINTMENT | Freq: Two times a day (BID) | CUTANEOUS | Status: AC
  Administered 2023-11-03 – 2023-11-07 (×9): 1 via NASAL
  Filled 2023-11-03: qty 22

## 2023-11-03 MED ORDER — GLUCERNA SHAKE PO LIQD
237.0000 mL | Freq: Three times a day (TID) | ORAL | Status: DC
Start: 2023-11-03 — End: 2023-11-12
  Administered 2023-11-03 – 2023-11-11 (×16): 237 mL via ORAL

## 2023-11-03 MED ORDER — SODIUM CHLORIDE 0.9 % IV SOLN
12.5000 mg | Freq: Four times a day (QID) | INTRAVENOUS | Status: DC | PRN
  Administered 2023-11-03 – 2023-11-04 (×3): 12.5 mg via INTRAVENOUS
  Filled 2023-11-03 (×2): qty 12.5
  Filled 2023-11-03 (×2): qty 0.5
  Filled 2023-11-03: qty 12.5

## 2023-11-03 MED ORDER — MORPHINE SULFATE (PF) 2 MG/ML IV SOLN
2.0000 mg | INTRAVENOUS | Status: DC | PRN
  Administered 2023-11-03 – 2023-11-04 (×5): 2 mg via INTRAVENOUS
  Filled 2023-11-03 (×5): qty 1

## 2023-11-03 MED ORDER — INSULIN GLARGINE 100 UNIT/ML ~~LOC~~ SOLN
20.0000 [IU] | Freq: Every day | SUBCUTANEOUS | Status: DC
  Administered 2023-11-03: 20 [IU] via SUBCUTANEOUS
  Filled 2023-11-03 (×2): qty 0.2

## 2023-11-03 MED ORDER — ACETAMINOPHEN 325 MG PO TABS
650.0000 mg | ORAL_TABLET | Freq: Four times a day (QID) | ORAL | Status: DC | PRN
  Administered 2023-11-05 – 2023-11-09 (×4): 650 mg via ORAL
  Filled 2023-11-03 (×4): qty 2

## 2023-11-03 MED ORDER — SODIUM CHLORIDE 0.9 % IV SOLN: INTRAVENOUS | Status: DC

## 2023-11-03 MED ORDER — ENOXAPARIN SODIUM 40 MG/0.4ML IJ SOSY
40.0000 mg | PREFILLED_SYRINGE | INTRAMUSCULAR | Status: DC
  Administered 2023-11-03 – 2023-11-04 (×2): 40 mg via SUBCUTANEOUS
  Filled 2023-11-03 (×2): qty 0.4

## 2023-11-03 MED ORDER — OXYCODONE HCL 5 MG PO TABS
5.0000 mg | ORAL_TABLET | Freq: Four times a day (QID) | ORAL | Status: DC | PRN
  Filled 2023-11-03: qty 1

## 2023-11-03 MED ORDER — K PHOS MONO-SOD PHOS DI & MONO 155-852-130 MG PO TABS
500.0000 mg | ORAL_TABLET | Freq: Four times a day (QID) | ORAL | Status: AC
  Administered 2023-11-03 (×3): 500 mg via ORAL
  Filled 2023-11-03 (×4): qty 2

## 2023-11-03 MED ORDER — CHLORHEXIDINE GLUCONATE CLOTH 2 % EX PADS
6.0000 | MEDICATED_PAD | Freq: Every day | CUTANEOUS | Status: DC
  Administered 2023-11-03 – 2023-11-11 (×8): 6 via TOPICAL
  Filled 2023-11-03: qty 6

## 2023-11-03 MED ORDER — PANTOPRAZOLE SODIUM 40 MG IV SOLR
40.0000 mg | INTRAVENOUS | Status: DC
  Administered 2023-11-03 – 2023-11-04 (×3): 40 mg via INTRAVENOUS
  Filled 2023-11-03 (×3): qty 10

## 2023-11-03 MED ORDER — ORAL CARE MOUTH RINSE: 15.0000 mL | OROMUCOSAL | Status: DC | PRN

## 2023-11-03 MED ORDER — INSULIN ASPART 100 UNIT/ML IJ SOLN
0.0000 [IU] | Freq: Three times a day (TID) | INTRAMUSCULAR | Status: DC
  Administered 2023-11-03: 3 [IU] via SUBCUTANEOUS
  Administered 2023-11-03: 5 [IU] via SUBCUTANEOUS
  Administered 2023-11-04: 11 [IU] via SUBCUTANEOUS
  Administered 2023-11-04: 8 [IU] via SUBCUTANEOUS
  Administered 2023-11-04: 5 [IU] via SUBCUTANEOUS
  Administered 2023-11-06: 2 [IU] via SUBCUTANEOUS
  Administered 2023-11-06 (×2): 8 [IU] via SUBCUTANEOUS
  Filled 2023-11-03 (×8): qty 1

## 2023-11-03 NOTE — Progress Notes (Signed)
 [facility] Progress Note  Patient: Jason Jennings    FMW:[id number]  DOA: 11/02/2023     Date of Service: the patient was seen and examined on 11/03/2023  Chief Complaint  Patient presents with   Emesis   Hyperglycemia   Brief hospital course: Jason Jennings is a 58 y.o. 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   He presented with 3 days of nausea, nonbloody, nonbilious, non-coffee-ground vomiting and left upper abdominal pain .  Denies fever or chills, cough or chest pain or change in bowel habits.  Emesis is nonbloody and nonbilious.  Has a history of pancreatitis about 12 years prior.  Denies alcohol use. In the ED tachycardic to 115 with otherwise normal vitals. Labs notable for lipase 300 with LFTs notable for AST 122 otherwise normal. Glucose 513 with anion gap of 23, bicarb 17, venous pH 7.36 and beta hydroxybutyric acid of 3.74. Creatinine 3.29, up from baseline of 2.27 WBC 14.8 and hemoglobin 11.8(Baseline 10-11) UA pending CT abdomen and pelvis for the most part nonacute-please see full report   Patient started on IV hydration, insulin  infusion and potassium replacement per Endo tool. Admission requested.    Assessment and Plan:  # Diabetic ketoacidosis without coma associated with type 2 diabetes mellitus (HCC) S/p insulin  IV infusion and IVF as per DKA protocol Anion gap closed, transition to subcu insulin  9/26 started Lantus  20 units subcu daily NovoLog  sliding scale Monitor CBG, continue diabetic diet    # Acute pancreatitis # Intractable nausea and vomiting due to pancreatitis vs DKA vs gastroparesis Elevated lipase Mild elevation in lipase could  be reactive Zofran  with Phenergan  for breakthrough S/p Reglan  10 mg q6 x 2 doses given  IV Protonix  IV hydration     # Hypokalemia:  Replete cautiously due to renal failure # Hypophosphatemia: repleted orally Check electrolytes daily  # Acute renal failure superimposed on stage 3b chronic kidney disease (HCC) Likely ATN from volume depletion related to fluid losses from vomiting Continue IV fluid resuscitation Expecting improvement to baseline with treatment   # 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  Interventions:  Diet: Clear liquid diet DVT Prophylaxis: Subcutaneous Lovenox    Advance goals of care discussion: Full code  Family Communication: family was present at bedside, at the time of interview.  The pt provided permission to discuss medical plan with the family. Opportunity was given to ask question and all questions were answered satisfactorily.   Disposition:  Pt is from Home, admitted with DKA, still has uncontrolled blood glucose, which precludes a safe discharge. Discharge to home, when stable, most likely in 1 to 2 days.  Subjective: No significant events overnight, patient is still complaining of nausea, no vomiting has not eaten for the past few days.  Abdominal pain is 7/10 in the epigastric area, denied any other complaints.  Physical Exam: General: NAD, lying comfortably Appear in no distress, affect appropriate Eyes: PERRLA ENT: Oral Mucosa Clear, moist  Neck: no JVD,  Cardiovascular: S1 and S2 Present, no  Murmur,  Respiratory: good respiratory effort, Bilateral Air entry equal and Decreased, no Crackles, no wheezes Abdomen: Bowel Sound present, Soft and mild epigastric/mid abdomen tenderness,  Skin: no  rashes Extremities: no Pedal edema, no calf tenderness Neurologic: without any new focal findings Gait not checked due to patient safety concerns  Vitals:   11/03/23 1100 11/03/23 1200 11/03/23 1300 11/03/23 1400  BP: 122/81 119/79 (!) 136/92 (!) 142/94  Pulse: (!) 110 (!) 110 (!) 111 (!) 115  Resp: (!) 21 19 17 14   Temp:  98.7 F (37.1 C)    TempSrc:  Oral    SpO2: 94% 98% 97% 99%  Weight:      Height:        Intake/Output Summary (Last 24 hours) at 11/03/2023 1441 Last data filed at 11/03/2023 1300 Gross per 24 hour  Intake 2321.56 ml  Output 450 ml  Net 1871.56 ml   Filed Weights   11/02/23 2355  Weight: 91.4 kg    Data Reviewed: I have personally reviewed and interpreted daily labs, tele strips, imagings as discussed above. I reviewed all nursing notes, pharmacy notes, vitals, pertinent old records I have discussed plan of care as described above with RN and patient/family.  CBC: Recent Labs  Lab 11/02/23 2115  WBC 14.8*  HGB 11.8*  HCT 36.4*  MCV 87.9  PLT 221   Basic Metabolic Panel: Recent Labs  Lab 11/02/23 2115 11/03/23 0359 11/03/23 0646 11/03/23 1021  NA 134* 137 139 140  K 3.8 3.2* 3.2* 3.2*  CL 94* 99 102 100  CO2 17* 25 25 26   GLUCOSE 513* 290* 156* 152*  BUN 61* 59* 56* 53*  CREATININE 3.29* 3.04* 2.91* 3.02*  CALCIUM  8.8* 8.7* 8.7* 8.7*  MG  --   --   --  2.1  PHOS  --   --   --  2.3*    Studies: CT ABDOMEN PELVIS WO CONTRAST Result Date: 11/02/2023 CLINICAL DATA:  Acute severe pancreatitis with vomiting since Monday. Abdominal pain around the osteotomy with increased output. History of diabetes with increased blood sugar. EXAM: CT ABDOMEN AND PELVIS WITHOUT CONTRAST TECHNIQUE: Multidetector CT imaging of the abdomen and pelvis was performed following the standard protocol without IV contrast. RADIATION DOSE REDUCTION: This exam was performed according to the departmental dose-optimization program which includes automated exposure  control, adjustment of the mA and/or kV according to patient size and/or use of iterative reconstruction technique. COMPARISON:  CT chest abdomen and pelvis 03/27/2023 FINDINGS: Lower chest: Small bilateral pleural effusions with atelectasis in the lung bases. Hepatobiliary: No focal liver abnormality is seen. No gallstones, gallbladder wall thickening, or biliary dilatation. Pancreas: Unremarkable. No pancreatic ductal dilatation or surrounding inflammatory changes. Spleen: Focal lesion in the anterior superior spleen tip measuring 2.3 cm diameter. No change since prior study. This probably represents a hemangioma. Adrenals/Urinary Tract: No adrenal gland nodules. Kidneys are symmetrical. No renal, ureteral, or bladder stones. No hydronephrosis or hydroureter. Bladder is normal. Stomach/Bowel: Stomach, small bowel, and colon are not abnormally distended. Rectosigmoid resection with left lower quadrant descending colostomy. Presacral soft tissue thickening and stranding is similar to prior study, likely postoperative or post treatment changes. Peristomal hernia containing fat and small bowel. No proximal obstruction. Appendix is normal. Vascular/Lymphatic: Aortic atherosclerosis. No enlarged abdominal or pelvic lymph nodes. Reproductive: Prostate gland appears to be surgically absent. No pelvic mass identified. Other: No free air or free fluid in the abdomen. Musculoskeletal: Degenerative changes in the spine. Anterior compression of the  T10 and 11 vertebral bodies. No change since prior study. IMPRESSION: 1. Small bilateral pleural effusions with basilar atelectasis. 2. Rectosigmoid resection with left lower quadrant colostomy. Peristomal hernia containing small bowel without proximal obstruction. 3. Presacral soft tissue prominence likely representing post treatment changes. No change since prior study. 4. Pancreas appears intact. No acute inflammatory changes identified. 5. Unchanged appearance of splenic lesion  likely hemangioma. 6. Mild aortic atherosclerosis. Electronically Signed   By: Elsie Gravely M.D.   On: 11/02/2023 23:39    Scheduled Meds:  Chlorhexidine  Gluconate Cloth  6 each Topical Daily   enoxaparin  (LOVENOX ) injection  40 mg Subcutaneous Q24H   feeding supplement (GLUCERNA SHAKE)  237 mL Oral TID BM   insulin  aspart  0-15 Units Subcutaneous TID WC   insulin  glargine  20 Units Subcutaneous Daily   mupirocin  ointment  1 Application Nasal BID   pantoprazole  (PROTONIX ) IV  40 mg Intravenous Q24H   phosphorus  500 mg Oral QID   Continuous Infusions:  sodium chloride  40 mL/hr at 11/03/23 1300   insulin  Stopped (11/03/23 1251)   promethazine  (PHENERGAN ) injection (IM or IVPB) 12.5 mg (11/03/23 1411)   PRN Meds: acetaminophen , dextrose , morphine  injection, mouth rinse, oxyCODONE , promethazine  (PHENERGAN ) injection (IM or IVPB)  Time spent: 55 minutes  Author: ELVAN SOR. MD [facility] 11/03/2023 2:41 PM  To reach On-call, see care teams to locate the attending and reach out to them via [email or web address]. If 7PM-7AM, please contact night-coverage If you still have difficulty reaching the attending provider, please page the [facility] (Director on Call) for [facility] on amion for assistance.

## 2023-11-03 NOTE — Hospital Course (Addendum)
 [name]

## 2023-11-03 NOTE — H&P (Signed)
 History and Physical    Patient: Jason Jennings FMW:[id number] DOB: 1965-07-15 DOA: 11/02/2023 DOS: the patient was seen and examined on 11/03/2023 PCP: Salli Amato, MD  Patient coming from: Home  Chief Complaint:  Chief Complaint  Patient presents with   Emesis   Hyperglycemia    HPI: Jason Jennings is a 58 y.o. 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   He presented with 3 days of nausea, nonbloody, nonbilious, non-coffee-ground vomiting and left upper abdominal pain .  Denies fever or chills, cough or chest pain or change in bowel habits.  Emesis is nonbloody and nonbilious.  Has a history of pancreatitis about 12 years prior.  Denies alcohol use. In the ED tachycardic to 115 with otherwise normal vitals. Labs notable for lipase 300 with LFTs notable for AST 122 otherwise normal. Glucose 513 with anion gap of 23, bicarb 17, venous pH 7.36 and beta hydroxybutyric acid of 3.74. Creatinine 3.29, up from baseline of 2.27 WBC 14.8 and hemoglobin 11.8(Baseline 10-11) UA pending CT abdomen and pelvis for the most part nonacute-please see full report  Patient started on IV hydration, insulin  infusion and potassium replacement per Endo tool. Admission requested.     Review of Systems: As mentioned in the history of present illness. All other systems reviewed and are negative.  Past Medical History:  Diagnosis Date   Adenocarcinoma of rectum (HCC)    Adenocarcinoma of rectum (HCC)    Cancer (HCC)    Chronic kidney disease, stage 3b (HCC)    Diabetes mellitus due to underlying condition with diabetic autonomic neuropathy, unspecified whether long term insulin  use (HCC)     Diabetes mellitus without complication (HCC)    Diabetic polyneuropathy (HCC)    Diabetic retinopathy (HCC)    Neuropathy    S/P colostomy (HCC)    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)    Ulcer of left foot with fat layer exposed [facility])    Past Surgical History:  Procedure Laterality Date   CATARACT EXTRACTION W/PHACO Right 06/01/2023   Procedure: PHACOEMULSIFICATION, CATARACT, WITH IOL INSERTION 7.85, 00:57.4;  Surgeon: Enola Feliciano Hugger, MD;  Location: [facility];  Service: Ophthalmology;  Laterality: Right;   CATARACT EXTRACTION W/PHACO Left 06/15/2023   Procedure: PHACOEMULSIFICATION, CATARACT, WITH IOL INSERTION 7.15 00:40.8;  Surgeon: Enola Feliciano Hugger, MD;  Location: [facility];  Service: Ophthalmology;  Laterality: Left;   COLON SURGERY     Social History:  reports that he quit smoking about 8 years ago. His smoking use included cigarettes. He has never used smokeless tobacco. He reports that he does not currently use alcohol. He reports that he does not currently use drugs after having used the following drugs: Marijuana.  No Known Allergies  History reviewed. No pertinent family history.  Prior to Admission medications   Medication Sig Start Date End Date Taking? Authorizing Provider  acetaminophen  (TYLENOL ) 325 MG tablet Take 2 tablets (650 mg total) by mouth every 6 (six) hours as needed for mild pain (pain score 1-3) or fever (or Fever >/= 101). 08/20/23   Alexander, Natalie, DO  amLODipine  (NORVASC ) 5 MG tablet Take 1 tablet (5 mg total) by mouth daily. 08/21/23   Alexander, Natalie, DO  Continuous Glucose Receiver (  FREESTYLE LIBRE 2 READER) DEVI 1 Box by Does not apply route 2 (two) times daily. 08/15/23   Dorinda Drue DASEN, MD  Continuous Glucose Receiver (FREESTYLE LIBRE 3 READER) DEVI 1 Box by Does not apply route 2 (two) times daily. 08/15/23   Dorinda Drue DASEN, MD  Continuous Glucose Sensor (FREESTYLE LIBRE 3 PLUS SENSOR) MISC Change  sensor every 15 days. 08/15/23   Dorinda Drue DASEN, MD  Continuous Glucose Sensor (FREESTYLE LIBRE 3 PLUS SENSOR) MISC Change sensor every 15 days. 08/15/23   Dorinda Drue DASEN, MD  fluticasone-salmeterol (ADVAIR) 250-50 MCG/ACT AEPB Inhale 1 puff into the lungs 2 (two) times daily. 07/26/23   [provider]  gabapentin  (NEURONTIN ) 100 MG capsule Take 100 mg by mouth 3 (three) times daily. 08/21/18   [provider]  gentamicin  ointment (GARAMYCIN ) 0.1 % Apply topically daily. To L foot ulcer until healed 08/21/23   Alexander, Natalie, DO  HUMALOG  MIX 75/25 KWIKPEN (75-25) 100 UNIT/ML KwikPen Inject 30 Units into the skin 2 (two) times daily with a meal. Qam and in the evening.      (He takes both Humalog  mix 75/25 BID and then Lantus  at bedtime) Pt reports taking to 25 units BID    [provider]  insulin  glargine (LANTUS  SOLOSTAR) 100 UNIT/ML Solostar Pen Inject 14 Units into the skin daily. Pt reports taking 12 units once daily    [provider]  losartan  (COZAAR ) 50 MG tablet Take 50 mg by mouth daily. Patient not taking: Reported on 09/14/2023    [provider]  methocarbamol  (ROBAXIN ) 750 MG tablet Take 750 mg by mouth 2 (two) times daily as needed. 12/28/22   [provider]  metoprolol  tartrate (LOPRESSOR ) 25 MG tablet Take 1 tablet (25 mg total) by mouth 2 (two) times daily. 08/20/23   Alexander, Natalie, DO  Oxycodone  HCl 10 MG TABS Take 10 mg by mouth every 6 (six) hours as needed. Patient not taking: Reported on 09/14/2023 12/23/22   [provider]  rosuvastatin  (CRESTOR ) 20 MG tablet Take 20 mg by mouth daily. 01/24/18   [provider]  tamsulosin  (FLOMAX ) 0.4 MG CAPS capsule Take 0.4 mg by mouth daily.    [provider]  testosterone cypionate (DEPOTESTOTERONE CYPIONATE) 100 MG/ML injection INJECT 1/2 (ONE-HALF) ML INTRAMUSCULARLY EVERY TWO WEEKS (DISCARD VIAL AFTER 30 DAYS) 03/29/23   [provider]   torsemide (DEMADEX) 10 MG tablet Take 10 mg by mouth every Monday, Wednesday, Friday, and Saturday at 6 PM. Patient not taking: Reported on 09/14/2023    [provider]    Physical Exam: Vitals:   11/02/23 2107 11/02/23 2355  BP: (!) 137/94   Pulse: (!) 115   Resp: 18   Temp: 98.7 F (37.1 C)   TempSrc: Oral   SpO2: 97%   Weight:  91.4 kg  Height:  5' 6 (1.676 m)   Physical Exam Vitals and nursing note reviewed.  Constitutional:      General: He is not in acute distress.    Appearance: He is ill-appearing.  HENT:     Head: Normocephalic and atraumatic.  Cardiovascular:     Rate and Rhythm: Regular rhythm. Tachycardia present.     Heart sounds: Normal heart sounds.  Pulmonary:     Effort: Pulmonary effort is normal.     Breath sounds: Normal breath sounds.  Abdominal:     Palpations: Abdomen is soft.     Tenderness: There is no abdominal tenderness.  Neurological:  Mental Status: Mental status is at baseline.     Labs on Admission: I have personally reviewed following labs and imaging studies  CBC: Recent Labs  Lab 11/02/23 2115  WBC 14.8*  HGB 11.8*  HCT 36.4*  MCV 87.9  PLT 221   Basic Metabolic Panel: Recent Labs  Lab 11/02/23 2115  NA 134*  K 3.8  CL 94*  CO2 17*  GLUCOSE 513*  BUN 61*  CREATININE 3.29*  CALCIUM  8.8*   GFR: Estimated Creatinine Clearance: 25.6 mL/min (A) (by C-G formula based on SCr of 3.29 mg/dL (H)). Liver Function Tests: Recent Labs  Lab 11/02/23 2315  AST 122*  ALT 40  ALKPHOS 78  BILITOT 1.1  PROT 6.2*  ALBUMIN 2.6*   Recent Labs  Lab 11/02/23 2115  LIPASE 300*   No results for input(s): AMMONIA in the last 168 hours. Coagulation Profile: No results for input(s): INR, PROTIME in the last 168 hours. Cardiac Enzymes: No results for input(s): CKTOTAL, CKMB, CKMBINDEX, TROPONINI in the last 168 hours. BNP (last 3 results) No results for input(s): PROBNP in the last 8760  hours. HbA1C: No results for input(s): HGBA1C in the last 72 hours. CBG: Recent Labs  Lab 11/02/23 2110 11/03/23 0051 11/03/23 0150  GLUCAP 460* 433* 445*   Lipid Profile: No results for input(s): CHOL, HDL, LDLCALC, TRIG, CHOLHDL, LDLDIRECT in the last 72 hours. Thyroid Function Tests: No results for input(s): TSH, T4TOTAL, FREET4, T3FREE, THYROIDAB in the last 72 hours. Anemia Panel: No results for input(s): VITAMINB12, FOLATE, FERRITIN, TIBC, IRON, RETICCTPCT in the last 72 hours. Urine analysis:    Component Value Date/Time   COLORURINE YELLOW (A) 08/13/2023 1030   APPEARANCEUR CLOUDY (A) 08/13/2023 1030   LABSPEC 1.017 08/13/2023 1030   PHURINE 5.0 08/13/2023 1030   GLUCOSEU >=500 (A) 08/13/2023 1030   HGBUR SMALL (A) 08/13/2023 1030   BILIRUBINUR NEGATIVE 08/13/2023 1030   KETONESUR NEGATIVE 08/13/2023 1030   PROTEINUR >=300 (A) 08/13/2023 1030   NITRITE NEGATIVE 08/13/2023 1030   LEUKOCYTESUR NEGATIVE 08/13/2023 1030    Radiological Exams on Admission: CT ABDOMEN PELVIS WO CONTRAST Result Date: 11/02/2023 CLINICAL DATA:  Acute severe pancreatitis with vomiting since Monday. Abdominal pain around the osteotomy with increased output. History of diabetes with increased blood sugar. EXAM: CT ABDOMEN AND PELVIS WITHOUT CONTRAST TECHNIQUE: Multidetector CT imaging of the abdomen and pelvis was performed following the standard protocol without IV contrast. RADIATION DOSE REDUCTION: This exam was performed according to the departmental dose-optimization program which includes automated exposure control, adjustment of the mA and/or kV according to patient size and/or use of iterative reconstruction technique. COMPARISON:  CT chest abdomen and pelvis 03/27/2023 FINDINGS: Lower chest: Small bilateral pleural effusions with atelectasis in the lung bases. Hepatobiliary: No focal liver abnormality is seen. No gallstones, gallbladder wall thickening, or  biliary dilatation. Pancreas: Unremarkable. No pancreatic ductal dilatation or surrounding inflammatory changes. Spleen: Focal lesion in the anterior superior spleen tip measuring 2.3 cm diameter. No change since prior study. This probably represents a hemangioma. Adrenals/Urinary Tract: No adrenal gland nodules. Kidneys are symmetrical. No renal, ureteral, or bladder stones. No hydronephrosis or hydroureter. Bladder is normal. Stomach/Bowel: Stomach, small bowel, and colon are not abnormally distended. Rectosigmoid resection with left lower quadrant descending colostomy. Presacral soft tissue thickening and stranding is similar to prior study, likely postoperative or post treatment changes. Peristomal hernia containing fat and small bowel. No proximal obstruction. Appendix is normal. Vascular/Lymphatic: Aortic atherosclerosis. No enlarged abdominal or pelvic lymph  nodes. Reproductive: Prostate gland appears to be surgically absent. No pelvic mass identified. Other: No free air or free fluid in the abdomen. Musculoskeletal: Degenerative changes in the spine. Anterior compression of the T10 and 11 vertebral bodies. No change since prior study. IMPRESSION: 1. Small bilateral pleural effusions with basilar atelectasis. 2. Rectosigmoid resection with left lower quadrant colostomy. Peristomal hernia containing small bowel without proximal obstruction. 3. Presacral soft tissue prominence likely representing post treatment changes. No change since prior study. 4. Pancreas appears intact. No acute inflammatory changes identified. 5. Unchanged appearance of splenic lesion likely hemangioma. 6. Mild aortic atherosclerosis. Electronically Signed   By: Elsie Gravely M.D.   On: 11/02/2023 23:39   Data Reviewed for HPI: Relevant notes from primary care and specialist visits, past discharge summaries as available in EHR, including Care Everywhere. Prior diagnostic testing as pertinent to current admission diagnoses Updated  medications and problem lists for reconciliation ED course, including vitals, labs, imaging, treatment and response to treatment Triage notes, nursing and pharmacy notes and ED provider's notes Notable results as noted above in HPI      Assessment and Plan: * Diabetic ketoacidosis without coma associated with type 2 diabetes mellitus (HCC) Continue IV fluids, insulin  infusion and potassium repletion per Endo tool Transition into subcu when gap is closed and less acidotic  Intractable nausea and vomiting Elevated lipase, possible pancreatitis Suspect related to DKA, possible gastroparesis Mild elevation in lipase could be reactive Zofran  with Phenergan  for breakthrough Will add Reglan  for couple doses IV Protonix  IV hydration   Acute renal failure superimposed on stage 3b chronic kidney disease (HCC) Likely ATN from volume depletion related to fluid losses from vomiting Continue IV fluid resuscitation Expecting improvement to baseline with treatment  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 Inpatient status is judged to be reasonable and necessary in order to provide the required intensity of service to ensure the patient's safety. The patient's presenting symptoms, physical exam findings, and initial radiographic and laboratory data in the context of their chronic comorbidities is felt to place them at high risk for further clinical deterioration. Furthermore, it is not anticipated that the patient will be medically  stable for discharge from the hospital within 2 midnights of admission.   * I certify that at the point of admission it is my clinical judgment that the patient will require inpatient hospital care spanning beyond 2 midnights from the point of admission due to high intensity of service, high risk for further deterioration and high frequency of surveillance required.*  Author: Delayne LULLA Solian, MD 11/03/2023 2:39 AM  For on call review [email or web address].

## 2023-11-03 NOTE — Inpatient Diabetes Management (Addendum)
 Inpatient Diabetes Program Recommendations  AACE/ADA: New Consensus Statement on Inpatient Glycemic Control   Target Ranges:  Prepandial:   less than 140 mg/dL      Peak postprandial:   less than 180 mg/dL (1-2 hours)      Critically ill patients:  140 - 180 mg/dL    Latest Reference Range & Units 11/03/23 01:50 11/03/23 02:43 11/03/23 03:58 11/03/23 05:25 11/03/23 06:06 11/03/23 07:34 11/03/23 08:36 11/03/23 09:37 11/03/23 10:40  Glucose-Capillary 70 - 99 mg/dL 554 (H) 626 (H) 695 (H) 212 (H) 169 (H) 169 (H) 173 (H) 158 (H) 142 (H)    Latest Reference Range & Units 11/02/23 21:15 11/03/23 03:59 11/03/23 06:46 11/03/23 10:21  CO2 22 - 32 mmol/L 17 (L) 25 25 26   Glucose 70 - 99 mg/dL 486 (HH) 709 (H) 843 (H) 152 (H)  BUN 6 - 20 mg/dL 61 (H) 59 (H) 56 (H) 53 (H)  Creatinine 0.61 - 1.24 mg/dL 6.70 (H) 6.95 (H) 7.08 (H) 3.02 (H)  Calcium  8.9 - 10.3 mg/dL 8.8 (L) 8.7 (L) 8.7 (L) 8.7 (L)  Anion gap 5 - 15  23 (H) 13 12 14     Latest Reference Range & Units 11/02/23 21:15  Lipase 11 - 51 U/L 300 (H)    Latest Reference Range & Units 11/02/23 22:16 11/03/23 03:59 11/03/23 06:46 11/03/23 10:21  Beta-Hydroxybutyric Acid 0.05 - 0.27 mmol/L 3.74 (H) 0.53 (H) 0.09 0.07    Latest Reference Range & Units 01/28/23 16:35 08/12/23 15:34  Hemoglobin A1C 4.8 - 5.6 % 11.4 (H) 11.0 (H)   Review of Glycemic Control  Diabetes history: DM2 Outpatient Diabetes medications: Lantus  14 units daily, Humalog  75/25 30 units BID, FreeStyle Libre 3 CGM Current orders for Inpatient glycemic control: IV insulin ; transitioned to Lantus  20 units daily, Novolog  0-15 units TID with meals  Inpatient Diabetes Program Recommendations:    Insulin : Noted patient being transitioned from IV to SQ insulin  and was given Lantus  20  units at 10:36 am today. Please consider adding Novolog  0-5 units at bedtime.  HbgA1C: A1C 11.0% on 08/12/23 indicating an average glucose of 269 mg/dl over the past 2-3 months.  NOTE: Patient  admitted with N/V, elevated lipase, possible pancreatitis, DKA, acute renal failure, hx of adenocarcinoma of rectum s/p resection with colostomy. Initial glucose 513 mg/dl on 0/74/74 at 78:84 and patient was ordered IV insulin . Patient was given Lantus  20 units at 10:36 am today. Patient was recently inpatient 08/12/23-08/20/23 and was seen by inpatient diabetes coordinator on 08/14/23 and 08/15/23 (started on FreeStyle Libre 3 CGM sensors on 08/15/23).  Will follow along.  Addendum 11/03/23@13 :55-Spoke with patient at bedside regarding DM and DM medications. Patient reports that he was taking Lantus  14 units daily and Humalog  75/25 30 units BID along with FreeStyle Libre 3 Plus CGM sensors and reader. Patient states that he started getting sick on Monday and he did not take any insulin  due to fear of going low. Patient also reports that he did not have on a CGM sensor because the one he had put on came off after 2 days so he was going to wait for the 14 days it should have lasted before starting a new sensor. Patient reports that he has a 90 day supply of FreeStyle Libre 3 sensors at home and he has plenty of Humalog  75/25 and Lantus  insulin  at home. Discussed importance of taking insulin  even when not able to eat. Encouraged patient to talk with his PCP about adjusting dose of insulin   during times when he is not able to eat. Discussed that if he skips insulin , he is at high risk of developing DKA. Discussed that if a CGM sensor comes off before the 14 days, he needs to call [employer] to let them know and they should send him a replacement sensor. Asked that he always put a new sensor on if one comes off early. Discussed using skin tack or adhesive covering if needed to help the sensor stay on for the 14 days. Encouraged patient to follow up with PCP in the near future so they can assist with insulin  adjustments if needed and guidance on sick day doses. Patient verbalized understanding and has no questions at this  time.  Thanks, Earnie Gainer, RN, MSN, CDCES Diabetes Coordinator Inpatient Diabetes Program [phone] (Team Pager from 8am to 5pm)

## 2023-11-03 NOTE — Assessment & Plan Note (Signed)
 Colostomal hernia No acute issues suspected

## 2023-11-03 NOTE — Assessment & Plan Note (Signed)
 Hemoglobin stable

## 2023-11-03 NOTE — Assessment & Plan Note (Signed)
 Continue IV fluids, insulin  infusion and potassium repletion per Endo tool Transition into subcu when gap is closed and less acidotic

## 2023-11-03 NOTE — Plan of Care (Signed)
  Problem: Education: Goal: Ability to describe self-care measures that may prevent or decrease complications (Diabetes Survival Skills Education) will improve Outcome: Progressing   Problem: Coping: Goal: Ability to adjust to condition or change in health will improve Outcome: Progressing   Problem: Fluid Volume: Goal: Ability to maintain a balanced intake and output will improve Outcome: Progressing   Problem: Metabolic: Goal: Ability to maintain appropriate glucose levels will improve Outcome: Progressing   Problem: Nutritional: Goal: Maintenance of adequate nutrition will improve Outcome: Progressing   Problem: Metabolic: Goal: Ability to maintain appropriate glucose levels will improve Outcome: Progressing   Problem: Nutritional: Goal: Maintenance of adequate nutrition will improve Outcome: Progressing   Problem: Pain Managment: Goal: General experience of comfort will improve and/or be controlled Outcome: Progressing

## 2023-11-03 NOTE — Progress Notes (Signed)
 Anticoagulation monitoring(Lovenox ):  58 yo male ordered Lovenox  30 mg Q24h    Filed Weights   11/02/23 2355  Weight: 91.4 kg (201 lb 6.4 oz)   BMI 32.5    Lab Results  Component Value Date   CREATININE 3.29 (H) 11/02/2023   CREATININE 2.85 (H) 08/20/2023   CREATININE 2.76 (H) 08/19/2023   Estimated Creatinine Clearance: 25.6 mL/min (A) (by C-G formula based on SCr of 3.29 mg/dL (H)). Hemoglobin & Hematocrit     Component Value Date/Time   HGB 11.8 (L) 11/02/2023 2115   HCT 36.4 (L) 11/02/2023 2115     Per Protocol for Patient with estCrcl < 30 ml/min and BMI > 30, will transition to Lovenox  40 mg Q24h.

## 2023-11-03 NOTE — Assessment & Plan Note (Signed)
 Elevated lipase, possible pancreatitis Suspect related to DKA, possible gastroparesis Mild elevation in lipase could be reactive Zofran  with Phenergan  for breakthrough Will add Reglan  for couple doses IV Protonix  IV hydration

## 2023-11-03 NOTE — Assessment & Plan Note (Signed)
 Hydralazine as needed  Resume home meds once patient able to tolerate

## 2023-11-03 NOTE — TOC Initial Note (Signed)
 Transition of Care [facility]) - Initial/Assessment Note    Patient Details  Name: Jason Jennings MRN: [id number] Date of Birth: [birth year]  Transition of Care [facility]) CM/SW Contact:    Corrie JINNY Ruts, LCSW Phone Number: 11/03/2023, 3:24 PM  Clinical Narrative:                 Chart reviewed. The patient was admitted for Diabetic Ketoacidosis. I spoke with the patient at bedside today. I introduced myself, my role, and reason for consult. The patient reports that he was ok. The patient confirms that he has a PCP. The patient reports that he lives in the home by himself. The patient reports that he is able to complete daily living task independently. The patient reports that he drives himself to medical appointments. The patient reports that his sister, brother in law, or daughter will assist during discharge. The patient reports that he uses [facility] for a pharmacy. The patient reports that he has had HH in the past but does not remember the agency. The patient reports that [facility] was at the beginning of the year.   The patient reports that he has been a resident [facility] 2 or 3 years ago. The patient reports that he has a walker, cane, and wheel chair in the home.   There are no TOC needs at this time. TOC will follow the patient until discharge.     Barriers to Discharge: Continued Medical Work up   Patient Goals and CMS Choice            Expected Discharge Plan and Services       Living arrangements for the past 2 months: Single Family Home                                      Prior Living Arrangements/Services Living arrangements for the past 2 months: Single Family Home Lives with:: Self Patient language and need for interpreter reviewed:: Yes        Need for Family Participation in Patient Care: Yes (Comment)     Criminal Activity/Legal Involvement Pertinent to Current Situation/Hospitalization: No - Comment as needed  Activities of Daily Living   ADL Screening  (condition at time of admission) Independently performs ADLs?: Yes (appropriate for developmental age) Is the patient deaf or have difficulty hearing?: No Does the patient have difficulty seeing, even when wearing glasses/contacts?: No Does the patient have difficulty concentrating, remembering, or making decisions?: No  Permission Sought/Granted                  Emotional Assessment Appearance:: Appears stated age Attitude/Demeanor/Rapport: Engaged Affect (typically observed): Calm, Pleasant Orientation: : Oriented to Self, Oriented to Place, Oriented to  Time, Oriented to Situation Alcohol / Substance Use: Not Applicable Psych Involvement: No (comment)  Admission diagnosis:  DKA (diabetic ketoacidosis) (HCC) [E11.10] Patient Active Problem List   Diagnosis Date Noted   Diabetic ketoacidosis without coma associated with type 2 diabetes mellitus (HCC) 11/03/2023   Acute pancreatitis 11/03/2023   Intractable nausea and vomiting 11/03/2023   S/P colostomy (HCC)    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)    Ulcer of left foot with fat layer exposed (HCC) 08/14/2023   Anemia of chronic disease 02/02/2023   Neuropathy, diabetic (HCC) 01/31/2023   Type 2 diabetes mellitus with hyperlipidemia (HCC) 01/31/2023   Obesity, Class I, BMI 30.0-34.9 (see actual  BMI) 01/31/2023   Abscess of foot 01/30/2023   Cellulitis in diabetic foot (HCC) 01/30/2023   Cellulitis 01/28/2023   Diabetic foot ulcer (HCC) 01/28/2023   Acute renal failure superimposed on stage 3b chronic kidney disease (HCC) 01/28/2023   HTN (hypertension) 01/28/2023   Hyperparathyroidism due to renal insufficiency 01/28/2023   H/O colon cancer, stage II 01/28/2023   PCP:  Salli Amato, MD Pharmacy:   [facility] [address] [place] - [address] [place] [address] Phone: [phone] Fax: [phone]  [facility] [address] [place] [address] Phone: [phone] Fax: [phone]     Social Drivers of Health (SDOH) Social History: SDOH Screenings   Food Insecurity: No Food Insecurity (11/03/2023)  Housing: Low Risk  (11/03/2023)  Transportation Needs: No Transportation Needs (11/03/2023)  Utilities: Not At Risk (11/03/2023)  Depression (PHQ2-9): Low Risk  (05/01/2023)  Financial Resource Strain: Medium Risk (08/25/2023)   Received from [facility]  Physical Activity: Insufficiently Active (05/25/2020)   [facility]  Social Connections: Socially Isolated (11/03/2023)  Stress: No Stress Concern Present (05/25/2020)   Received from [facility]  Tobacco Use: Medium Risk (11/02/2023)   SDOH Interventions:     Readmission Risk Interventions    11/03/2023    3:21 PM  Readmission Risk Prevention Plan  Transportation Screening Complete  PCP or Specialist Appt within 3-5 Days Complete  Social Work Consult for Recovery Care Planning/Counseling Complete  Palliative Care Screening Not Applicable  Medication Review Oceanographer) Complete

## 2023-11-03 NOTE — Assessment & Plan Note (Signed)
 Likely ATN from volume depletion related to fluid losses from vomiting Continue IV fluid resuscitation Expecting improvement to baseline with treatment

## 2023-11-04 ENCOUNTER — Encounter: Admission: EM | Disposition: E | Payer: Self-pay | Source: Home / Self Care | Attending: Student

## 2023-11-04 ENCOUNTER — Inpatient Hospital Stay: Admit: 2023-11-04 | Discharge: 2023-11-04 | Disposition: A | Attending: Student | Admitting: Student

## 2023-11-04 DIAGNOSIS — N179 Acute kidney failure, unspecified: Secondary | ICD-10-CM

## 2023-11-04 DIAGNOSIS — R079 Chest pain, unspecified: Secondary | ICD-10-CM | POA: Diagnosis not present

## 2023-11-04 DIAGNOSIS — I5021 Acute systolic (congestive) heart failure: Secondary | ICD-10-CM

## 2023-11-04 DIAGNOSIS — I428 Other cardiomyopathies: Secondary | ICD-10-CM | POA: Diagnosis not present

## 2023-11-04 DIAGNOSIS — I2102 ST elevation (STEMI) myocardial infarction involving left anterior descending coronary artery: Secondary | ICD-10-CM

## 2023-11-04 DIAGNOSIS — N189 Chronic kidney disease, unspecified: Secondary | ICD-10-CM

## 2023-11-04 DIAGNOSIS — I2510 Atherosclerotic heart disease of native coronary artery without angina pectoris: Secondary | ICD-10-CM

## 2023-11-04 DIAGNOSIS — E785 Hyperlipidemia, unspecified: Secondary | ICD-10-CM

## 2023-11-04 DIAGNOSIS — E1110 Type 2 diabetes mellitus with ketoacidosis without coma: Secondary | ICD-10-CM | POA: Diagnosis not present

## 2023-11-04 HISTORY — PX: CORONARY/GRAFT ACUTE MI REVASCULARIZATION: CATH118305

## 2023-11-04 HISTORY — PX: CORONARY ULTRASOUND/IVUS: CATH118244

## 2023-11-04 HISTORY — PX: LEFT HEART CATH AND CORONARY ANGIOGRAPHY: CATH118249

## 2023-11-04 LAB — ECHOCARDIOGRAM COMPLETE
AR max vel: 1.42 cm2
AV Area VTI: 1.42 cm2
AV Area mean vel: 1.29 cm2
AV Mean grad: 2 mmHg
AV Peak grad: 5 mmHg
Ao pk vel: 1.12 m/s
Area-P 1/2: 4.21 cm2
Calc EF: 27.3 %
Height: 66 in
S' Lateral: 4.9 cm
Single Plane A2C EF: 27.1 %
Single Plane A4C EF: 27 %
Weight: 3222.4 [oz_av]

## 2023-11-04 LAB — GLUCOSE, CAPILLARY
Glucose-Capillary: 315 mg/dL — ABNORMAL HIGH (ref 70–99)
Glucose-Capillary: 265 mg/dL — ABNORMAL HIGH (ref 70–99)
Glucose-Capillary: 203 mg/dL — ABNORMAL HIGH (ref 70–99)
Glucose-Capillary: 120 mg/dL — ABNORMAL HIGH (ref 70–99)

## 2023-11-04 LAB — BASIC METABOLIC PANEL WITH GFR
Anion gap: 12 (ref 5–15)
BUN: 57 mg/dL — ABNORMAL HIGH (ref 6–20)
CO2: 23 mmol/L (ref 22–32)
Calcium: 7.8 mg/dL — ABNORMAL LOW (ref 8.9–10.3)
Chloride: 103 mmol/L (ref 98–111)
Creatinine, Ser: 3.02 mg/dL — ABNORMAL HIGH (ref 0.61–1.24)
GFR, Estimated: 23 mL/min — ABNORMAL LOW (ref >60)
Glucose, Bld: 290 mg/dL — ABNORMAL HIGH (ref 70–99)
Potassium: 3.9 mmol/L (ref 3.5–5.1)
Sodium: 138 mmol/L (ref 135–145)

## 2023-11-04 LAB — CBC
HCT: 31.8 % — ABNORMAL LOW (ref 39.0–52.0)
HCT: 29 % — ABNORMAL LOW (ref 39.0–52.0)
Hemoglobin: 10.6 g/dL — ABNORMAL LOW (ref 13.0–17.0)
Hemoglobin: 9.7 g/dL — ABNORMAL LOW (ref 13.0–17.0)
MCH: 29.4 pg (ref 26.0–34.0)
MCH: 28.6 pg (ref 26.0–34.0)
MCHC: 33.3 g/dL (ref 30.0–36.0)
MCHC: 33.4 g/dL (ref 30.0–36.0)
MCV: 88.1 fL (ref 80.0–100.0)
MCV: 85.5 fL (ref 80.0–100.0)
Platelets: 191 K/uL (ref 150–400)
Platelets: 193 K/uL (ref 150–400)
RBC: 3.61 MIL/uL — ABNORMAL LOW (ref 4.22–5.81)
RBC: 3.39 MIL/uL — ABNORMAL LOW (ref 4.22–5.81)
RDW: 14.3 % (ref 11.5–15.5)
RDW: 14.4 % (ref 11.5–15.5)
WBC: 9.3 K/uL (ref 4.0–10.5)
WBC: 8.9 K/uL (ref 4.0–10.5)
nRBC: 0 % (ref 0.0–0.2)
nRBC: 0 % (ref 0.0–0.2)

## 2023-11-04 LAB — LIPID PANEL
Cholesterol: 135 mg/dL (ref 0–200)
HDL: 34 mg/dL — ABNORMAL LOW (ref >40)
LDL Cholesterol: 76 mg/dL (ref 0–99)
Total CHOL/HDL Ratio: 4 ratio
Triglycerides: 125 mg/dL (ref <150)
VLDL: 25 mg/dL (ref 0–40)

## 2023-11-04 LAB — MAGNESIUM: Magnesium: 2.2 mg/dL (ref 1.7–2.4)

## 2023-11-04 LAB — HEPATIC FUNCTION PANEL
ALT: 27 U/L (ref 0–44)
AST: 79 U/L — ABNORMAL HIGH (ref 15–41)
Albumin: 2 g/dL — ABNORMAL LOW (ref 3.5–5.0)
Alkaline Phosphatase: 70 U/L (ref 38–126)
Bilirubin, Direct: 0.4 mg/dL — ABNORMAL HIGH (ref 0.0–0.2)
Indirect Bilirubin: 0.9 mg/dL (ref 0.3–0.9)
Total Bilirubin: 1.3 mg/dL — ABNORMAL HIGH (ref 0.0–1.2)
Total Protein: 5.4 g/dL — ABNORMAL LOW (ref 6.5–8.1)

## 2023-11-04 LAB — CREATININE, SERUM
Creatinine, Ser: 3.04 mg/dL — ABNORMAL HIGH (ref 0.61–1.24)
GFR, Estimated: 23 mL/min — ABNORMAL LOW (ref >60)

## 2023-11-04 LAB — TROPONIN I (HIGH SENSITIVITY): Troponin I (High Sensitivity): >24000 ng/L (ref <18)

## 2023-11-04 LAB — PLATELET COUNT: Platelets: 192 K/uL (ref 150–400)

## 2023-11-04 LAB — POCT ACTIVATED CLOTTING TIME
Activated Clotting Time: 256 s
Activated Clotting Time: 320 s

## 2023-11-04 LAB — PROTIME-INR
INR: 1.6 — ABNORMAL HIGH (ref 0.8–1.2)
Prothrombin Time: 20.3 s — ABNORMAL HIGH (ref 11.4–15.2)

## 2023-11-04 LAB — APTT: aPTT: >200 s (ref 24–36)

## 2023-11-04 LAB — PHOSPHORUS: Phosphorus: 4.6 mg/dL (ref 2.5–4.6)

## 2023-11-04 LAB — LIPASE, BLOOD: Lipase: 41 U/L (ref 11–51)

## 2023-11-04 LAB — CG4 I-STAT (LACTIC ACID): Lactic Acid, Venous: 0.6 mmol/L (ref 0.5–1.9)

## 2023-11-04 SURGERY — CORONARY/GRAFT ACUTE MI REVASCULARIZATION
Anesthesia: Moderate Sedation

## 2023-11-04 MED ORDER — HEPARIN BOLUS VIA INFUSION
4000.0000 [IU] | Freq: Once | INTRAVENOUS | Status: AC
  Administered 2023-11-04: 4000 [IU] via INTRAVENOUS
  Filled 2023-11-04: qty 4000

## 2023-11-04 MED ORDER — ASPIRIN 325 MG PO TABS
ORAL_TABLET | ORAL | Status: AC
  Administered 2023-11-04: 325 mg via ORAL
  Filled 2023-11-04: qty 1

## 2023-11-04 MED ORDER — FUROSEMIDE 10 MG/ML IJ SOLN
60.0000 mg | Freq: Once | INTRAMUSCULAR | Status: AC
  Administered 2023-11-04: 60 mg via INTRAVENOUS
  Filled 2023-11-04: qty 6

## 2023-11-04 MED ORDER — PERFLUTREN LIPID MICROSPHERE
1.0000 mL | INTRAVENOUS | Status: AC | PRN
  Administered 2023-11-04: 2 mL via INTRAVENOUS

## 2023-11-04 MED ORDER — HEPARIN (PORCINE) IN NACL 1000-0.9 UT/500ML-% IV SOLN
INTRAVENOUS | Status: DC | PRN
  Administered 2023-11-04 (×2): 500 mL

## 2023-11-04 MED ORDER — SODIUM CHLORIDE 0.9% FLUSH: 3.0000 mL | INTRAVENOUS | Status: DC | PRN

## 2023-11-04 MED ORDER — LIDOCAINE HCL (PF) 1 % IJ SOLN
INTRAMUSCULAR | Status: DC | PRN
  Administered 2023-11-04 (×2): 2 mL

## 2023-11-04 MED ORDER — TIROFIBAN HCL IV 12.5 MG/250 ML
INTRAVENOUS | Status: AC | PRN
  Administered 2023-11-04: .075 ug/kg/min via INTRAVENOUS

## 2023-11-04 MED ORDER — SODIUM CHLORIDE 0.9% FLUSH
3.0000 mL | Freq: Two times a day (BID) | INTRAVENOUS | Status: DC
  Administered 2023-11-04 – 2023-11-11 (×16): 3 mL via INTRAVENOUS

## 2023-11-04 MED ORDER — ONDANSETRON HCL 4 MG/2ML IJ SOLN
4.0000 mg | Freq: Four times a day (QID) | INTRAMUSCULAR | Status: DC | PRN
  Administered 2023-11-08: 4 mg via INTRAVENOUS
  Filled 2023-11-04: qty 2

## 2023-11-04 MED ORDER — NITROGLYCERIN 0.4 MG SL SUBL
SUBLINGUAL_TABLET | SUBLINGUAL | Status: AC
  Filled 2023-11-04: qty 1

## 2023-11-04 MED ORDER — VITAMIN D (ERGOCALCIFEROL) 1.25 MG (50000 UNIT) PO CAPS
50000.0000 [IU] | ORAL_CAPSULE | ORAL | Status: DC
  Administered 2023-11-11: 50000 [IU] via ORAL
  Filled 2023-11-04 (×2): qty 1

## 2023-11-04 MED ORDER — NITROGLYCERIN 0.4 MG SL SUBL
0.4000 mg | SUBLINGUAL_TABLET | Freq: Once | SUBLINGUAL | Status: AC
Start: 2023-11-04 — End: 2023-11-04
  Administered 2023-11-04: 0.4 mg via SUBLINGUAL

## 2023-11-04 MED ORDER — ROSUVASTATIN CALCIUM 10 MG PO TABS
20.0000 mg | ORAL_TABLET | Freq: Every day | ORAL | Status: DC
Start: 2023-11-04 — End: 2023-11-12
  Administered 2023-11-04 – 2023-11-11 (×8): 20 mg via ORAL
  Filled 2023-11-04 (×8): qty 2

## 2023-11-04 MED ORDER — HEPARIN (PORCINE) 25000 UT/250ML-% IV SOLN
1300.0000 [IU]/h | INTRAVENOUS | Status: DC
  Administered 2023-11-04: 1300 [IU]/h via INTRAVENOUS
  Filled 2023-11-04: qty 250

## 2023-11-04 MED ORDER — ASPIRIN 325 MG PO TABS: 325.0000 mg | ORAL_TABLET | Freq: Once | ORAL | Status: AC

## 2023-11-04 MED ORDER — PRASUGREL HCL 10 MG PO TABS
ORAL_TABLET | ORAL | Status: DC | PRN
  Administered 2023-11-04: 60 mg via ORAL

## 2023-11-04 MED ORDER — ASPIRIN 81 MG PO CHEW
81.0000 mg | CHEWABLE_TABLET | Freq: Every day | ORAL | Status: DC
  Administered 2023-11-05 – 2023-11-11 (×7): 81 mg via ORAL
  Filled 2023-11-04 (×7): qty 1

## 2023-11-04 MED ORDER — INSULIN GLARGINE 100 UNIT/ML ~~LOC~~ SOLN
25.0000 [IU] | Freq: Every day | SUBCUTANEOUS | Status: DC
  Administered 2023-11-04 – 2023-11-11 (×8): 25 [IU] via SUBCUTANEOUS
  Filled 2023-11-04 (×9): qty 0.25

## 2023-11-04 MED ORDER — NITROGLYCERIN 1 MG/10 ML FOR IR/CATH LAB
INTRA_ARTERIAL | Status: DC | PRN
  Administered 2023-11-04: 100 ug

## 2023-11-04 MED ORDER — SODIUM CHLORIDE 0.9 % IV SOLN: INTRAVENOUS | Status: DC

## 2023-11-04 MED ORDER — ENOXAPARIN SODIUM 30 MG/0.3ML IJ SOSY
30.0000 mg | PREFILLED_SYRINGE | INTRAMUSCULAR | Status: DC
  Administered 2023-11-05 – 2023-11-06 (×2): 30 mg via SUBCUTANEOUS
  Filled 2023-11-04 (×2): qty 0.3

## 2023-11-04 MED ORDER — TIROFIBAN HCL IV 12.5 MG/250 ML: 0.0750 ug/kg/min | INTRAVENOUS | Status: AC

## 2023-11-04 MED ORDER — VERAPAMIL HCL 2.5 MG/ML IV SOLN
INTRAVENOUS | Status: DC | PRN
  Administered 2023-11-04: 2.5 mg via INTRAVENOUS

## 2023-11-04 MED ORDER — SODIUM CHLORIDE 0.9 % IV SOLN: 250.0000 mL | INTRAVENOUS | Status: AC | PRN

## 2023-11-04 MED ORDER — PRASUGREL HCL 10 MG PO TABS
10.0000 mg | ORAL_TABLET | Freq: Every day | ORAL | Status: DC
  Administered 2023-11-05 – 2023-11-08 (×4): 10 mg via ORAL
  Filled 2023-11-04 (×5): qty 1

## 2023-11-04 MED ORDER — MIDAZOLAM HCL 2 MG/2ML IJ SOLN
INTRAMUSCULAR | Status: DC | PRN
  Administered 2023-11-04: 1 mg via INTRAVENOUS

## 2023-11-04 MED ORDER — TIROFIBAN (AGGRASTAT) BOLUS VIA INFUSION
INTRAVENOUS | Status: DC | PRN
  Administered 2023-11-04: 2285 ug via INTRAVENOUS

## 2023-11-04 MED ORDER — HEPARIN SODIUM (PORCINE) 1000 UNIT/ML IJ SOLN
INTRAMUSCULAR | Status: DC | PRN
  Administered 2023-11-04: 6000 [IU] via INTRAVENOUS
  Administered 2023-11-04: 4000 [IU] via INTRAVENOUS

## 2023-11-04 MED ORDER — TAMSULOSIN HCL 0.4 MG PO CAPS
0.4000 mg | ORAL_CAPSULE | Freq: Every day | ORAL | Status: DC
  Administered 2023-11-04 – 2023-11-05 (×2): 0.4 mg via ORAL
  Filled 2023-11-04 (×2): qty 1

## 2023-11-04 SURGICAL SUPPLY — 18 items
BALLOON TREK RX 2.5X12 (BALLOONS) IMPLANT
BALLOON ~~LOC~~ TREK NEO RX 3.25X15 (BALLOONS) IMPLANT
CANISTER PENUMBRA ENGINE (MISCELLANEOUS) IMPLANT
CATH EAGLE EYE PLAT IMAGING (CATHETERS) IMPLANT
CATH INDIGO CAT RX KIT (CATHETERS) IMPLANT
CATH INFINITI JR4 5F (CATHETERS) IMPLANT
CATH LAUNCHER 6FR EBU3.5 (CATHETERS) IMPLANT
DEVICE RAD COMP TR BAND LRG (VASCULAR PRODUCTS) IMPLANT
DRAPE BRACHIAL (DRAPES) IMPLANT
GLIDESHEATH SLEND SS 6F .021 (SHEATH) IMPLANT
GUIDEWIRE INQWIRE 1.5J.035X260 (WIRE) IMPLANT
KIT ENCORE 26 ADVANTAGE (KITS) IMPLANT
PACK CARDIAC CATH (CUSTOM PROCEDURE TRAY) ×1 IMPLANT
SET ATX-X65L (MISCELLANEOUS) IMPLANT
STATION PROTECTION PRESSURIZED (MISCELLANEOUS) IMPLANT
STENT ONYX FRONTIER 3.0X34 (Permanent Stent) IMPLANT
TUBING CIL FLEX 10 FLL-RA (TUBING) IMPLANT
WIRE RUNTHROUGH .014X180CM (WIRE) IMPLANT

## 2023-11-04 NOTE — Consult Note (Signed)
 Cardiology Consultation   Patient ID: Jason Jennings MRN: [id number]; DOB: August 25, 1965  Admit date: 11/02/2023 Date of Consult: 11/04/2023  PCP:  Salli Amato, MD   [facility] Providers Cardiologist:  None        Patient Profile: Azel Gumina is a 58 y.o. 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 Von.  History of Present Illness: Mr. Bathe is a 58 year old male with no prior cardiac history.  He was hospitalized in July with sepsis due to MRSA cellulitis and abscess of the left foot which required I&D. He presented yesterday with 3 days of nausea, vomiting and left upper abdominal pain.  He was found to have pancreatitis with lipase of 300 and evidence of DKA and hyperglycemia.  In addition, he was noted to have acute on chronic kidney disease with creatinine of 3.29.  He was started on IV hydration and infusion drip. This morning, he was noted to have ST elevation on telemetry.  An echo and troponin were ordered.  His troponin was greater than 24,000 and echocardiogram showed severely reduced LV systolic function with anteroseptal, anterior and apical akinesis. I came to evaluate the patient and he reported intermittent chest tightness and heaviness over the last few days that worsened this morning with associated shortness of breath. Even though this was clearly a late presenting anterior STEMI, I elected to offer the patient proceeding with angiography and revascularization given persistent chest pain and ST elevation on EKG.  I did discuss the risks with him including worsening renal function requiring dialysis and he was agreeable to proceed.   Past Medical History:  Diagnosis Date   Adenocarcinoma of rectum (HCC)    Adenocarcinoma of  rectum (HCC)    Cancer (HCC)    Chronic kidney disease, stage 3b (HCC)    Diabetes mellitus due to underlying condition with diabetic autonomic neuropathy, unspecified whether long term insulin  use (HCC)    Diabetes mellitus without complication (HCC)    Diabetic polyneuropathy (HCC)    Diabetic retinopathy (HCC)    Neuropathy    S/P colostomy (HCC)    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)    Ulcer of left foot with fat layer exposed [facility])     Past Surgical History:  Procedure Laterality Date   CATARACT EXTRACTION W/PHACO Right 06/01/2023   Procedure: PHACOEMULSIFICATION, CATARACT, WITH IOL INSERTION 7.85, 00:57.4;  Surgeon: Enola Feliciano Hugger, MD;  Location: [facility];  Service: Ophthalmology;  Laterality: Right;   CATARACT EXTRACTION W/PHACO Left 06/15/2023   Procedure: PHACOEMULSIFICATION, CATARACT, WITH IOL INSERTION 7.15 00:40.8;  Surgeon: Enola Feliciano Hugger, MD;  Location: [facility];  Service: Ophthalmology;  Laterality: Left;   COLON SURGERY       Home Medications:  Prior to Admission medications   Medication Sig Start Date End Date Taking? Authorizing Provider  acetaminophen  (TYLENOL ) 325 MG tablet Take 2 tablets (650 mg total) by mouth every 6 (six) hours as needed for mild pain (pain score 1-3) or fever (or Fever >/= 101). 08/20/23   Alexander, Natalie, DO  amLODipine  (NORVASC ) 5 MG tablet Take 1 tablet (5 mg total) by mouth daily. 08/21/23   Alexander, Natalie, DO  Continuous Glucose Receiver (FREESTYLE LIBRE 2 READER) DEVI 1 Box by Does not apply route 2 (  two) times daily. 08/15/23   Dorinda Drue DASEN, MD  Continuous Glucose Receiver (FREESTYLE LIBRE 3 READER) DEVI 1 Box by Does not apply route 2 (two) times daily. 08/15/23   Dorinda Drue DASEN, MD  Continuous Glucose Sensor (FREESTYLE LIBRE 3 PLUS SENSOR) MISC Change sensor every 15 days. 08/15/23   Dorinda Drue DASEN, MD  Continuous Glucose Sensor (FREESTYLE LIBRE 3 PLUS SENSOR) MISC Change sensor  every 15 days. 08/15/23   Dorinda Drue DASEN, MD  fluticasone-salmeterol (ADVAIR) 250-50 MCG/ACT AEPB Inhale 1 puff into the lungs 2 (two) times daily. 07/26/23   [provider]  gabapentin  (NEURONTIN ) 100 MG capsule Take 100 mg by mouth 3 (three) times daily. 08/21/18   [provider]  gentamicin  ointment (GARAMYCIN ) 0.1 % Apply topically daily. To L foot ulcer until healed 08/21/23   Alexander, Natalie, DO  HUMALOG  MIX 75/25 KWIKPEN (75-25) 100 UNIT/ML KwikPen Inject 30 Units into the skin 2 (two) times daily with a meal. Qam and in the evening.      (He takes both Humalog  mix 75/25 BID and then Lantus  at bedtime) Pt reports taking to 25 units BID    [provider]  insulin  glargine (LANTUS  SOLOSTAR) 100 UNIT/ML Solostar Pen Inject 14 Units into the skin daily. Pt reports taking 12 units once daily    [provider]  losartan  (COZAAR ) 50 MG tablet Take 50 mg by mouth daily. Patient not taking: Reported on 09/14/2023    [provider]  methocarbamol  (ROBAXIN ) 750 MG tablet Take 750 mg by mouth 2 (two) times daily as needed. 12/28/22   [provider]  metoprolol  tartrate (LOPRESSOR ) 25 MG tablet Take 1 tablet (25 mg total) by mouth 2 (two) times daily. 08/20/23   Alexander, Natalie, DO  Oxycodone  HCl 10 MG TABS Take 10 mg by mouth every 6 (six) hours as needed. Patient not taking: Reported on 09/14/2023 12/23/22   [provider]  rosuvastatin  (CRESTOR ) 20 MG tablet Take 20 mg by mouth daily. 01/24/18   [provider]  tamsulosin  (FLOMAX ) 0.4 MG CAPS capsule Take 0.4 mg by mouth daily.    [provider]  testosterone cypionate (DEPOTESTOTERONE CYPIONATE) 100 MG/ML injection INJECT 1/2 (ONE-HALF) ML INTRAMUSCULARLY EVERY TWO WEEKS (DISCARD VIAL AFTER 30 DAYS) 03/29/23   [provider]  torsemide (DEMADEX) 10 MG tablet Take 10 mg by mouth every Monday, Wednesday, Friday, and Saturday at 6 PM. Patient not taking:  Reported on 09/14/2023    [provider]    Scheduled Meds:  [START ON 11/05/2023] aspirin  81 mg Oral Daily   Chlorhexidine  Gluconate Cloth  6 each Topical Daily   [START ON 11/05/2023] enoxaparin  (LOVENOX ) injection  30 mg Subcutaneous Q24H   feeding supplement (GLUCERNA SHAKE)  237 mL Oral TID BM   furosemide  60 mg Intravenous Once   insulin  aspart  0-15 Units Subcutaneous TID WC   insulin  glargine  25 Units Subcutaneous Daily   mupirocin  ointment  1 Application Nasal BID   pantoprazole  (PROTONIX ) IV  40 mg Intravenous Q24H   [START ON 11/05/2023] prasugrel  10 mg Oral Daily   rosuvastatin   20 mg Oral Daily   sodium chloride  flush  3 mL Intravenous Q12H   Vitamin D  (Ergocalciferol )  50,000 Units Oral Q7 days   Continuous Infusions:  sodium chloride      promethazine  (PHENERGAN ) injection (IM or IVPB) 12.5 mg (11/03/23 2039)   tirofiban     PRN Meds: sodium chloride , acetaminophen , morphine   injection, ondansetron  (ZOFRAN ) IV, mouth rinse, oxyCODONE , promethazine  (PHENERGAN ) injection (IM or IVPB), sodium chloride  flush  Allergies:   No Known Allergies  Social History:   Social History   Socioeconomic History   Marital status: Single    Spouse name: Not on file   Number of children: Not on file   Years of education: Not on file   Highest education level: Not on file  Occupational History   Not on file  Tobacco Use   Smoking status: Former    Current packs/day: 0.00    Types: Cigarettes    Quit date: 12/09/2014    Years since quitting: 8.9   Smokeless tobacco: Never  Vaping Use   Vaping status: Never Used  Substance and Sexual Activity   Alcohol use: Not Currently   Drug use: Not Currently    Types: Marijuana   Sexual activity: Not on file  Other Topics Concern   Not on file  Social History Narrative   Not on file   Social Drivers of Health   Financial Resource Strain: Medium Risk (08/25/2023)   Received from [facility]   Overall  Financial Resource Strain (CARDIA)    Difficulty of Paying Living Expenses: Somewhat hard  Food Insecurity: No Food Insecurity (11/03/2023)   Hunger Vital Sign    Worried About Running Out of Food in the Last Year: Never true    Ran Out of Food in the Last Year: Never true  Transportation Needs: No Transportation Needs (11/03/2023)   PRAPARE - Administrator, Civil Service (Medical): No    Lack of Transportation (Non-Medical): No  Physical Activity: Insufficiently Active (05/25/2020)   Received from [facility]   Exercise Vital Sign    On average, how many days per week do you engage in moderate to strenuous exercise (like a brisk walk)?: 3 days    On average, how many minutes do you engage in exercise at this level?: 10 min  Stress: No Stress Concern Present (05/25/2020)   Received from [facility] of Occupational Health - Occupational Stress Questionnaire    Feeling of Stress : Only a little  Social Connections: Socially Isolated (11/03/2023)   Social Connection and Isolation Panel    Frequency of Communication with Friends and Family: More than three times a week    Frequency of Social Gatherings with Friends and Family: More than three times a week    Attends Religious Services: Never    Database administrator or Organizations: No    Attends Banker Meetings: Never    Marital Status: Divorced  Catering manager Violence: Not At Risk (11/03/2023)   Humiliation, Afraid, Rape, and Kick questionnaire    Fear of Current or Ex-Partner: No    Emotionally Abused: No    Physically Abused: No    Sexually Abused: No    Family History:   History reviewed. No pertinent family history.   ROS:  Please see the history of present illness.   All other ROS reviewed and negative.     Physical Exam/Data: Vitals:   11/04/23 0800 11/04/23 0900 11/04/23 1000 11/04/23 1200  BP: (!) 120/90 130/81 (!) 154/108 (!) 162/82  Pulse: (!) 128 (!) 115 (!) 128  (!) 115  Resp: (!) 26 17 20 11   Temp: 98.3 F (36.8 C)   97.9 F (36.6 C)  TempSrc: Oral   Oral  SpO2: 95% 97% 97%   Weight:  Height:        Intake/Output Summary (Last 24 hours) at 11/04/2023 1255 Last data filed at 11/04/2023 1200 Gross per 24 hour  Intake 2846.38 ml  Output 625 ml  Net 2221.38 ml      11/02/2023   11:55 PM 08/12/2023    8:22 PM 08/12/2023    6:49 PM  Last 3 Weights  Weight (lbs) 201 lb 6.4 oz 188 lb 11.4 oz 177 lb 0.5 oz  Weight (kg) 91.354 kg 85.6 kg 80.3 kg     Body mass index is 32.51 kg/m.  General:  Well nourished, well developed, in no acute distress HEENT: normal Neck: no JVD Vascular: No carotid bruits; Distal pulses 2+ bilaterally Cardiac:  normal S1, S2; RRR; no murmur  Lungs:  clear to auscultation bilaterally, no wheezing, rhonchi or rales  Abd: soft, nontender, no hepatomegaly  Ext: no edema Musculoskeletal:  No deformities, BUE and BLE strength normal and equal Skin: warm and dry  Neuro:  CNs 2-12 intact, no focal abnormalities noted Psych:  Normal affect   EKG:  The EKG was personally reviewed and demonstrates: Sinus tachycardia with anterior ST elevation mostly in V2-V4 with Q waves. Telemetry:  Telemetry was personally reviewed and demonstrates:    Relevant CV Studies: Echocardiogram was personally reviewed by me and showed severely reduced LV systolic function with an EF of 20 to 25% with akinesis of anteroseptal, anterior and apical myocardium with mild mitral regurgitation.  Laboratory Data: High Sensitivity Troponin:   Recent Labs  Lab 11/04/23 0837  TROPONINIHS >24,000*     Chemistry Recent Labs  Lab 11/03/23 1021 11/03/23 1445 11/03/23 1833 11/04/23 0506  NA 140 140 138 138  K 3.2* 3.3* 3.3* 3.9  CL 100 102 102 103  CO2 26 26 24 23   GLUCOSE 152* 248* 236* 290*  BUN 53* 56* 58* 57*  CREATININE 3.02* 3.11* 3.08* 3.02*  CALCIUM  8.7* 8.4* 8.2* 7.8*  MG 2.1  --   --  2.2  GFRNONAA 23* 22* 22* 23*  ANIONGAP 14  12 12 12     Recent Labs  Lab 11/02/23 2315 11/04/23 0506  PROT 6.2* 5.4*  ALBUMIN 2.6* 2.0*  AST 122* 79*  ALT 40 27  ALKPHOS 78 70  BILITOT 1.1 1.3*   Lipids  Recent Labs  Lab 11/04/23 0837  CHOL 135  TRIG 125  HDL 34*  LDLCALC 76  CHOLHDL 4.0    Hematology Recent Labs  Lab 11/02/23 2115 11/04/23 0506  WBC 14.8* 9.3  RBC 4.14* 3.61*  HGB 11.8* 10.6*  HCT 36.4* 31.8*  MCV 87.9 88.1  MCH 28.5 29.4  MCHC 32.4 33.3  RDW 14.1 14.3  PLT 221 191   Thyroid No results for input(s): TSH, FREET4 in the last 168 hours.  BNPNo results for input(s): BNP, PROBNP in the last 168 hours.  DDimer No results for input(s): DDIMER in the last 168 hours.  Radiology/Studies:  CARDIAC CATHETERIZATION Result Date: 11/04/2023   Mid RCA lesion is 90% stenosed.   Mid Cx to Dist Cx lesion is 40% stenosed.   Mid LM to Dist LM lesion is 50% stenosed.   Mid LAD lesion is 100% stenosed.   Mid LAD to Dist LAD lesion is 30% stenosed.   A drug-eluting stent was successfully placed using a STENT ONYX FRONTIER 3.0X34.   Balloon angioplasty was performed using a BALLOON TREK RX 2.5X12.   Post intervention, there is a 0% residual stenosis. 1.  Late presenting anterior STEMI  due to thromotic occlusion of the mid LAD.  In addition, there is moderate distal left main stenosis and significant mid RCA stenosis. 2.  Left ventricular angiography was not performed.  EF was severely reduced by echo. 3.  Severely elevated left ventricular end-diastolic pressure at 33 mmHg with no evidence of cardiogenic shock by blood pressure and with normal lactic acid. 4.  Successful angioplasty, aspiration thrombectomy and drug-eluting stent placement to the mid LAD.  IVUS was performed after and showed no clear evidence of edge dissection. Recommendations: Dual antiplatelet therapy for at least 12 months. Will run Aggrastat infusion for 6 hours. Staged RCA PCI is recommended once medical issues and renal function improve. I  stopped all IV fluids and will give 1 dose of IV furosemide. The patient already has acute on chronic kidney disease and he is at risk of further worsening of this.   ECHOCARDIOGRAM COMPLETE Result Date: 11/04/2023    ECHOCARDIOGRAM REPORT   Patient Name:   EMILLIANO DILWORTH Date of Exam: 11/04/2023 Medical Rec #:  [id number]       Height:       66.0 in Accession #:    [id number]      Weight:       201.4 lb Date of Birth:  Jan 11, 1966       BSA:          2.006 m Patient Age:    58 years        BP:           120/96 mmHg Patient Gender: M               HR:           118 bpm. Exam Location:  [facility] Procedure: 2D Echo, Cardiac Doppler, Color Doppler and Intracardiac            Opacification Agent (Both Spectral and Color Flow Doppler were            utilized during procedure). Indications:     Chest Pain R07.9  History:         Patient has no prior history of Echocardiogram examinations.                  Risk Factors:Diabetes.  Sonographer:     Bari Roar Referring Phys:  [id number] ELVAN SOR Diagnosing Phys: Redell Cave MD IMPRESSIONS  1. Left ventricular ejection fraction, by estimation, is 20 to 25%. The left ventricle has severely decreased function. The left ventricle demonstrates global hypokinesis. The left ventricular internal cavity size was mildly dilated. There is mild left ventricular hypertrophy. Left ventricular diastolic parameters are indeterminate.  2. Right ventricular systolic function is normal. The right ventricular size is normal.  3. The mitral valve is degenerative. Mild mitral valve regurgitation.  4. Tricuspid valve regurgitation is mild to moderate.  5. The aortic valve was not well visualized. Aortic valve regurgitation is not visualized.  6. Aortic dilatation noted. There is mild dilatation of the ascending aorta, measuring 39 mm.  7. The inferior vena cava is normal in size with greater than 50% respiratory variability, suggesting right atrial pressure of 3 mmHg. FINDINGS  Left  Ventricle: Left ventricular ejection fraction, by estimation, is 20 to 25%. The left ventricle has severely decreased function. The left ventricle demonstrates global hypokinesis. Definity contrast agent was given IV to delineate the left ventricular endocardial borders. The left ventricular internal cavity size was mildly dilated. There is mild left ventricular hypertrophy. Left ventricular diastolic  parameters are indeterminate. Right Ventricle: The right ventricular size is normal. No increase in right ventricular wall thickness. Right ventricular systolic function is normal. Left Atrium: Left atrial size was normal in size. Right Atrium: Right atrial size was normal in size. Pericardium: There is no evidence of pericardial effusion. Mitral Valve: The mitral valve is degenerative in appearance. Mild mitral annular calcification. Mild mitral valve regurgitation. Tricuspid Valve: The tricuspid valve is grossly normal. Tricuspid valve regurgitation is mild to moderate. Aortic Valve: The aortic valve was not well visualized. Aortic valve regurgitation is not visualized. Aortic valve mean gradient measures 2.0 mmHg. Aortic valve peak gradient measures 5.0 mmHg. Aortic valve area, by VTI measures 1.42 cm. Pulmonic Valve: The pulmonic valve was not well visualized. Pulmonic valve regurgitation is not visualized. Aorta: Aortic dilatation noted. There is mild dilatation of the ascending aorta, measuring 39 mm. Venous: The inferior vena cava is normal in size with greater than 50% respiratory variability, suggesting right atrial pressure of 3 mmHg. IAS/Shunts: No atrial level shunt detected by color flow Doppler.  LEFT VENTRICLE PLAX 2D LVIDd:         5.60 cm      Diastology LVIDs:         4.90 cm      LV e' medial:    5.87 cm/s LV PW:         1.10 cm      LV E/e' medial:  18.9 LV IVS:        1.20 cm      LV e' lateral:   9.25 cm/s LVOT diam:     1.70 cm      LV E/e' lateral: 12.0 LV SV:         18 LV SV Index:   9 LVOT  Area:     2.27 cm  LV Volumes (MOD) LV vol d, MOD A2C: 131.0 ml LV vol d, MOD A4C: 141.0 ml LV vol s, MOD A2C: 95.5 ml LV vol s, MOD A4C: 103.0 ml LV SV MOD A2C:     35.5 ml LV SV MOD A4C:     141.0 ml LV SV MOD BP:      38.1 ml RIGHT VENTRICLE RV Basal diam:  3.20 cm RV Mid diam:    2.90 cm RV S prime:     8.81 cm/s TAPSE (M-mode): 1.7 cm LEFT ATRIUM             Index        RIGHT ATRIUM           Index LA diam:        3.80 cm 1.89 cm/m   RA Area:     18.00 cm LA Vol (A2C):   51.2 ml 25.53 ml/m  RA Volume:   51.40 ml  25.63 ml/m LA Vol (A4C):   56.2 ml 28.02 ml/m LA Biplane Vol: 56.7 ml 28.27 ml/m  AORTIC VALVE                    PULMONIC VALVE AV Area (Vmax):    1.42 cm     PV Vmax:          0.74 m/s AV Area (Vmean):   1.29 cm     PV Peak grad:     2.2 mmHg AV Area (VTI):     1.42 cm     PR End Diast Vel: 5.48 msec AV Vmax:  112.00 cm/s  RVOT Peak grad:   1 mmHg AV Vmean:          68.400 cm/s AV VTI:            0.130 m AV Peak Grad:      5.0 mmHg AV Mean Grad:      2.0 mmHg LVOT Vmax:         70.30 cm/s LVOT Vmean:        39.000 cm/s LVOT VTI:          0.081 m LVOT/AV VTI ratio: 0.62  AORTA Ao Root diam: 3.50 cm Ao Asc diam:  3.90 cm MITRAL VALVE                TRICUSPID VALVE MV Area (PHT): 4.21 cm     TR Peak grad:   37.9 mmHg MV Decel Time: 180 msec     TR Vmax:        308.00 cm/s MV E velocity: 111.00 cm/s                             SHUNTS                             Systemic VTI:  0.08 m                             Systemic Diam: 1.70 cm Redell Cave MD Electronically signed by Redell Cave MD Signature Date/Time: 11/04/2023/11:41:43 AM    Final    US  RENAL Result Date: 11/03/2023 CLINICAL DATA:  Acute kidney injury. Previous rectosigmoid resection with left lower quadrant colostomy. EXAM: RENAL / URINARY TRACT ULTRASOUND COMPLETE COMPARISON:  CT abdomen and pelvis 11/02/2023 FINDINGS: Right Kidney: Renal measurements: 9.7 x 5.6 x 5.3 cm = volume: 150 mL. Echogenicity within  normal limits. No mass or hydronephrosis visualized. Left Kidney: Renal measurements: 9.8 x 5.1 x 5.1 cm = volume: 132 mL. Echogenicity within normal limits. No mass or hydronephrosis visualized. Bladder: Appears normal for degree of bladder distention. Prevoid bladder volume is measured at 295 mL with large postvoid residual of 236 mL. Other: None. IMPRESSION: 1. Normal ultrasound appearance of the kidneys.  No hydronephrosis. 2. Large postvoid residual noted in the bladder. Electronically Signed   By: Elsie Gravely M.D.   On: 11/03/2023 17:48   CT ABDOMEN PELVIS WO CONTRAST Result Date: 11/02/2023 CLINICAL DATA:  Acute severe pancreatitis with vomiting since Monday. Abdominal pain around the osteotomy with increased output. History of diabetes with increased blood sugar. EXAM: CT ABDOMEN AND PELVIS WITHOUT CONTRAST TECHNIQUE: Multidetector CT imaging of the abdomen and pelvis was performed following the standard protocol without IV contrast. RADIATION DOSE REDUCTION: This exam was performed according to the departmental dose-optimization program which includes automated exposure control, adjustment of the mA and/or kV according to patient size and/or use of iterative reconstruction technique. COMPARISON:  CT chest abdomen and pelvis 03/27/2023 FINDINGS: Lower chest: Small bilateral pleural effusions with atelectasis in the lung bases. Hepatobiliary: No focal liver abnormality is seen. No gallstones, gallbladder wall thickening, or biliary dilatation. Pancreas: Unremarkable. No pancreatic ductal dilatation or surrounding inflammatory changes. Spleen: Focal lesion in the anterior superior spleen tip measuring 2.3 cm diameter. No change since prior study. This probably represents a hemangioma. Adrenals/Urinary Tract: No adrenal gland nodules. Kidneys are symmetrical. No renal, ureteral, or  bladder stones. No hydronephrosis or hydroureter. Bladder is normal. Stomach/Bowel: Stomach, small bowel, and colon are not  abnormally distended. Rectosigmoid resection with left lower quadrant descending colostomy. Presacral soft tissue thickening and stranding is similar to prior study, likely postoperative or post treatment changes. Peristomal hernia containing fat and small bowel. No proximal obstruction. Appendix is normal. Vascular/Lymphatic: Aortic atherosclerosis. No enlarged abdominal or pelvic lymph nodes. Reproductive: Prostate gland appears to be surgically absent. No pelvic mass identified. Other: No free air or free fluid in the abdomen. Musculoskeletal: Degenerative changes in the spine. Anterior compression of the T10 and 11 vertebral bodies. No change since prior study. IMPRESSION: 1. Small bilateral pleural effusions with basilar atelectasis. 2. Rectosigmoid resection with left lower quadrant colostomy. Peristomal hernia containing small bowel without proximal obstruction. 3. Presacral soft tissue prominence likely representing post treatment changes. No change since prior study. 4. Pancreas appears intact. No acute inflammatory changes identified. 5. Unchanged appearance of splenic lesion likely hemangioma. 6. Mild aortic atherosclerosis. Electronically Signed   By: Elsie Gravely M.D.   On: 11/02/2023 23:39     Assessment and Plan: Late presenting anterior STEMI complicated by acute systolic heart failure with severely reduced LV systolic function: Given that the patient was still having chest pain with persistent ST elevation, I felt that he likely still has viable myocardium to salvage and thus proceeded with emergent cardiac catheterization via the right radial artery which showed thrombotic occlusion of the mid LAD with minimal collaterals.  This was treated successfully with aspiration thrombectomy and drug-eluting stent placement.  There was moderate left main disease and significant stenosis in the mid RCA.  In addition, the whole distal LAD was diffusely diseased.  Will continue Aggrastat infusion for 6  hours without heparin .  Recommend dual antiplatelet therapy for at least 12 months.  Once his medical issues and renal function improved, we will consider proceeding with RCA PCI. 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  I stopped all IV fluids and will give 1 dose of IV furosemide.  There was no evidence of cardiogenic shock by hemodynamics and with normal lactic acid.  Will hold off on starting a beta-blocker today and likely consider adding small dose carvedilol tomorrow.  No ACE inhibitors or ARB's yet due to acute on chronic kidney disease. Hyperlipidemia: I resumed rosuvastatin . Acute on chronic kidney disease: This was present before PCI and there is risk of further worsening given contrast use.  130 mL of contrast was used for the procedure.  Cannot hydrate given significantly elevated LVEDP and instead we will give 1 dose of IV furosemide.    Risk Assessment/Risk Scores:    TIMI Risk Score for ST  Elevation MI:   The patient's TIMI risk score is 7, which indicates a 23.4% risk of all cause mortality at 30 days.   [place]  Heart Association (NYHA) Functional Class NYHA Class IV       For questions or updates, please contact [facility] Please consult [email or web address] for contact info under     Signed, Deatrice Cage, MD  11/04/2023 12:55 PM

## 2023-11-04 NOTE — Plan of Care (Signed)
  Problem: Education: Goal: Knowledge of General Education information will improve Description: Including pain rating scale, medication(s)/side effects and non-pharmacologic comfort measures Outcome: Progressing   Problem: Clinical Measurements: Goal: Will remain free from infection Outcome: Progressing   Problem: Health Behavior/Discharge Planning: Goal: Ability to manage health-related needs will improve Outcome: Not Progressing   Problem: Respiratory: Goal: Will regain and/or maintain adequate ventilation Outcome: Not Progressing   Problem: Activity: Goal: Risk for activity intolerance will decrease Outcome: Not Progressing   Problem: Pain Managment: Goal: General experience of comfort will improve and/or be controlled Outcome: Not Progressing

## 2023-11-04 NOTE — Progress Notes (Signed)
*  PRELIMINARY RESULTS* Echocardiogram 2D Echocardiogram has been performed.  Elaine Roanhorse C Heli Dino 11/04/2023, 10:06 AM

## 2023-11-04 NOTE — Plan of Care (Signed)
  Problem: Coping: Goal: Ability to adjust to condition or change in health will improve Outcome: Progressing   Problem: Fluid Volume: Goal: Ability to maintain a balanced intake and output will improve Outcome: Progressing   Problem: Metabolic: Goal: Ability to maintain appropriate glucose levels will improve Outcome: Progressing   Problem: Cardiac: Goal: Ability to maintain an adequate cardiac output will improve Outcome: Progressing   Problem: Fluid Volume: Goal: Ability to achieve a balanced intake and output will improve Outcome: Progressing   Problem: Nutritional: Goal: Maintenance of adequate nutrition will improve Outcome: Not Progressing

## 2023-11-04 NOTE — Progress Notes (Signed)
 9255- ST elevation was noticed on continuous ECG and 12 lead obtain. Dr. Von notified and trop and echo ordered.  9178- Lab called to collect trop since had not yet been collected.  9046- Urgent result trop >24,000, Dr. Von notified.  1014- STEMI called by RN and patient transported to cath lab when team arrived.

## 2023-11-04 NOTE — Inpatient Diabetes Management (Addendum)
 Inpatient Diabetes Program Recommendations  AACE/ADA: New Consensus Statement on Inpatient Glycemic Control   Target Ranges:  Prepandial:   less than 140 mg/dL      Peak postprandial:   less than 180 mg/dL (1-2 hours)      Critically ill patients:  140 - 180 mg/dL    Latest Reference Range & Units 11/03/23 10:40 11/03/23 11:41 11/03/23 12:52 11/03/23 16:20 11/03/23 21:05 11/04/23 07:44  Glucose-Capillary 70 - 99 mg/dL 857 (H) 844 (H) 828 (H) 239 (H) 192 (H) 315 (H)    Latest Reference Range & Units 11/03/23 03:58 11/03/23 05:25 11/03/23 06:06 11/03/23 07:34 11/03/23 08:36 11/03/23 09:37  Glucose-Capillary 70 - 99 mg/dL 695 (H) 787 (H) 830 (H) 169 (H) 173 (H) 158 (H)   Review of Glycemic Control  Diabetes history: DM2 Outpatient Diabetes medications: Lantus  14 units daily, Humalog  75/25 30 units BID, FreeStyle Libre 3 CGM Current orders for Inpatient glycemic control:  Lantus  20 units daily, Novolog  0-15 units TID with meals  Inpatient Diabetes Program Recommendations:    Insulin : CBG 315 mg/dl this morning. Please consider increasing Lantus  to 25 units daily, adding Novolog  0-5 units at bedtime, and when diet advanced - adding Novolog  3 units TID with meals if patient eats at least 50% of meals.  NOTE: Noted consult for Diabetes Coordinator. Diabetes Coordinator is not on campus over the weekend but available by pager from 8am to 5pm for questions or concerns.   Thanks, Earnie Gainer, RN, MSN, CDCES Diabetes Coordinator Inpatient Diabetes Program [phone] (Team Pager from 8am to 5pm)

## 2023-11-04 NOTE — Progress Notes (Signed)
 [facility] Progress Note  Patient: Jason Jennings    FMW:[id number]  DOA: 11/02/2023     Date of Service: the patient was seen and examined on 11/04/2023  Chief Complaint  Patient presents with   Emesis   Hyperglycemia   Brief hospital course: Jason Jennings is a 58 y.o. 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   He presented with 3 days of nausea, nonbloody, nonbilious, non-coffee-ground vomiting and left upper abdominal pain .  Denies fever or chills, cough or chest pain or change in bowel habits.  Emesis is nonbloody and nonbilious.  Has a history of pancreatitis about 12 years prior.  Denies alcohol use. In the ED tachycardic to 115 with otherwise normal vitals. Labs notable for lipase 300 with LFTs notable for AST 122 otherwise normal. Glucose 513 with anion gap of 23, bicarb 17, venous pH 7.36 and beta hydroxybutyric acid of 3.74. Creatinine 3.29, up from baseline of 2.27 WBC 14.8 and hemoglobin 11.8(Baseline 10-11) UA pending CT abdomen and pelvis for the most part nonacute-please see full report   Patient started on IV hydration, insulin  infusion and potassium replacement per Endo tool. Admission requested.    Assessment and Plan:  # STEMI, found to have EKG changes on 9/27 Troponin >24,000 EKG: ST elevation in V2/V3 and II, III and AVF It seems that patient was having STEMI on the telemetry since early morning and I think that he was having MI since arrival, atypical presentation with abdominal pain and nausea/vomiting.  Findings were masked by diabetic ketoacidosis and slightly elevated lipase. EKG and Trop were not done in the ED on admission. It was a late  presentation of the STEMI Cardio consulted, STEMI code was activated and patient was taken to cardiac Cath Lab S/p LAD stent As per cardiology continue Aggrastat infusion for 6 hours without heparin . Recommend dual antiplatelet therapy for at least 12 months. Once his medical issues and renal function improved, we will consider proceeding with RCA PCI.  Continue aspirin 81 mg p.o. daily, prasugrel 10 mg p.o. 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 TTE: LVEF 20 to 25%, global hypokinesis, mild to moderate TR DC'd IV fluid Lasix 60 mg x 1 dose given by cardio on 9/27 Monitor renal functions and volume status   # Diabetic ketoacidosis without coma associated with type 2 diabetes mellitus (HCC) S/p insulin  IV infusion and IVF as per DKA protocol Anion gap closed, transition to subcu insulin  9/26 started Lantus  20 units subcu daily NovoLog  sliding scale Monitor CBG, continue diabetic diet    # Acute pancreatitis # Intractable nausea and vomiting due to pancreatitis vs DKA vs gastroparesis Elevated lipase Mild elevation in lipase could be reactive Zofran  with Phenergan  for breakthrough S/p Reglan  10 mg q6 x 2 doses given  IV Protonix  S/p IV hydration     # Hypokalemia:  Replete cautiously due to renal failure # Hypophosphatemia: repleted orally Check electrolytes daily  # Acute renal failure superimposed on stage 3b chronic kidney disease (HCC) Likely ATN from volume depletion related to fluid losses from vomiting S/p IV fluid resuscitation Expecting improvement to baseline with treatment  # Intermittent urinary retention 9/27 bladder scan >400 ml Foley catheter was inserted, 675 ml urine was collected Continue  Foley catheter for 1 week Start Flomax  0.4 mg p.o. 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  Late presenting anterior STEMI due to thromotic occlusion of the mid LAD.  In addition, there is moderate distal left main stenosis and significant mid RCA stenosis. 2.  Left ventricular angiography was not performed.  EF was severely reduced by echo. 3.  Severely elevated left ventricular end-diastolic pressure at 33 mmHg with no evidence of cardiogenic shock by blood pressure and with normal lactic acid. 4.  Successful angioplasty, aspiration thrombectomy and drug-eluting stent placement to the mid LAD.  IVUS was performed after and showed no clear evidence of edge dissection.   Recommendations: Dual antiplatelet therapy for at least 12 months. Will run Aggrastat infusion for 6 hours. Staged RCA PCI is recommended once medical issues and renal function improve.  I stopped all IV fluids and will give 1 dose of IV furosemide. The patient already has acute on chronic kidney disease and he is at risk of further worsening of this.  Body mass index is 32.51 kg/m.  Interventions:  Diet: Heart healthy/carb modified, fluid restriction 1.5 to 2/day  DVT Prophylaxis: Subcutaneous Lovenox    Advance goals of care discussion: Full code  Family Communication: family was present at bedside, at the time of interview.  The pt provided permission to discuss medical plan with the family. Opportunity was given to ask question and all questions were answered satisfactorily.  9/27 discussed with patient's sister over the phone  Disposition:  Pt is from Home, admitted with DKA, and STEMI, still has uncontrolled blood glucose, which precludes a safe discharge. Discharge to home, when stable, most likely in 1 to 2 days.  Subjective: No significant events overnight, in the morning time patient was found to have EKG changes, mild chest pain denied any other  complaints.  Patient was taken to the Cath Lab and seen afterwards, denied any complaint, tolerated procedure well.   Physical Exam: General: NAD, lying comfortably Appear in no distress, affect appropriate Eyes: PERRLA ENT: Oral Mucosa Clear, moist  Neck: no JVD,  Cardiovascular: S1 and S2 Present, no Murmur,  Respiratory: good respiratory effort, Bilateral Air entry equal and Decreased, no Crackles, no wheezes Abdomen: Bowel Sound present, Soft and mild epigastric/mid abdomen tenderness,  Skin: no rashes Extremities: no Pedal edema, no calf tenderness Neurologic: without any new focal findings Gait not checked due to patient safety concerns  Vitals:   11/04/23 1300 11/04/23 1310 11/04/23 1400 11/04/23 1411  BP:  (!) 156/100 (!) 171/83   Pulse: (!) 115  (!) 110   Resp: 18  16   Temp:      TempSrc:      SpO2: 98%  98% 94%  Weight:      Height:        Intake/Output Summary (Last 24 hours) at 11/04/2023 1416 Last data filed at 11/04/2023 1400 Gross per 24 hour  Intake 2118.71 ml  Output 1300 ml  Net 818.71 ml   Filed Weights   11/02/23 2355  Weight: 91.4 kg    Data Reviewed: I have personally reviewed and interpreted daily labs, tele strips, imagings as discussed above. I reviewed all nursing  notes, pharmacy notes, vitals, pertinent old records I have discussed plan of care as described above with RN and patient/family.  CBC: Recent Labs  Lab 11/02/23 2115 11/04/23 0506 11/04/23 1316  WBC 14.8* 9.3 8.9  HGB 11.8* 10.6* 9.7*  HCT 36.4* 31.8* 29.0*  MCV 87.9 88.1 85.5  PLT 221 191 193   Basic Metabolic Panel: Recent Labs  Lab 11/03/23 0646 11/03/23 1021 11/03/23 1445 11/03/23 1833 11/04/23 0506 11/04/23 1316  NA 139 140 140 138 138  --   K 3.2* 3.2* 3.3* 3.3* 3.9  --   CL 102 100 102 102 103  --   CO2 25 26 26 24 23   --   GLUCOSE 156* 152* 248* 236* 290*  --   BUN 56* 53* 56* 58* 57*  --   CREATININE 2.91* 3.02* 3.11* 3.08* 3.02* 3.04*  CALCIUM  8.7*  8.7* 8.4* 8.2* 7.8*  --   MG  --  2.1  --   --  2.2  --   PHOS  --  2.3*  --   --  4.6  --     Studies: CARDIAC CATHETERIZATION Result Date: 11/04/2023   Mid RCA lesion is 90% stenosed.   Mid Cx to Dist Cx lesion is 40% stenosed.   Mid LM to Dist LM lesion is 50% stenosed.   Mid LAD lesion is 100% stenosed.   Mid LAD to Dist LAD lesion is 30% stenosed.   A drug-eluting stent was successfully placed using a STENT ONYX FRONTIER 3.0X34.   Balloon angioplasty was performed using a BALLOON TREK RX 2.5X12.   Post intervention, there is a 0% residual stenosis. 1.  Late presenting anterior STEMI due to thromotic occlusion of the mid LAD.  In addition, there is moderate distal left main stenosis and significant mid RCA stenosis. 2.  Left ventricular angiography was not performed.  EF was severely reduced by echo. 3.  Severely elevated left ventricular end-diastolic pressure at 33 mmHg with no evidence of cardiogenic shock by blood pressure and with normal lactic acid. 4.  Successful angioplasty, aspiration thrombectomy and drug-eluting stent placement to the mid LAD.  IVUS was performed after and showed no clear evidence of edge dissection. Recommendations: Dual antiplatelet therapy for at least 12 months. Will run Aggrastat infusion for 6 hours. Staged RCA PCI is recommended once medical issues and renal function improve. I stopped all IV fluids and will give 1 dose of IV furosemide. The patient already has acute on chronic kidney disease and he is at risk of further worsening of this.   ECHOCARDIOGRAM COMPLETE Result Date: 11/04/2023    ECHOCARDIOGRAM REPORT   Patient Name:   JADYN BARGE Date of Exam: 11/04/2023 Medical Rec #:  [id number]       Height:       66.0 in Accession #:    [id number]      Weight:       201.4 lb Date of Birth:  03-31-1965       BSA:          2.006 m Patient Age:    58 years        BP:           120/96 mmHg Patient Gender: M               HR:           118 bpm. Exam Location:  [facility]  Procedure: 2D Echo, Cardiac Doppler, Color Doppler and Intracardiac  Opacification Agent (Both Spectral and Color Flow Doppler were            utilized during procedure). Indications:     Chest Pain R07.9  History:         Patient has no prior history of Echocardiogram examinations.                  Risk Factors:Diabetes.  Sonographer:     Bari Roar Referring Phys:  [id number] ELVAN SOR Diagnosing Phys: Redell Cave MD IMPRESSIONS  1. Left ventricular ejection fraction, by estimation, is 20 to 25%. The left ventricle has severely decreased function. The left ventricle demonstrates global hypokinesis. The left ventricular internal cavity size was mildly dilated. There is mild left ventricular hypertrophy. Left ventricular diastolic parameters are indeterminate.  2. Right ventricular systolic function is normal. The right ventricular size is normal.  3. The mitral valve is degenerative. Mild mitral valve regurgitation.  4. Tricuspid valve regurgitation is mild to moderate.  5. The aortic valve was not well visualized. Aortic valve regurgitation is not visualized.  6. Aortic dilatation noted. There is mild dilatation of the ascending aorta, measuring 39 mm.  7. The inferior vena cava is normal in size with greater than 50% respiratory variability, suggesting right atrial pressure of 3 mmHg. FINDINGS  Left Ventricle: Left ventricular ejection fraction, by estimation, is 20 to 25%. The left ventricle has severely decreased function. The left ventricle demonstrates global hypokinesis. Definity contrast agent was given IV to delineate the left ventricular endocardial borders. The left ventricular internal cavity size was mildly dilated. There is mild left ventricular hypertrophy. Left ventricular diastolic parameters are indeterminate. Right Ventricle: The right ventricular size is normal. No increase in right ventricular wall thickness. Right ventricular systolic function is normal. Left Atrium: Left  atrial size was normal in size. Right Atrium: Right atrial size was normal in size. Pericardium: There is no evidence of pericardial effusion. Mitral Valve: The mitral valve is degenerative in appearance. Mild mitral annular calcification. Mild mitral valve regurgitation. Tricuspid Valve: The tricuspid valve is grossly normal. Tricuspid valve regurgitation is mild to moderate. Aortic Valve: The aortic valve was not well visualized. Aortic valve regurgitation is not visualized. Aortic valve mean gradient measures 2.0 mmHg. Aortic valve peak gradient measures 5.0 mmHg. Aortic valve area, by VTI measures 1.42 cm. Pulmonic Valve: The pulmonic valve was not well visualized. Pulmonic valve regurgitation is not visualized. Aorta: Aortic dilatation noted. There is mild dilatation of the ascending aorta, measuring 39 mm. Venous: The inferior vena cava is normal in size with greater than 50% respiratory variability, suggesting right atrial pressure of 3 mmHg. IAS/Shunts: No atrial level shunt detected by color flow Doppler.  LEFT VENTRICLE PLAX 2D LVIDd:         5.60 cm      Diastology LVIDs:         4.90 cm      LV e' medial:    5.87 cm/s LV PW:         1.10 cm      LV E/e' medial:  18.9 LV IVS:        1.20 cm      LV e' lateral:   9.25 cm/s LVOT diam:     1.70 cm      LV E/e' lateral: 12.0 LV SV:         18 LV SV Index:   9 LVOT Area:     2.27 cm  LV Volumes (MOD) LV  vol d, MOD A2C: 131.0 ml LV vol d, MOD A4C: 141.0 ml LV vol s, MOD A2C: 95.5 ml LV vol s, MOD A4C: 103.0 ml LV SV MOD A2C:     35.5 ml LV SV MOD A4C:     141.0 ml LV SV MOD BP:      38.1 ml RIGHT VENTRICLE RV Basal diam:  3.20 cm RV Mid diam:    2.90 cm RV S prime:     8.81 cm/s TAPSE (M-mode): 1.7 cm LEFT ATRIUM             Index        RIGHT ATRIUM           Index LA diam:        3.80 cm 1.89 cm/m   RA Area:     18.00 cm LA Vol (A2C):   51.2 ml 25.53 ml/m  RA Volume:   51.40 ml  25.63 ml/m LA Vol (A4C):   56.2 ml 28.02 ml/m LA Biplane Vol: 56.7 ml  28.27 ml/m  AORTIC VALVE                    PULMONIC VALVE AV Area (Vmax):    1.42 cm     PV Vmax:          0.74 m/s AV Area (Vmean):   1.29 cm     PV Peak grad:     2.2 mmHg AV Area (VTI):     1.42 cm     PR End Diast Vel: 5.48 msec AV Vmax:           112.00 cm/s  RVOT Peak grad:   1 mmHg AV Vmean:          68.400 cm/s AV VTI:            0.130 m AV Peak Grad:      5.0 mmHg AV Mean Grad:      2.0 mmHg LVOT Vmax:         70.30 cm/s LVOT Vmean:        39.000 cm/s LVOT VTI:          0.081 m LVOT/AV VTI ratio: 0.62  AORTA Ao Root diam: 3.50 cm Ao Asc diam:  3.90 cm MITRAL VALVE                TRICUSPID VALVE MV Area (PHT): 4.21 cm     TR Peak grad:   37.9 mmHg MV Decel Time: 180 msec     TR Vmax:        308.00 cm/s MV E velocity: 111.00 cm/s                             SHUNTS                             Systemic VTI:  0.08 m                             Systemic Diam: 1.70 cm Redell Cave MD Electronically signed by Redell Cave MD Signature Date/Time: 11/04/2023/11:41:43 AM    Final    US  RENAL Result Date: 11/03/2023 CLINICAL DATA:  Acute kidney injury. Previous rectosigmoid resection with left lower quadrant colostomy. EXAM: RENAL / URINARY TRACT ULTRASOUND COMPLETE COMPARISON:  CT abdomen and pelvis 11/02/2023 FINDINGS:  Right Kidney: Renal measurements: 9.7 x 5.6 x 5.3 cm = volume: 150 mL. Echogenicity within normal limits. No mass or hydronephrosis visualized. Left Kidney: Renal measurements: 9.8 x 5.1 x 5.1 cm = volume: 132 mL. Echogenicity within normal limits. No mass or hydronephrosis visualized. Bladder: Appears normal for degree of bladder distention. Prevoid bladder volume is measured at 295 mL with large postvoid residual of 236 mL. Other: None. IMPRESSION: 1. Normal ultrasound appearance of the kidneys.  No hydronephrosis. 2. Large postvoid residual noted in the bladder. Electronically Signed   By: Elsie Gravely M.D.   On: 11/03/2023 17:48    Scheduled Meds:  [START ON 11/05/2023]  aspirin  81 mg Oral Daily   Chlorhexidine  Gluconate Cloth  6 each Topical Daily   [START ON 11/05/2023] enoxaparin  (LOVENOX ) injection  30 mg Subcutaneous Q24H   feeding supplement (GLUCERNA SHAKE)  237 mL Oral TID BM   insulin  aspart  0-15 Units Subcutaneous TID WC   insulin  glargine  25 Units Subcutaneous Daily   mupirocin  ointment  1 Application Nasal BID   pantoprazole  (PROTONIX ) IV  40 mg Intravenous Q24H   [START ON 11/05/2023] prasugrel  10 mg Oral Daily   rosuvastatin   20 mg Oral Daily   sodium chloride  flush  3 mL Intravenous Q12H   Vitamin D  (Ergocalciferol )  50,000 Units Oral Q7 days   Continuous Infusions:  sodium chloride      promethazine  (PHENERGAN ) injection (IM or IVPB) 12.5 mg (11/03/23 2039)   tirofiban     PRN Meds: sodium chloride , acetaminophen , morphine  injection, ondansetron  (ZOFRAN ) IV, mouth rinse, oxyCODONE , promethazine  (PHENERGAN ) injection (IM or IVPB), sodium chloride  flush  Time spent: 55 minutes  Author: ELVAN SOR. MD [facility] 11/04/2023 2:16 PM  To reach On-call, see care teams to locate the attending and reach out to them via [email or web address]. If 7PM-7AM, please contact night-coverage If you still have difficulty reaching the attending provider, please page the [facility] (Director on Call) for [facility] on amion for assistance.

## 2023-11-05 ENCOUNTER — Inpatient Hospital Stay

## 2023-11-05 DIAGNOSIS — I5021 Acute systolic (congestive) heart failure: Secondary | ICD-10-CM

## 2023-11-05 DIAGNOSIS — E1110 Type 2 diabetes mellitus with ketoacidosis without coma: Secondary | ICD-10-CM | POA: Diagnosis not present

## 2023-11-05 DIAGNOSIS — I2102 ST elevation (STEMI) myocardial infarction involving left anterior descending coronary artery: Secondary | ICD-10-CM | POA: Diagnosis not present

## 2023-11-05 LAB — GLUCOSE, CAPILLARY
Glucose-Capillary: 102 mg/dL — ABNORMAL HIGH (ref 70–99)
Glucose-Capillary: 116 mg/dL — ABNORMAL HIGH (ref 70–99)
Glucose-Capillary: 113 mg/dL — ABNORMAL HIGH (ref 70–99)
Glucose-Capillary: 114 mg/dL — ABNORMAL HIGH (ref 70–99)
Glucose-Capillary: 154 mg/dL — ABNORMAL HIGH (ref 70–99)

## 2023-11-05 LAB — BASIC METABOLIC PANEL WITH GFR
Anion gap: 14 (ref 5–15)
BUN: 54 mg/dL — ABNORMAL HIGH (ref 6–20)
CO2: 23 mmol/L (ref 22–32)
Calcium: 7.9 mg/dL — ABNORMAL LOW (ref 8.9–10.3)
Chloride: 102 mmol/L (ref 98–111)
Creatinine, Ser: 2.84 mg/dL — ABNORMAL HIGH (ref 0.61–1.24)
GFR, Estimated: 25 mL/min — ABNORMAL LOW (ref >60)
Glucose, Bld: 103 mg/dL — ABNORMAL HIGH (ref 70–99)
Potassium: 3.1 mmol/L — ABNORMAL LOW (ref 3.5–5.1)
Sodium: 139 mmol/L (ref 135–145)

## 2023-11-05 LAB — HEPATIC FUNCTION PANEL
ALT: 26 U/L (ref 0–44)
AST: 60 U/L — ABNORMAL HIGH (ref 15–41)
Albumin: 2.1 g/dL — ABNORMAL LOW (ref 3.5–5.0)
Alkaline Phosphatase: 65 U/L (ref 38–126)
Bilirubin, Direct: 0.2 mg/dL (ref 0.0–0.2)
Indirect Bilirubin: 0.5 mg/dL (ref 0.3–0.9)
Total Bilirubin: 0.7 mg/dL (ref 0.0–1.2)
Total Protein: 5.6 g/dL — ABNORMAL LOW (ref 6.5–8.1)

## 2023-11-05 LAB — CBC
HCT: 30.8 % — ABNORMAL LOW (ref 39.0–52.0)
Hemoglobin: 10.2 g/dL — ABNORMAL LOW (ref 13.0–17.0)
MCH: 29.3 pg (ref 26.0–34.0)
MCHC: 33.1 g/dL (ref 30.0–36.0)
MCV: 88.5 fL (ref 80.0–100.0)
Platelets: 193 K/uL (ref 150–400)
RBC: 3.48 MIL/uL — ABNORMAL LOW (ref 4.22–5.81)
RDW: 14.6 % (ref 11.5–15.5)
WBC: 8.9 K/uL (ref 4.0–10.5)
nRBC: 0 % (ref 0.0–0.2)

## 2023-11-05 LAB — PHOSPHORUS: Phosphorus: 4.3 mg/dL (ref 2.5–4.6)

## 2023-11-05 LAB — MAGNESIUM: Magnesium: 2.2 mg/dL (ref 1.7–2.4)

## 2023-11-05 LAB — LIPASE, BLOOD: Lipase: 21 U/L (ref 11–51)

## 2023-11-05 MED ORDER — TRAMADOL HCL 50 MG PO TABS
25.0000 mg | ORAL_TABLET | Freq: Four times a day (QID) | ORAL | Status: DC | PRN
  Administered 2023-11-05 (×2): 25 mg via ORAL
  Filled 2023-11-05 (×2): qty 1

## 2023-11-05 MED ORDER — FUROSEMIDE 10 MG/ML IJ SOLN
40.0000 mg | Freq: Once | INTRAMUSCULAR | Status: AC
  Administered 2023-11-05: 40 mg via INTRAVENOUS
  Filled 2023-11-05: qty 4

## 2023-11-05 MED ORDER — ALUM & MAG HYDROXIDE-SIMETH 200-200-20 MG/5ML PO SUSP
15.0000 mL | Freq: Four times a day (QID) | ORAL | Status: DC | PRN
  Administered 2023-11-05: 15 mL via ORAL
  Filled 2023-11-05: qty 30

## 2023-11-05 MED ORDER — HYDROMORPHONE HCL 1 MG/ML IJ SOLN
0.5000 mg | INTRAMUSCULAR | Status: DC | PRN
Start: 2023-11-05 — End: 2023-11-12

## 2023-11-05 MED ORDER — PANTOPRAZOLE SODIUM 40 MG IV SOLR
40.0000 mg | Freq: Two times a day (BID) | INTRAVENOUS | Status: DC
  Administered 2023-11-05 – 2023-11-09 (×9): 40 mg via INTRAVENOUS
  Filled 2023-11-05 (×9): qty 10

## 2023-11-05 MED ORDER — METOPROLOL SUCCINATE ER 50 MG PO TB24
25.0000 mg | ORAL_TABLET | Freq: Every day | ORAL | Status: DC
  Administered 2023-11-05 – 2023-11-11 (×7): 25 mg via ORAL
  Filled 2023-11-05 (×7): qty 1

## 2023-11-05 MED ORDER — POTASSIUM CHLORIDE CRYS ER 20 MEQ PO TBCR
40.0000 meq | EXTENDED_RELEASE_TABLET | ORAL | Status: AC
  Administered 2023-11-05 (×2): 40 meq via ORAL
  Filled 2023-11-05 (×2): qty 2

## 2023-11-05 MED ORDER — FUROSEMIDE 20 MG PO TABS: 20.0000 mg | ORAL_TABLET | Freq: Every day | ORAL | Status: DC

## 2023-11-05 MED ORDER — FUROSEMIDE 10 MG/ML IJ SOLN: 40.0000 mg | Freq: Every day | INTRAMUSCULAR | Status: DC

## 2023-11-05 NOTE — Plan of Care (Signed)
 Problem: Education: Goal: Ability to describe self-care measures that may prevent or decrease complications (Diabetes Survival Skills Education) will improve Outcome: Progressing Goal: Individualized Educational Video(s) Outcome: Progressing   Problem: Coping: Goal: Ability to adjust to condition or change in health will improve Outcome: Progressing   Problem: Fluid Volume: Goal: Ability to maintain a balanced intake and output will improve Outcome: Progressing   Problem: Health Behavior/Discharge Planning: Goal: Ability to identify and utilize available resources and services will improve Outcome: Progressing Goal: Ability to manage health-related needs will improve Outcome: Progressing   Problem: Metabolic: Goal: Ability to maintain appropriate glucose levels will improve Outcome: Progressing   Problem: Nutritional: Goal: Maintenance of adequate nutrition will improve Outcome: Progressing Goal: Progress toward achieving an optimal weight will improve Outcome: Progressing   Problem: Skin Integrity: Goal: Risk for impaired skin integrity will decrease Outcome: Progressing   Problem: Tissue Perfusion: Goal: Adequacy of tissue perfusion will improve Outcome: Progressing   Problem: Education: Goal: Ability to describe self-care measures that may prevent or decrease complications (Diabetes Survival Skills Education) will improve Outcome: Progressing Goal: Individualized Educational Video(s) Outcome: Progressing   Problem: Cardiac: Goal: Ability to maintain an adequate cardiac output will improve Outcome: Progressing   Problem: Health Behavior/Discharge Planning: Goal: Ability to identify and utilize available resources and services will improve Outcome: Progressing Goal: Ability to manage health-related needs will improve Outcome: Progressing   Problem: Fluid Volume: Goal: Ability to achieve a balanced intake and output will improve Outcome: Progressing    Problem: Metabolic: Goal: Ability to maintain appropriate glucose levels will improve Outcome: Progressing   Problem: Nutritional: Goal: Maintenance of adequate nutrition will improve Outcome: Progressing Goal: Maintenance of adequate weight for body size and type will improve Outcome: Progressing   Problem: Respiratory: Goal: Will regain and/or maintain adequate ventilation Outcome: Progressing   Problem: Urinary Elimination: Goal: Ability to achieve and maintain adequate renal perfusion and functioning will improve Outcome: Progressing   Problem: Education: Goal: Knowledge of General Education information will improve Description: Including pain rating scale, medication(s)/side effects and non-pharmacologic comfort measures Outcome: Progressing   Problem: Health Behavior/Discharge Planning: Goal: Ability to manage health-related needs will improve Outcome: Progressing   Problem: Clinical Measurements: Goal: Ability to maintain clinical measurements within normal limits will improve Outcome: Progressing Goal: Will remain free from infection Outcome: Progressing Goal: Diagnostic test results will improve Outcome: Progressing Goal: Respiratory complications will improve Outcome: Progressing Goal: Cardiovascular complication will be avoided Outcome: Progressing   Problem: Activity: Goal: Risk for activity intolerance will decrease Outcome: Progressing   Problem: Nutrition: Goal: Adequate nutrition will be maintained Outcome: Progressing   Problem: Coping: Goal: Level of anxiety will decrease Outcome: Progressing   Problem: Elimination: Goal: Will not experience complications related to bowel motility Outcome: Progressing Goal: Will not experience complications related to urinary retention Outcome: Progressing   Problem: Pain Managment: Goal: General experience of comfort will improve and/or be controlled Outcome: Progressing   Problem: Safety: Goal:  Ability to remain free from injury will improve Outcome: Progressing   Problem: Skin Integrity: Goal: Risk for impaired skin integrity will decrease Outcome: Progressing   Problem: Education: Goal: Understanding of CV disease, CV risk reduction, and recovery process will improve Outcome: Progressing Goal: Individualized Educational Video(s) Outcome: Progressing   Problem: Activity: Goal: Ability to return to baseline activity level will improve Outcome: Progressing   Problem: Cardiovascular: Goal: Ability to achieve and maintain adequate cardiovascular perfusion will improve Outcome: Progressing Goal: Vascular access site(s) Level 0-1 will be maintained  Outcome: Progressing   Problem: Health Behavior/Discharge Planning: Goal: Ability to safely manage health-related needs after discharge will improve Outcome: Progressing

## 2023-11-05 NOTE — Progress Notes (Addendum)
 PHARMACY CONSULT NOTE - FOLLOW UP  Pharmacy Consult for Electrolyte Monitoring and Replacement   Recent Labs: Potassium (mmol/L)  Date Value  11/05/2023 3.1 (L)   Magnesium (mg/dL)  Date Value  90/71/7974 2.2   Calcium  (mg/dL)  Date Value  90/71/7974 7.9 (L)   Albumin (g/dL)  Date Value  90/71/7974 2.1 (L)   Phosphorus (mg/dL)  Date Value  90/71/7974 4.3   Sodium (mmol/L)  Date Value  11/05/2023 139     Assessment: 58 y.o. male with medical history significant for rectal cancer s/p Rectosigmoid resection with colostomy, insulin -dependent type 2 diabetes, CKD lllb, HTN, BPH, recent hospitalization (7/5-7/13/2025) with sepsis secondary to MRSA cellulitis/abscess left foot s/p I&D/antibiotics who is admitted with primary diagnosis of DKA and mild acute pancreatitis and subsequently a late presenting anterior STEMI. Pharmacy is asked to follow and replace electrolytes while in CCU  Goal of Therapy:  Potassium 4.0 - 5.1 mmol/L Magnesium 2.0 - 2.4 mg/dL All Other Electrolytes WNL   Plan:  ---40 mEq po KCl x 2 ---recheck electrolytes in am   Jason Jennings ,PharmD Clinical Pharmacist 11/05/2023 7:09 AM

## 2023-11-05 NOTE — Progress Notes (Signed)
 [facility] Progress Note  Patient: Jason Jennings    FMW:[id number]  DOA: 11/02/2023     Date of Service: the patient was seen and examined on 11/05/2023  Chief Complaint  Patient presents with   Emesis   Hyperglycemia   Brief hospital course: Jason Jennings is a 58 y.o. 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   He presented with 3 days of nausea, nonbloody, nonbilious, non-coffee-ground vomiting and left upper abdominal pain .  Denies fever or chills, cough or chest pain or change in bowel habits.  Emesis is nonbloody and nonbilious.  Has a history of pancreatitis about 12 years prior.  Denies alcohol use. In the ED tachycardic to 115 with otherwise normal vitals. Labs notable for lipase 300 with LFTs notable for AST 122 otherwise normal. Glucose 513 with anion gap of 23, bicarb 17, venous pH 7.36 and beta hydroxybutyric acid of 3.74. Creatinine 3.29, up from baseline of 2.27 WBC 14.8 and hemoglobin 11.8(Baseline 10-11) UA pending CT abdomen and pelvis for the most part nonacute-please see full report   Patient started on IV hydration, insulin  infusion and potassium replacement per Endo tool. Admission requested.    Assessment and Plan:  # STEMI, found to have EKG changes on 9/27 Troponin >24,000 EKG: ST elevation in V2/V3 and II, III and AVF It seems that patient was having STEMI on the telemetry since early morning and I think that he was having MI since arrival, atypical presentation with abdominal pain and nausea/vomiting.  Findings were masked by diabetic ketoacidosis and slightly elevated lipase. EKG and Trop were not done in the ED on admission. It was a late  presentation of the STEMI Cardio consulted, STEMI code was activated and patient was taken to cardiac Cath Lab S/p LAD stent As per cardiology continue Aggrastat infusion for 6 hours without heparin . Recommend dual antiplatelet therapy for at least 12 months. Once his medical issues and renal function improved, we will consider proceeding with RCA PCI.  Continue aspirin 81 mg p.o. daily, prasugrel 10 mg p.o. daily 9/28 left hand numbness, 1st 3 fingers,gradually improving.  MRI negative for any acute findings.   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 daily as per cards    # Diabetic ketoacidosis without coma associated with type 2 diabetes mellitus (HCC) S/p insulin  IV infusion and IVF as per DKA protocol Anion gap closed, transition to subcu insulin  9/26 started Lantus  20 units subcu daily NovoLog  sliding scale Monitor CBG, continue diabetic diet    # Acute pancreatitis # Intractable nausea and vomiting due to pancreatitis vs DKA vs gastroparesis Elevated lipase Mild elevation in lipase could be reactive Zofran  with Phenergan  for breakthrough S/p Reglan  10 mg q6 x 2 doses given  IV Protonix  S/p IV hydration     # Hypokalemia:  Replete cautiously due to renal failure # Hypophosphatemia: repleted orally Check electrolytes daily  # Acute renal failure superimposed on stage 3b chronic kidney disease (HCC) Likely ATN from volume depletion related to  fluid losses from vomiting S/p IV fluid resuscitation Expecting improvement to baseline with treatment  # Intermittent urinary retention 9/27 bladder scan >400 ml Foley catheter was inserted, 675 ml urine was collected Continue Foley catheter for 1 week Start Flomax  0.4 mg p.o. 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 weekly, follow with PCP to repeat vitamin D  level after 3 to 6 months.   Procedures  Cardiac cath on 9/27 1.  Late presenting anterior STEMI due to thromotic occlusion of the mid LAD.  In addition, there is moderate distal left main stenosis and significant mid RCA stenosis. 2.  Left ventricular angiography was not performed.  EF was severely reduced by echo. 3.  Severely elevated left ventricular end-diastolic pressure at 33 mmHg with no evidence of cardiogenic shock by blood pressure and with normal lactic acid. 4.  Successful angioplasty, aspiration thrombectomy and drug-eluting stent placement to the mid LAD.  IVUS was performed after and showed no clear evidence of edge dissection.   Recommendations: Dual antiplatelet therapy for at least 12 months. Will run Aggrastat infusion for 6 hours. Staged RCA PCI is recommended once medical issues and renal function improve.  I stopped all IV fluids and will give 1 dose of IV furosemide. The patient already has acute on chronic kidney disease and he is at risk of further worsening of this.  Body mass index is 32.51 kg/m.  Interventions:  Diet: Heart healthy/carb modified, fluid restriction 1.5L/day  DVT Prophylaxis: Subcutaneous Lovenox    Advance goals of care discussion: Full code  Family Communication: family was present at bedside, at the time of interview.  The pt provided permission to discuss medical plan with the family. Opportunity was given to ask question and all questions were answered satisfactorily.  9/27 discussed with  patient's sister over the phone 9/28 discussed with patient's sister at bedside  Disposition:  Pt is from Home, admitted with DKA, and STEMI, still has uncontrolled blood glucose, which precludes a safe discharge. Discharge to home, when stable, most likely in 1 to 2 days.  Subjective: No significant events overnight, in the morning time patient was complaining of some left handed numbness which is improving.  Denied any chest pain or palpitation, no significant shortness of breath.   Physical Exam: General: NAD, lying comfortably Appear in no distress, affect appropriate Eyes: PERRLA ENT: Oral Mucosa Clear, moist  Neck: no JVD,  Cardiovascular: S1 and S2 Present, no Murmur, Tachy/PVCs Respiratory: good respiratory effort, Bilateral Air entry equal and Decreased, no Crackles, no wheezes Abdomen: Bowel Sound present, Soft and mild epigastric/mid abdomen tenderness,  Skin: no rashes Extremities: 1-2+ Pedal edema, no calf tenderness Neurologic: without any new focal findings Gait not checked due to patient safety concerns  Vitals:   11/05/23 1100 11/05/23 1140 11/05/23 1141 11/05/23 1150  BP:  (!) 151/80 128/84   Pulse: (!) 114 (!) 107 (!) 107   Resp: 18  13   Temp:    98.7 F (37.1 C)  TempSrc:    Oral  SpO2: 100%  96%   Weight:      Height:  Intake/Output Summary (Last 24 hours) at 11/05/2023 1406 Last data filed at 11/05/2023 1239 Gross per 24 hour  Intake 803.15 ml  Output 1875 ml  Net -1071.85 ml   Filed Weights   11/02/23 2355  Weight: 91.4 kg    Data Reviewed: I have personally reviewed and interpreted daily labs, tele strips, imagings as discussed above. I reviewed all nursing notes, pharmacy notes, vitals, pertinent old records I have discussed plan of care as described above with RN and patient/family.  CBC: Recent Labs  Lab 11/02/23 2115 11/04/23 0506 11/04/23 1316 11/04/23 1726 11/05/23 0317  WBC 14.8* 9.3 8.9  --  8.9  HGB 11.8* 10.6* 9.7*   --  10.2*  HCT 36.4* 31.8* 29.0*  --  30.8*  MCV 87.9 88.1 85.5  --  88.5  PLT 221 191 193 192 193   Basic Metabolic Panel: Recent Labs  Lab 11/03/23 1021 11/03/23 1445 11/03/23 1833 11/04/23 0506 11/04/23 1316 11/05/23 0317  NA 140 140 138 138  --  139  K 3.2* 3.3* 3.3* 3.9  --  3.1*  CL 100 102 102 103  --  102  CO2 26 26 24 23   --  23  GLUCOSE 152* 248* 236* 290*  --  103*  BUN 53* 56* 58* 57*  --  54*  CREATININE 3.02* 3.11* 3.08* 3.02* 3.04* 2.84*  CALCIUM  8.7* 8.4* 8.2* 7.8*  --  7.9*  MG 2.1  --   --  2.2  --  2.2  PHOS 2.3*  --   --  4.6  --  4.3    Studies: MR BRAIN WO CONTRAST Result Date: 11/05/2023 EXAM: MRI BRAIN WITHOUT CONTRAST 11/05/2023 12:21:41 PM TECHNIQUE: Multiplanar multisequence MRI of the head/brain was performed without the administration of intravenous contrast. COMPARISON: CT head without contrast 03/27/2023. CLINICAL HISTORY: Neuro deficit, acute, stroke suspected. Eval for possible cva. FINDINGS: BRAIN AND VENTRICLES: No acute infarct. No intracranial hemorrhage. No mass. No midline shift. No hydrocephalus. The sella is unremarkable. Normal flow voids. ORBITS: Bilateral lens replacements are noted. The globes and orbits are otherwise within normal limits. SINUSES AND MASTOIDS: No acute abnormality. BONES AND SOFT TISSUES: Normal marrow signal. No acute soft tissue abnormality. IMPRESSION: 1. No acute intracranial abnormality related to the clinical history of neuro deficit and suspected stroke. Electronically signed by: Lonni Necessary MD 11/05/2023 12:35 PM EDT RP Workstation: [id number]    Scheduled Meds:  aspirin  81 mg Oral Daily   Chlorhexidine  Gluconate Cloth  6 each Topical Daily   enoxaparin  (LOVENOX ) injection  30 mg Subcutaneous Q24H   feeding supplement (GLUCERNA SHAKE)  237 mL Oral TID BM   [START ON 11/06/2023] furosemide  20 mg Oral Daily   insulin  aspart  0-15 Units Subcutaneous TID WC   insulin  glargine  25 Units Subcutaneous Daily    metoprolol  succinate  25 mg Oral Daily   mupirocin  ointment  1 Application Nasal BID   pantoprazole  (PROTONIX ) IV  40 mg Intravenous Q12H   prasugrel  10 mg Oral Daily   rosuvastatin   20 mg Oral Daily   sodium chloride  flush  3 mL Intravenous Q12H   tamsulosin   0.4 mg Oral QPC supper   Vitamin D  (Ergocalciferol )  50,000 Units Oral Q7 days   Continuous Infusions:  promethazine  (PHENERGAN ) injection (IM or IVPB) Stopped (11/04/23 1639)   PRN Meds: acetaminophen , alum & mag hydroxide-simeth, HYDROmorphone  (DILAUDID ) injection, ondansetron  (ZOFRAN ) IV, mouth rinse, promethazine  (PHENERGAN ) injection (IM or IVPB), sodium chloride  flush, traMADol   Time spent: 55 minutes  Author: ELVAN SOR. MD [facility] 11/05/2023 2:06 PM  To reach On-call, see care teams to locate the attending and reach out to them via [email or web address]. If 7PM-7AM, please contact night-coverage If you still have difficulty reaching the attending provider, please page the [facility] (Director on Call) for [facility] on amion for assistance.

## 2023-11-05 NOTE — Inpatient Diabetes Management (Signed)
 Inpatient Diabetes Program Recommendations  AACE/ADA: New Consensus Statement on Inpatient Glycemic Control   Target Ranges:  Prepandial:   less than 140 mg/dL      Peak postprandial:   less than 180 mg/dL (1-2 hours)      Critically ill patients:  140 - 180 mg/dL    Latest Reference Range & Units 11/04/23 07:44 11/04/23 12:24 11/04/23 16:06 11/04/23 22:00  Glucose-Capillary 70 - 99 mg/dL 684 (H) 734 (H) 796 (H) 120 (H)    Review of Glycemic Control  Diabetes history: DM2 Outpatient Diabetes medications: Lantus  14 units daily, Humalog  75/25 30 units BID, FreeStyle Libre 3 CGM Current orders for Inpatient glycemic control:  Lantus  25 units daily, Novolog  0-15 units TID with meals   Inpatient Diabetes Program Recommendations:     Insulin :  Please consider adding Novolog  0-5 units at bedtime and Novolog  3 units TID with meals if patient eats at least 50% of meals.   NOTE: Noted consult for Diabetes Coordinator. Diabetes Coordinator is not on campus over the weekend but available by pager from 8am to 5pm for questions or concerns.    Thanks, Earnie Gainer, RN, MSN, CDCES Diabetes Coordinator Inpatient Diabetes Program [phone] (Team Pager from 8am to 5pm)

## 2023-11-05 NOTE — Progress Notes (Addendum)
 40mg  of IV lasix given earlier this morning order by cardiology. This evening the patient was given an additional 40 mg since the patient had only voided 300 ml during the shift. Potassium was replaced. Today. MRI of the head performed today. About 30 minutes after receiving the second dose of 40mg  of lasix the patient reported feeling a little short of breath and discomfort in his chest. This RN called tele to see if there was any abnormality ST elevations or PVC shown as earlier but they don't report any changes at this time only that his oxygen would drop to 86 % at times. I will place the patient back on 2 liters of oxygen for comfort for now. Per telemetry the highest ST elevation today was 2.5. Cardiology and hospitalist have been notified. 2 liters of oxygen has been provided as the patient desats to 86% at rest.

## 2023-11-05 NOTE — Progress Notes (Addendum)
 Rounding Note   Patient Name: Jason Jennings Date of Encounter: 11/05/2023  [facility] Cardiologist: None   Subjective No acute events overnight, symptoms of chest pain overall improved/resolved.  She still feels a little congested.  Scheduled Meds:  aspirin  81 mg Oral Daily   Chlorhexidine  Gluconate Cloth  6 each Topical Daily   enoxaparin  (LOVENOX ) injection  30 mg Subcutaneous Q24H   feeding supplement (GLUCERNA SHAKE)  237 mL Oral TID BM   [START ON 11/06/2023] furosemide  20 mg Oral Daily   insulin  aspart  0-15 Units Subcutaneous TID WC   insulin  glargine  25 Units Subcutaneous Daily   metoprolol  succinate  25 mg Oral Daily   mupirocin  ointment  1 Application Nasal BID   pantoprazole  (PROTONIX ) IV  40 mg Intravenous Q12H   prasugrel  10 mg Oral Daily   rosuvastatin   20 mg Oral Daily   sodium chloride  flush  3 mL Intravenous Q12H   tamsulosin   0.4 mg Oral QPC supper   Vitamin D  (Ergocalciferol )  50,000 Units Oral Q7 days   Continuous Infusions:  promethazine  (PHENERGAN ) injection (IM or IVPB) Stopped (11/04/23 1639)   PRN Meds: acetaminophen , alum & mag hydroxide-simeth, HYDROmorphone  (DILAUDID ) injection, ondansetron  (ZOFRAN ) IV, mouth rinse, promethazine  (PHENERGAN ) injection (IM or IVPB), sodium chloride  flush, traMADol    Vital Signs  Vitals:   11/05/23 1141 11/05/23 1150 11/05/23 1300 11/05/23 1400  BP: 128/84   131/79  Pulse: (!) 107  99 (!) 103  Resp: 13  12 20   Temp:  98.7 F (37.1 C)    TempSrc:  Oral    SpO2: 96%  97% 92%  Weight:      Height:        Intake/Output Summary (Last 24 hours) at 11/05/2023 1510 Last data filed at 11/05/2023 1239 Gross per 24 hour  Intake 803.15 ml  Output 1875 ml  Net -1071.85 ml      11/02/2023   11:55 PM 08/12/2023    8:22 PM 08/12/2023    6:49 PM  Last 3 Weights  Weight (lbs) 201 lb 6.4 oz 188 lb 11.4 oz 177 lb 0.5 oz  Weight (kg) 91.354 kg 85.6 kg 80.3 kg      Telemetry Sinus tachycardia, heart rate  114- Personally Reviewed  ECG   - Personally Reviewed  Physical Exam  GEN: No acute distress.   Neck: No JVD Cardiac: RRR, no murmurs, rubs, or gallops.  Respiratory: Diminished breath sounds bilaterally GI: Soft, nontender, non-distended  MS: No edema; No deformity. Neuro:  Nonfocal  Psych: Normal affect   Labs High Sensitivity Troponin:   Recent Labs  Lab 11/04/23 0837  TROPONINIHS >24,000*     Chemistry Recent Labs  Lab 11/02/23 2315 11/03/23 0359 11/03/23 1021 11/03/23 1445 11/03/23 1833 11/04/23 0506 11/04/23 1316 11/05/23 0317  NA  --    < > 140   < > 138 138  --  139  K  --    < > 3.2*   < > 3.3* 3.9  --  3.1*  CL  --    < > 100   < > 102 103  --  102  CO2  --    < > 26   < > 24 23  --  23  GLUCOSE  --    < > 152*   < > 236* 290*  --  103*  BUN  --    < > 53*   < > 58* 57*  --  54*  CREATININE  --    < > 3.02*   < > 3.08* 3.02* 3.04* 2.84*  CALCIUM   --    < > 8.7*   < > 8.2* 7.8*  --  7.9*  MG  --   --  2.1  --   --  2.2  --  2.2  PROT 6.2*  --   --   --   --  5.4*  --  5.6*  ALBUMIN 2.6*  --   --   --   --  2.0*  --  2.1*  AST 122*  --   --   --   --  79*  --  60*  ALT 40  --   --   --   --  27  --  26  ALKPHOS 78  --   --   --   --  70  --  65  BILITOT 1.1  --   --   --   --  1.3*  --  0.7  GFRNONAA  --    < > 23*   < > 22* 23* 23* 25*  ANIONGAP  --    < > 14   < > 12 12  --  14   < > = values in this interval not displayed.    Lipids  Recent Labs  Lab 11/04/23 0837  CHOL 135  TRIG 125  HDL 34*  LDLCALC 76  CHOLHDL 4.0    Hematology Recent Labs  Lab 11/04/23 0506 11/04/23 1316 11/04/23 1726 11/05/23 0317  WBC 9.3 8.9  --  8.9  RBC 3.61* 3.39*  --  3.48*  HGB 10.6* 9.7*  --  10.2*  HCT 31.8* 29.0*  --  30.8*  MCV 88.1 85.5  --  88.5  MCH 29.4 28.6  --  29.3  MCHC 33.3 33.4  --  33.1  RDW 14.3 14.4  --  14.6  PLT 191 193 192 193   Thyroid No results for input(s): TSH, FREET4 in the last 168 hours.  BNPNo results for input(s):  BNP, PROBNP in the last 168 hours.  DDimer No results for input(s): DDIMER in the last 168 hours.   Radiology  DG Chest Port 1 View Result Date: 11/05/2023 CLINICAL DATA:  Shortness of breath. EXAM: PORTABLE CHEST 1 VIEW COMPARISON:  Lung bases from abdominopelvic CT 11/02/2023 FINDINGS: The heart is enlarged. Small bilateral pleural effusions. Hazy opacity in both lung bases. Opacity abutting the right major fissure may represent a small amount of pleural fluid or airspace disease. Vascular congestion. No pneumothorax. IMPRESSION: 1. Cardiomegaly with vascular congestion and small bilateral pleural effusions. 2. Hazy opacity in both lung bases, favor atelectasis in the setting of pleural effusions. Electronically Signed   By: Andrea Gasman M.D.   On: 11/05/2023 14:06   MR BRAIN WO CONTRAST Result Date: 11/05/2023 EXAM: MRI BRAIN WITHOUT CONTRAST 11/05/2023 12:21:41 PM TECHNIQUE: Multiplanar multisequence MRI of the head/brain was performed without the administration of intravenous contrast. COMPARISON: CT head without contrast 03/27/2023. CLINICAL HISTORY: Neuro deficit, acute, stroke suspected. Eval for possible cva. FINDINGS: BRAIN AND VENTRICLES: No acute infarct. No intracranial hemorrhage. No mass. No midline shift. No hydrocephalus. The sella is unremarkable. Normal flow voids. ORBITS: Bilateral lens replacements are noted. The globes and orbits are otherwise within normal limits. SINUSES AND MASTOIDS: No acute abnormality. BONES AND SOFT TISSUES: Normal marrow signal. No acute soft tissue abnormality. IMPRESSION: 1. No acute intracranial abnormality related to  the clinical history of neuro deficit and suspected stroke. Electronically signed by: Lonni Necessary MD 11/05/2023 12:35 PM EDT RP Workstation: [id number]   CARDIAC CATHETERIZATION Result Date: 11/04/2023   Mid RCA lesion is 90% stenosed.   Mid Cx to Dist Cx lesion is 40% stenosed.   Mid LM to Dist LM lesion is 50% stenosed.    Mid LAD lesion is 100% stenosed.   Mid LAD to Dist LAD lesion is 30% stenosed.   A drug-eluting stent was successfully placed using a STENT ONYX FRONTIER 3.0X34.   Balloon angioplasty was performed using a BALLOON TREK RX 2.5X12.   Post intervention, there is a 0% residual stenosis. 1.  Late presenting anterior STEMI due to thromotic occlusion of the mid LAD.  In addition, there is moderate distal left main stenosis and significant mid RCA stenosis. 2.  Left ventricular angiography was not performed.  EF was severely reduced by echo. 3.  Severely elevated left ventricular end-diastolic pressure at 33 mmHg with no evidence of cardiogenic shock by blood pressure and with normal lactic acid. 4.  Successful angioplasty, aspiration thrombectomy and drug-eluting stent placement to the mid LAD.  IVUS was performed after and showed no clear evidence of edge dissection. Recommendations: Dual antiplatelet therapy for at least 12 months. Will run Aggrastat infusion for 6 hours. Staged RCA PCI is recommended once medical issues and renal function improve. I stopped all IV fluids and will give 1 dose of IV furosemide. The patient already has acute on chronic kidney disease and he is at risk of further worsening of this.   ECHOCARDIOGRAM COMPLETE Result Date: 11/04/2023    ECHOCARDIOGRAM REPORT   Patient Name:   ILLYA GIENGER Date of Exam: 11/04/2023 Medical Rec #:  [id number]       Height:       66.0 in Accession #:    [id number]      Weight:       201.4 lb Date of Birth:  12-04-1965       BSA:          2.006 m Patient Age:    58 years        BP:           120/96 mmHg Patient Gender: M               HR:           118 bpm. Exam Location:  [facility] Procedure: 2D Echo, Cardiac Doppler, Color Doppler and Intracardiac            Opacification Agent (Both Spectral and Color Flow Doppler were            utilized during procedure). Indications:     Chest Pain R07.9  History:         Patient has no prior history of Echocardiogram  examinations.                  Risk Factors:Diabetes.  Sonographer:     Bari Roar Referring Phys:  [id number] ELVAN SOR Diagnosing Phys: Redell Cave MD IMPRESSIONS  1. Left ventricular ejection fraction, by estimation, is 20 to 25%. The left ventricle has severely decreased function. The left ventricle demonstrates global hypokinesis. The left ventricular internal cavity size was mildly dilated. There is mild left ventricular hypertrophy. Left ventricular diastolic parameters are indeterminate.  2. Right ventricular systolic function is normal. The right ventricular size is normal.  3. The mitral valve is degenerative. Mild mitral valve regurgitation.  4. Tricuspid valve regurgitation is mild to moderate.  5. The aortic valve was not well visualized. Aortic valve regurgitation is not visualized.  6. Aortic dilatation noted. There is mild dilatation of the ascending aorta, measuring 39 mm.  7. The inferior vena cava is normal in size with greater than 50% respiratory variability, suggesting right atrial pressure of 3 mmHg. FINDINGS  Left Ventricle: Left ventricular ejection fraction, by estimation, is 20 to 25%. The left ventricle has severely decreased function. The left ventricle demonstrates global hypokinesis. Definity contrast agent was given IV to delineate the left ventricular endocardial borders. The left ventricular internal cavity size was mildly dilated. There is mild left ventricular hypertrophy. Left ventricular diastolic parameters are indeterminate. Right Ventricle: The right ventricular size is normal. No increase in right ventricular wall thickness. Right ventricular systolic function is normal. Left Atrium: Left atrial size was normal in size. Right Atrium: Right atrial size was normal in size. Pericardium: There is no evidence of pericardial effusion. Mitral Valve: The mitral valve is degenerative in appearance. Mild mitral annular calcification. Mild mitral valve regurgitation. Tricuspid  Valve: The tricuspid valve is grossly normal. Tricuspid valve regurgitation is mild to moderate. Aortic Valve: The aortic valve was not well visualized. Aortic valve regurgitation is not visualized. Aortic valve mean gradient measures 2.0 mmHg. Aortic valve peak gradient measures 5.0 mmHg. Aortic valve area, by VTI measures 1.42 cm. Pulmonic Valve: The pulmonic valve was not well visualized. Pulmonic valve regurgitation is not visualized. Aorta: Aortic dilatation noted. There is mild dilatation of the ascending aorta, measuring 39 mm. Venous: The inferior vena cava is normal in size with greater than 50% respiratory variability, suggesting right atrial pressure of 3 mmHg. IAS/Shunts: No atrial level shunt detected by color flow Doppler.  LEFT VENTRICLE PLAX 2D LVIDd:         5.60 cm      Diastology LVIDs:         4.90 cm      LV e' medial:    5.87 cm/s LV PW:         1.10 cm      LV E/e' medial:  18.9 LV IVS:        1.20 cm      LV e' lateral:   9.25 cm/s LVOT diam:     1.70 cm      LV E/e' lateral: 12.0 LV SV:         18 LV SV Index:   9 LVOT Area:     2.27 cm  LV Volumes (MOD) LV vol d, MOD A2C: 131.0 ml LV vol d, MOD A4C: 141.0 ml LV vol s, MOD A2C: 95.5 ml LV vol s, MOD A4C: 103.0 ml LV SV MOD A2C:     35.5 ml LV SV MOD A4C:     141.0 ml LV SV MOD BP:      38.1 ml RIGHT VENTRICLE RV Basal diam:  3.20 cm RV Mid diam:    2.90 cm RV S prime:     8.81 cm/s TAPSE (M-mode): 1.7 cm LEFT ATRIUM             Index        RIGHT ATRIUM           Index LA diam:        3.80 cm 1.89 cm/m   RA Area:     18.00 cm LA Vol (A2C):   51.2 ml 25.53 ml/m  RA Volume:   51.40  ml  25.63 ml/m LA Vol (A4C):   56.2 ml 28.02 ml/m LA Biplane Vol: 56.7 ml 28.27 ml/m  AORTIC VALVE                    PULMONIC VALVE AV Area (Vmax):    1.42 cm     PV Vmax:          0.74 m/s AV Area (Vmean):   1.29 cm     PV Peak grad:     2.2 mmHg AV Area (VTI):     1.42 cm     PR End Diast Vel: 5.48 msec AV Vmax:           112.00 cm/s  RVOT Peak grad:    1 mmHg AV Vmean:          68.400 cm/s AV VTI:            0.130 m AV Peak Grad:      5.0 mmHg AV Mean Grad:      2.0 mmHg LVOT Vmax:         70.30 cm/s LVOT Vmean:        39.000 cm/s LVOT VTI:          0.081 m LVOT/AV VTI ratio: 0.62  AORTA Ao Root diam: 3.50 cm Ao Asc diam:  3.90 cm MITRAL VALVE                TRICUSPID VALVE MV Area (PHT): 4.21 cm     TR Peak grad:   37.9 mmHg MV Decel Time: 180 msec     TR Vmax:        308.00 cm/s MV E velocity: 111.00 cm/s                             SHUNTS                             Systemic VTI:  0.08 m                             Systemic Diam: 1.70 cm Redell Cave MD Electronically signed by Redell Cave MD Signature Date/Time: 11/04/2023/11:41:43 AM    Final    US  RENAL Result Date: 11/03/2023 CLINICAL DATA:  Acute kidney injury. Previous rectosigmoid resection with left lower quadrant colostomy. EXAM: RENAL / URINARY TRACT ULTRASOUND COMPLETE COMPARISON:  CT abdomen and pelvis 11/02/2023 FINDINGS: Right Kidney: Renal measurements: 9.7 x 5.6 x 5.3 cm = volume: 150 mL. Echogenicity within normal limits. No mass or hydronephrosis visualized. Left Kidney: Renal measurements: 9.8 x 5.1 x 5.1 cm = volume: 132 mL. Echogenicity within normal limits. No mass or hydronephrosis visualized. Bladder: Appears normal for degree of bladder distention. Prevoid bladder volume is measured at 295 mL with large postvoid residual of 236 mL. Other: None. IMPRESSION: 1. Normal ultrasound appearance of the kidneys.  No hydronephrosis. 2. Large postvoid residual noted in the bladder. Electronically Signed   By: Elsie Gravely M.D.   On: 11/03/2023 17:48    Cardiac Studies TTE 11/04/2023 1. Left ventricular ejection fraction, by estimation, is 20 to 25%. The  left ventricle has severely decreased function. The left ventricle  demonstrates global hypokinesis. The left ventricular internal cavity size  was mildly dilated. There is mild left  ventricular hypertrophy. Left  ventricular diastolic parameters are  indeterminate.   2. Right ventricular systolic function is normal. The right ventricular  size is normal.   3. The mitral valve is degenerative. Mild mitral valve regurgitation.   4. Tricuspid valve regurgitation is mild to moderate.   5. The aortic valve was not well visualized. Aortic valve regurgitation  is not visualized.   6. Aortic dilatation noted. There is mild dilatation of the ascending  aorta, measuring 39 mm.   7. The inferior vena cava is normal in size with greater than 50%  respiratory variability, suggesting right atrial pressure of 3 mmHg.   Patient Profile   58 y.o. male with history of diabetes, hypertension, CKD 3, rectal cancer s/p resection now with colostomy presenting with nausea, vomiting, diagnosed with DKA and pancreatitis.  Seen for chest pain, late presenting anterior STEMI.  Assessment & Plan   Anterior STEMI s/p DES to mid LAD 9/27. -mid RCA 90% stenosis. - Denies chest pain, - Continue aspirin, Effient, Crestor  20 mg daily. - consider staged PCI to RCA after acute illness.  2.  Cardiomyopathy EF 20 to 25% - Pulmonary vas congestion, pleural effusions -Feels congested/short of breath - Lasix 40 mg IV x 1 given, continue with Lasix 40 mg daily. - Monitor creatinine, ins and outs. - Start Toprol -XL 25 mg daily. - Titrate GDMT as BP permits.  3.  Abdominal discomfort, DKA, pancreatitis - Management as per primary team   Signed, Redell Cave, MD  11/05/2023, 3:10 PM

## 2023-11-06 ENCOUNTER — Encounter: Payer: Self-pay | Admitting: Cardiovascular Disease

## 2023-11-06 DIAGNOSIS — E1110 Type 2 diabetes mellitus with ketoacidosis without coma: Secondary | ICD-10-CM | POA: Diagnosis not present

## 2023-11-06 DIAGNOSIS — I5021 Acute systolic (congestive) heart failure: Secondary | ICD-10-CM | POA: Diagnosis not present

## 2023-11-06 DIAGNOSIS — N179 Acute kidney failure, unspecified: Secondary | ICD-10-CM | POA: Diagnosis not present

## 2023-11-06 DIAGNOSIS — I10 Essential (primary) hypertension: Secondary | ICD-10-CM

## 2023-11-06 DIAGNOSIS — I2109 ST elevation (STEMI) myocardial infarction involving other coronary artery of anterior wall: Secondary | ICD-10-CM | POA: Diagnosis not present

## 2023-11-06 DIAGNOSIS — I2102 ST elevation (STEMI) myocardial infarction involving left anterior descending coronary artery: Secondary | ICD-10-CM | POA: Diagnosis not present

## 2023-11-06 LAB — BASIC METABOLIC PANEL WITH GFR
Anion gap: 10 (ref 5–15)
Anion gap: 14 (ref 5–15)
BUN: 65 mg/dL — ABNORMAL HIGH (ref 6–20)
BUN: 75 mg/dL — ABNORMAL HIGH (ref 6–20)
CO2: 23 mmol/L (ref 22–32)
CO2: 22 mmol/L (ref 22–32)
Calcium: 7.8 mg/dL — ABNORMAL LOW (ref 8.9–10.3)
Calcium: 7.8 mg/dL — ABNORMAL LOW (ref 8.9–10.3)
Chloride: 102 mmol/L (ref 98–111)
Chloride: 96 mmol/L — ABNORMAL LOW (ref 98–111)
Creatinine, Ser: 4.1 mg/dL — ABNORMAL HIGH (ref 0.61–1.24)
Creatinine, Ser: 4.61 mg/dL — ABNORMAL HIGH (ref 0.61–1.24)
GFR, Estimated: 16 mL/min — ABNORMAL LOW (ref >60)
GFR, Estimated: 14 mL/min — ABNORMAL LOW (ref >60)
Glucose, Bld: 149 mg/dL — ABNORMAL HIGH (ref 70–99)
Glucose, Bld: 310 mg/dL — ABNORMAL HIGH (ref 70–99)
Potassium: 3.9 mmol/L (ref 3.5–5.1)
Potassium: 4.1 mmol/L (ref 3.5–5.1)
Sodium: 135 mmol/L (ref 135–145)
Sodium: 132 mmol/L — ABNORMAL LOW (ref 135–145)

## 2023-11-06 LAB — HEPATIC FUNCTION PANEL
ALT: 31 U/L (ref 0–44)
AST: 56 U/L — ABNORMAL HIGH (ref 15–41)
Albumin: 1.9 g/dL — ABNORMAL LOW (ref 3.5–5.0)
Alkaline Phosphatase: 82 U/L (ref 38–126)
Bilirubin, Direct: <0.1 mg/dL (ref 0.0–0.2)
Total Bilirubin: 0.5 mg/dL (ref 0.0–1.2)
Total Protein: 5.5 g/dL — ABNORMAL LOW (ref 6.5–8.1)

## 2023-11-06 LAB — CBC
HCT: 29.1 % — ABNORMAL LOW (ref 39.0–52.0)
Hemoglobin: 9.8 g/dL — ABNORMAL LOW (ref 13.0–17.0)
MCH: 28.7 pg (ref 26.0–34.0)
MCHC: 33.7 g/dL (ref 30.0–36.0)
MCV: 85.1 fL (ref 80.0–100.0)
Platelets: 180 K/uL (ref 150–400)
RBC: 3.42 MIL/uL — ABNORMAL LOW (ref 4.22–5.81)
RDW: 14.7 % (ref 11.5–15.5)
WBC: 7.8 K/uL (ref 4.0–10.5)
nRBC: 0 % (ref 0.0–0.2)

## 2023-11-06 LAB — MAGNESIUM: Magnesium: 2.3 mg/dL (ref 1.7–2.4)

## 2023-11-06 LAB — GLUCOSE, CAPILLARY
Glucose-Capillary: 145 mg/dL — ABNORMAL HIGH (ref 70–99)
Glucose-Capillary: 260 mg/dL — ABNORMAL HIGH (ref 70–99)
Glucose-Capillary: 258 mg/dL — ABNORMAL HIGH (ref 70–99)
Glucose-Capillary: 197 mg/dL — ABNORMAL HIGH (ref 70–99)

## 2023-11-06 LAB — PHOSPHORUS: Phosphorus: 5.1 mg/dL — ABNORMAL HIGH (ref 2.5–4.6)

## 2023-11-06 LAB — VITAMIN B12: Vitamin B-12: 914 pg/mL (ref 180–914)

## 2023-11-06 MED ORDER — TAMSULOSIN HCL 0.4 MG PO CAPS
0.4000 mg | ORAL_CAPSULE | Freq: Two times a day (BID) | ORAL | Status: DC
Start: 2023-11-06 — End: 2023-11-12
  Administered 2023-11-06 – 2023-11-11 (×11): 0.4 mg via ORAL
  Filled 2023-11-06 (×11): qty 1

## 2023-11-06 MED ORDER — IOHEXOL 300 MG/ML  SOLN
INTRAMUSCULAR | Status: DC | PRN
  Administered 2023-11-04: 130 mL

## 2023-11-06 MED ORDER — POTASSIUM CHLORIDE CRYS ER 20 MEQ PO TBCR
20.0000 meq | EXTENDED_RELEASE_TABLET | Freq: Once | ORAL | Status: AC
  Administered 2023-11-06: 20 meq via ORAL
  Filled 2023-11-06: qty 1

## 2023-11-06 MED ORDER — INSULIN ASPART 100 UNIT/ML IJ SOLN
0.0000 [IU] | Freq: Three times a day (TID) | INTRAMUSCULAR | Status: DC
  Administered 2023-11-07: 3 [IU] via SUBCUTANEOUS
  Administered 2023-11-07: 5 [IU] via SUBCUTANEOUS
  Administered 2023-11-07: 3 [IU] via SUBCUTANEOUS
  Administered 2023-11-08: 8 [IU] via SUBCUTANEOUS
  Administered 2023-11-08: 5 [IU] via SUBCUTANEOUS
  Administered 2023-11-09: 3 [IU] via SUBCUTANEOUS
  Administered 2023-11-09: 8 [IU] via SUBCUTANEOUS
  Administered 2023-11-09: 5 [IU] via SUBCUTANEOUS
  Administered 2023-11-10: 2 [IU] via SUBCUTANEOUS
  Administered 2023-11-10: 3 [IU] via SUBCUTANEOUS
  Administered 2023-11-11: 2 [IU] via SUBCUTANEOUS
  Administered 2023-11-11: 3 [IU] via SUBCUTANEOUS
  Filled 2023-11-06 (×12): qty 1

## 2023-11-06 MED ORDER — THIAMINE HCL 100 MG PO TABS
100.0000 mg | ORAL_TABLET | Freq: Every day | ORAL | Status: DC
  Administered 2023-11-07 – 2023-11-11 (×5): 100 mg via ORAL
  Filled 2023-11-06 (×8): qty 1

## 2023-11-06 MED ORDER — ADULT MULTIVITAMIN W/MINERALS CH
1.0000 | ORAL_TABLET | Freq: Every day | ORAL | Status: DC
  Administered 2023-11-07 – 2023-11-11 (×5): 1 via ORAL
  Filled 2023-11-06 (×5): qty 1

## 2023-11-06 MED ORDER — INSULIN ASPART 100 UNIT/ML IJ SOLN
0.0000 [IU] | Freq: Every day | INTRAMUSCULAR | Status: DC
  Administered 2023-11-08: 3 [IU] via SUBCUTANEOUS
  Administered 2023-11-10: 2 [IU] via SUBCUTANEOUS
  Filled 2023-11-06 (×2): qty 1

## 2023-11-06 MED ORDER — POTASSIUM CHLORIDE CRYS ER 20 MEQ PO TBCR: 40.0000 meq | EXTENDED_RELEASE_TABLET | Freq: Once | ORAL | Status: DC

## 2023-11-06 MED ORDER — FUROSEMIDE 10 MG/ML IJ SOLN
80.0000 mg | Freq: Once | INTRAMUSCULAR | Status: AC
  Administered 2023-11-06: 80 mg via INTRAVENOUS
  Filled 2023-11-06: qty 8

## 2023-11-06 MED ORDER — HEPARIN SODIUM (PORCINE) 5000 UNIT/ML IJ SOLN
5000.0000 [IU] | Freq: Three times a day (TID) | INTRAMUSCULAR | Status: DC
  Administered 2023-11-07 – 2023-11-11 (×14): 5000 [IU] via SUBCUTANEOUS
  Filled 2023-11-06 (×14): qty 1

## 2023-11-06 NOTE — Progress Notes (Signed)
 [facility] Progress Note  Patient: Jason Jennings    FMW:[id number]  DOA: 11/02/2023     Date of Service: the patient was seen and examined on 11/06/2023  Chief Complaint  Patient presents with   Emesis   Hyperglycemia   Brief hospital course: Jason Jennings is a 58 y.o. 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   He presented with 3 days of nausea, nonbloody, nonbilious, non-coffee-ground vomiting and left upper abdominal pain .  Denies fever or chills, cough or chest pain or change in bowel habits.  Emesis is nonbloody and nonbilious.  Has a history of pancreatitis about 12 years prior.  Denies alcohol use. In the ED tachycardic to 115 with otherwise normal vitals. Labs notable for lipase 300 with LFTs notable for AST 122 otherwise normal. Glucose 513 with anion gap of 23, bicarb 17, venous pH 7.36 and beta hydroxybutyric acid of 3.74. Creatinine 3.29, up from baseline of 2.27 WBC 14.8 and hemoglobin 11.8(Baseline 10-11) UA pending CT abdomen and pelvis for the most part nonacute-please see full report   Patient started on IV hydration, insulin  infusion and potassium replacement per Endo tool. Admission requested.    Assessment and Plan:  # STEMI, found to have EKG changes on 9/27 Troponin >24,000 EKG: ST elevation in V2/V3 and II, III and AVF It seems that patient was having STEMI on the telemetry since early morning and I think that he was having MI since arrival, atypical presentation with abdominal pain and nausea/vomiting.  Findings were masked by diabetic ketoacidosis and slightly elevated lipase. EKG and Trop were not done in the ED on admission. It was a late  presentation of the STEMI Cardio consulted, STEMI code was activated and patient was taken to cardiac Cath Lab S/p LAD stent As per cardiology continue Aggrastat infusion for 6 hours without heparin . Recommend dual antiplatelet therapy for at least 12 months. Once his medical issues and renal function improved, we will consider proceeding with RCA PCI.  Continue aspirin 81 mg p.o. daily, prasugrel 10 mg p.o. daily 9/28 left hand numbness, 1st 3 fingers,gradually improving.  MRI negative for any acute findings.   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 daily as per cards  # Acute renal failure superimposed on stage 3b chronic kidney disease (HCC) Likely ATN from volume depletion related to fluid losses from vomiting S/p IV fluid resuscitation Expecting improvement to baseline with treatment 9/29 creatinine 4.1, elevated, Discontinued IV Lasix today    # Diabetic ketoacidosis without coma associated with type 2 diabetes mellitus (HCC) S/p insulin  IV infusion and IVF as per DKA protocol Anion gap closed, transition to subcu insulin  9/26 started Lantus  20 units subcu daily NovoLog  sliding scale Monitor CBG, continue diabetic diet    # Acute pancreatitis # Intractable nausea and vomiting due to pancreatitis vs DKA vs gastroparesis Elevated lipase Mild elevation in lipase could be reactive Zofran  with Phenergan  for breakthrough S/p Reglan  10 mg q6 x 2 doses given  IV Protonix  S/p  IV hydration     # Hypokalemia:  Replete cautiously due to renal failure # Hypophosphatemia: repleted orally Check electrolytes daily  # Intermittent urinary retention 9/27 bladder scan >400 ml Foley catheter was inserted, 675 ml urine was collected Continue Foley  catheter for 1 week Start Flomax  0.4 mg p.o. 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 Monitor BP and titrate medications accordingly   # Anemia of chronic disease 04/17/23 iron level within normal range Folate and B12 within normal range Monitor H&H  # Vitamin D  insufficiency: started vitamin D  50,000 units p.o. weekly, follow with PCP to repeat vitamin D  level after 3 to 6 months.   Procedures  Cardiac cath on 9/27 1.  Late presenting anterior STEMI due to thromotic occlusion of the mid LAD.  In addition, there is moderate distal left main stenosis and significant mid RCA stenosis. 2.  Left ventricular angiography was not performed.  EF was severely reduced by echo. 3.  Severely elevated left ventricular end-diastolic pressure at 33 mmHg with no evidence of cardiogenic shock by blood pressure and with normal lactic acid. 4.  Successful angioplasty, aspiration thrombectomy and drug-eluting stent placement to the mid LAD.  IVUS was performed after and showed no clear evidence of edge dissection.   Recommendations: Dual antiplatelet therapy for at least 12 months. Will run Aggrastat infusion for 6 hours. Staged RCA PCI is recommended once medical issues and renal function improve.  I stopped all IV fluids and will give 1 dose of IV furosemide. The patient already has acute on chronic kidney disease and he is at risk of further worsening of this.  Body mass index is 32.51 kg/m.  Interventions:  Diet: Heart healthy/carb modified, fluid restriction 1.5L/day  DVT Prophylaxis: Subcutaneous Lovenox    Advance goals of care discussion: Full code  Family Communication: family was present at bedside, at the time of interview.  The pt provided permission to discuss medical plan with the family. Opportunity was given to ask  question and all questions were answered satisfactorily.  9/27 discussed with patient's sister over the phone 9/28 discussed with patient's sister at bedside  Disposition:  Pt is from Home, admitted with DKA, and STEMI, still has uncontrolled blood glucose, which precludes a safe discharge. Discharge to home, when stable, most likely in 1 to 2 days.  Subjective: No significant events overnight, patient was resting comfortably in the bed, denied any worsening of shortness of breath, no chest pain or palpitation, no any other active issues.    Physical Exam: General: NAD, lying comfortably Appear in no distress, affect appropriate Eyes: PERRLA ENT: Oral Mucosa Clear, moist  Neck: no JVD,  Cardiovascular: S1 and S2 Present, no Murmur, Tachy/PVCs Respiratory: good respiratory effort, Bilateral Air entry equal and Decreased, no Crackles, no wheezes Abdomen: Bowel Sound present, Soft and mild epigastric/mid abdomen tenderness,  Skin: no rashes Extremities: 1-2+ Pedal edema, no calf tenderness Neurologic: without any new focal findings Gait not checked due to patient safety concerns  Vitals:   11/06/23 0811 11/06/23 0935 11/06/23 1000 11/06/23 1200  BP: (!) 142/94  (!) 123/96 (!) 121/56  Pulse: 94 95 (!) 103 89  Resp: 16 17 15 15   Temp:  98.4 F (36.9 C)    TempSrc:  Axillary    SpO2: 99% 99% 100% 100%  Weight:  Height:        Intake/Output Summary (Last 24 hours) at 11/06/2023 1410 Last data filed at 11/06/2023 1300 Gross per 24 hour  Intake 1473 ml  Output 575 ml  Net 898 ml   Filed Weights   11/02/23 2355  Weight: 91.4 kg    Data Reviewed: I have personally reviewed and interpreted daily labs, tele strips, imagings as discussed above. I reviewed all nursing notes, pharmacy notes, vitals, pertinent old records I have discussed plan of care as described above with RN and patient/family.  CBC: Recent Labs  Lab 11/02/23 2115 11/04/23 0506 11/04/23 1316  11/04/23 1726 11/05/23 0317 11/06/23 0441  WBC 14.8* 9.3 8.9  --  8.9 7.8  HGB 11.8* 10.6* 9.7*  --  10.2* 9.8*  HCT 36.4* 31.8* 29.0*  --  30.8* 29.1*  MCV 87.9 88.1 85.5  --  88.5 85.1  PLT 221 191 193 192 193 180   Basic Metabolic Panel: Recent Labs  Lab 11/03/23 1021 11/03/23 1445 11/03/23 1833 11/04/23 0506 11/04/23 1316 11/05/23 0317 11/06/23 0441  NA 140 140 138 138  --  139 135  K 3.2* 3.3* 3.3* 3.9  --  3.1* 3.9  CL 100 102 102 103  --  102 102  CO2 26 26 24 23   --  23 23  GLUCOSE 152* 248* 236* 290*  --  103* 149*  BUN 53* 56* 58* 57*  --  54* 65*  CREATININE 3.02* 3.11* 3.08* 3.02* 3.04* 2.84* 4.10*  CALCIUM  8.7* 8.4* 8.2* 7.8*  --  7.9* 7.8*  MG 2.1  --   --  2.2  --  2.2 2.3  PHOS 2.3*  --   --  4.6  --  4.3 5.1*    Studies: No results found.   Scheduled Meds:  aspirin  81 mg Oral Daily   Chlorhexidine  Gluconate Cloth  6 each Topical Daily   feeding supplement (GLUCERNA SHAKE)  237 mL Oral TID BM   [START ON 11/07/2023] heparin  injection (subcutaneous)  5,000 Units Subcutaneous Q8H   insulin  aspart  0-15 Units Subcutaneous TID WC   insulin  glargine  25 Units Subcutaneous Daily   metoprolol  succinate  25 mg Oral Daily   mupirocin  ointment  1 Application Nasal BID   pantoprazole  (PROTONIX ) IV  40 mg Intravenous Q12H   prasugrel  10 mg Oral Daily   rosuvastatin   20 mg Oral Daily   sodium chloride  flush  3 mL Intravenous Q12H   tamsulosin   0.4 mg Oral QPC supper   Vitamin D  (Ergocalciferol )  50,000 Units Oral Q7 days   Continuous Infusions:  promethazine  (PHENERGAN ) injection (IM or IVPB) Stopped (11/04/23 1639)   PRN Meds: acetaminophen , alum & mag hydroxide-simeth, HYDROmorphone  (DILAUDID ) injection, ondansetron  (ZOFRAN ) IV, mouth rinse, promethazine  (PHENERGAN ) injection (IM or IVPB), sodium chloride  flush, traMADol   Time spent: 55 minutes  Author: ELVAN SOR. MD [facility] 11/06/2023 2:10 PM  To reach On-call, see care teams to locate  the attending and reach out to them via [email or web address]. If 7PM-7AM, please contact night-coverage If you still have difficulty reaching the attending provider, please page the [facility] (Director on Call) for [facility] on amion for assistance.

## 2023-11-06 NOTE — Care Management Important Message (Signed)
 Important Message  Patient Details  Name: Jason Jennings MRN: [id number] Date of Birth: 04/26/1965   Important Message Given:  Yes - Medicare IM     Rojelio SHAUNNA Rattler 11/06/2023, 4:01 PM

## 2023-11-06 NOTE — Progress Notes (Signed)
 Progress Note  Patient Name: Jason Jennings Date of Encounter: 11/06/2023  Primary Cardiologist: New - consult by Darron  Subjective   Status post emergent LHC on 11/04/2023 in the setting of late presenting anterior STEMI due to thrombotic occlusion of the mid LAD with residual moderate distal left main and significant mid RCA stenoses as outlined below.  Currently without symptoms of angina.  Does note some dyspnea.  Mild bruising along the right radial arteriotomy site.  No active bleeding.  Hemodynamically stable.  BUN/SCr 54/2.84 trending to 65/4.1 over the past 24 hours.  Hgb low though largely stable at 9.8.  He received IV Lasix 40 mg x 2 on 9/28 with progressive renal dysfunction leading to interruption of diuretic therapy.  Inpatient Medications    Scheduled Meds:  aspirin  81 mg Oral Daily   Chlorhexidine  Gluconate Cloth  6 each Topical Daily   enoxaparin  (LOVENOX ) injection  30 mg Subcutaneous Q24H   feeding supplement (GLUCERNA SHAKE)  237 mL Oral TID BM   insulin  aspart  0-15 Units Subcutaneous TID WC   insulin  glargine  25 Units Subcutaneous Daily   metoprolol  succinate  25 mg Oral Daily   mupirocin  ointment  1 Application Nasal BID   pantoprazole  (PROTONIX ) IV  40 mg Intravenous Q12H   prasugrel  10 mg Oral Daily   rosuvastatin   20 mg Oral Daily   sodium chloride  flush  3 mL Intravenous Q12H   tamsulosin   0.4 mg Oral QPC supper   Vitamin D  (Ergocalciferol )  50,000 Units Oral Q7 days   Continuous Infusions:  promethazine  (PHENERGAN ) injection (IM or IVPB) Stopped (11/04/23 1639)   PRN Meds: acetaminophen , alum & mag hydroxide-simeth, HYDROmorphone  (DILAUDID ) injection, ondansetron  (ZOFRAN ) IV, mouth rinse, promethazine  (PHENERGAN ) injection (IM or IVPB), sodium chloride  flush, traMADol    Vital Signs    Vitals:   11/06/23 0600 11/06/23 0800 11/06/23 0811 11/06/23 0935  BP: 138/75 (!) 114/25 (!) 142/94   Pulse: 78 98 94 95  Resp: 16 14 16 17   Temp:    98.4 F  (36.9 C)  TempSrc:    Axillary  SpO2: 100% 99% 99% 99%  Weight:      Height:        Intake/Output Summary (Last 24 hours) at 11/06/2023 1034 Last data filed at 11/06/2023 0900 Gross per 24 hour  Intake 1353 ml  Output 550 ml  Net 803 ml   Filed Weights   11/02/23 2355  Weight: 91.4 kg    Telemetry    Sinus rhythm with occasional PACs - Personally Reviewed  ECG    Sinus tachycardia, 101 bpm, inferior and anterior ST elevation consistent with prior tracings- Personally Reviewed  Physical Exam   GEN: No acute distress.   Neck: No JVD. Cardiac: RRR, no murmurs, rubs, or gallops.  Right radial arteriotomy site with Tegaderm in place.  No active bleeding.  Mild bruising along the medial aspect of the volar wrist.  No erythema, warmth, or soft tissue swelling.  Radial pulse 2+ proximal and distal to the arteriotomy site. Respiratory: Mildly diminished bilaterally.  GI: Soft, nontender, non-distended.   MS: Trivial bilateral pretibial edema; No deformity. Neuro:  Alert and oriented x 3; Nonfocal.  Psych: Normal affect.  Labs    Chemistry Recent Labs  Lab 11/04/23 0506 11/04/23 1316 11/05/23 0317 11/06/23 0441  NA 138  --  139 135  K 3.9  --  3.1* 3.9  CL 103  --  102 102  CO2 23  --  23 23  GLUCOSE 290*  --  103* 149*  BUN 57*  --  54* 65*  CREATININE 3.02* 3.04* 2.84* 4.10*  CALCIUM  7.8*  --  7.9* 7.8*  PROT 5.4*  --  5.6* 5.5*  ALBUMIN 2.0*  --  2.1* 1.9*  AST 79*  --  60* 56*  ALT 27  --  26 31  ALKPHOS 70  --  65 82  BILITOT 1.3*  --  0.7 0.5  GFRNONAA 23* 23* 25* 16*  ANIONGAP 12  --  14 10     Hematology Recent Labs  Lab 11/04/23 1316 11/04/23 1726 11/05/23 0317 11/06/23 0441  WBC 8.9  --  8.9 7.8  RBC 3.39*  --  3.48* 3.42*  HGB 9.7*  --  10.2* 9.8*  HCT 29.0*  --  30.8* 29.1*  MCV 85.5  --  88.5 85.1  MCH 28.6  --  29.3 28.7  MCHC 33.4  --  33.1 33.7  RDW 14.4  --  14.6 14.7  PLT 193 192 193 180    Cardiac EnzymesNo results for  input(s): TROPONINI in the last 168 hours. No results for input(s): TROPIPOC in the last 168 hours.   BNPNo results for input(s): BNP, PROBNP in the last 168 hours.   DDimer No results for input(s): DDIMER in the last 168 hours.   Radiology    DG Chest Port 1 View Result Date: 11/05/2023 IMPRESSION: 1. Cardiomegaly with vascular congestion and small bilateral pleural effusions. 2. Hazy opacity in both lung bases, favor atelectasis in the setting of pleural effusions. Electronically Signed   By: Andrea Gasman M.D.   On: 11/05/2023 14:06   MR BRAIN WO CONTRAST Result Date: 11/05/2023 IMPRESSION: 1. No acute intracranial abnormality related to the clinical history of neuro deficit and suspected stroke. Electronically signed by: Lonni Necessary MD 11/05/2023 12:35 PM EDT RP Workstation: [id number]    Cardiac Studies   2D echo 11/04/2023: 1. Left ventricular ejection fraction, by estimation, is 20 to 25%. The  left ventricle has severely decreased function. The left ventricle  demonstrates global hypokinesis. The left ventricular internal cavity size  was mildly dilated. There is mild left  ventricular hypertrophy. Left ventricular diastolic parameters are  indeterminate.   2. Right ventricular systolic function is normal. The right ventricular  size is normal.   3. The mitral valve is degenerative. Mild mitral valve regurgitation.   4. Tricuspid valve regurgitation is mild to moderate.   5. The aortic valve was not well visualized. Aortic valve regurgitation  is not visualized.   6. Aortic dilatation noted. There is mild dilatation of the ascending  aorta, measuring 39 mm.   7. The inferior vena cava is normal in size with greater than 50%  respiratory variability, suggesting right atrial pressure of 3 mmHg.  __________  LHC 11/04/2023:   Mid RCA lesion is 90% stenosed.   Mid Cx to Dist Cx lesion is 40% stenosed.   Mid LM to Dist LM lesion is 50% stenosed.   Mid LAD  lesion is 100% stenosed.   Mid LAD to Dist LAD lesion is 30% stenosed.   A drug-eluting stent was successfully placed using a STENT ONYX FRONTIER 3.0X34.   Balloon angioplasty was performed using a BALLOON TREK RX 2.5X12.   Post intervention, there is a 0% residual stenosis.   1.  Late presenting anterior STEMI due to thromotic occlusion of the mid LAD.  In addition, there is moderate distal left main stenosis  and significant mid RCA stenosis. 2.  Left ventricular angiography was not performed.  EF was severely reduced by echo. 3.  Severely elevated left ventricular end-diastolic pressure at 33 mmHg with no evidence of cardiogenic shock by blood pressure and with normal lactic acid. 4.  Successful angioplasty, aspiration thrombectomy and drug-eluting stent placement to the mid LAD.  IVUS was performed after and showed no clear evidence of edge dissection.   Recommendations: Dual antiplatelet therapy for at least 12 months. Will run Aggrastat infusion for 6 hours. Staged RCA PCI is recommended once medical issues and renal function improve.  I stopped all IV fluids and will give 1 dose of IV furosemide. The patient already has acute on chronic kidney disease and he is at risk of further worsening of this.  Patient Profile     58 y.o. male with history of IDDM, CKD stage III with anemia of chronic disease, HTN, and rectal cancer status post resection with colostomy admitted with late presenting anterior ST elevation MI complicated by acute HFrEF and AKI.  Assessment & Plan    1.  Late presenting anterior ST elevation MI: - Admitted on 11/04/2023, subsequently code STEMI was activated with emergent LHC demonstrating thrombotic occlusion of the mid LAD status post PCI/DES with residual distal left main and mid RCA stenoses - Continue DAPT with aspirin 81 mg and prasugrel 10 mg daily without interruption for minimum of 12 months dating back to date of PCI - Recommend staged PCI to the RCA once  medical issues, including renal function improve - Post-cath instructions - Cardiac rehab  2.  Acute HFrEF secondary to ICM: - He does appear mildly volume up, though diuresis is currently limited by AKI - At this time, it is unclear if this is related to contrast-induced nephropathy versus potential cardiorenal syndrome with acute ischemic cardiomyopathy - With IV diuresis he has had progressive renal dysfunction - If renal dysfunction continues to preclude IV diuresis, may need to augment with inotropic support - Continue Toprol -XL 25 mg, with acute ischemic cardiomyopathy will not escalate at this time - Progressive renal dysfunction precludes addition of ACE inhibitor, ARB, ARNI, or MRA - Escalate GDMT as able - Would benefit from advanced heart failure evaluation  3.  Acute on CKD stage IIIb with anemia of chronic disease: - Avoid nephrotoxic agents - No acute indication for transfusion - Continue to monitor  4.  HTN: - Blood pressure stable in the ICU in the 120s mmHg systolic - Currently on low-dose Toprol  as outlined above  5.  HLD: - LDL 76 this admission with target LDL less than 55 - Currently on rosuvastatin  20 mg with creatinine clearance 24.8, escalate rosuvastatin  moving forward as renal function improves - Obtain fasting lipid panel and LFT in 8 weeks with recommendation to further escalate lipid-lowering therapy as indicated to achieve target LDL - LP(a) pending  6.  Abnormal LFT: - Improving, likely in the setting of his ST elevation MI - Less likely congestive hepatopathy  7.  Hypokalemia: - Resolved      For questions or updates, please contact [facility] Please consult [email or web address] for contact info under Cardiology/STEMI.    Signed, Bernardino Bring, PA-C [facility] Pager: [phone] 11/06/2023, 10:34 AM

## 2023-11-06 NOTE — Progress Notes (Signed)
 Initial Nutrition Assessment  DOCUMENTATION CODES:   Obesity unspecified  INTERVENTION:   Glucerna Shake po TID, each supplement provides 220 kcal and 10 grams of protein  MVI po daily   Thiamine 100mg  po daily x 7 days   Pt at high refeed risk; recommend monitor potassium, magnesium and phosphorus labs daily until stable  Daily weights  NUTRITION DIAGNOSIS:   Inadequate oral intake related to acute illness as evidenced by per patient/family report.  GOAL:   Patient will meet greater than or equal to 90% of their needs  MONITOR:   PO intake, Supplement acceptance, Labs, Weight trends, I & O's, Skin  REASON FOR ASSESSMENT:   Malnutrition Screening Tool    ASSESSMENT:   58 y/o male with h/o HTN, CHF, CKD III, IDDM, HLD, vitamin D  deficiency, neuropathy, pancreatitis, marijuana use, kidney stones s/p ureteral stent, rectal adenocarcinoma s/p chemoradiation/low anterior resection (2016) with reccurence s/p chemoradiation/open abdominoperineal resection (2020), peristomal hernia (containing small bowel)  and recent hospitalization (7/5-7/13/2025) with sepsis secondary to MRSA cellulitis/abscess left foot s/p I&D and who is now admitted with DKA, 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  Met with pt in room today. Pt reports decreased appetite and oral intake for one week pta r/t nausea, vomiting and abdominal pain. Pt reports that today is the first day that he has been able to tolerate any food. Pt reports eating most of a bowl of oatmeal for breakfast this morning. Pt denies any nausea but does report some abdominal pain after eating. Pt reports that he is trying to eat small, frequent meals. Pt is drinking Glucerna (no chocolate). RD discussed with pt the importance of adequate nutrition needed to preserve lean muscle. Pt is agreeable to continue Glucerna. Pt is at high refeed risk. Per chart, pt appears weight stable at baseline.   Medications reviewed  and include: aspirin, heparin , insulin , protonix , vitamin D   Labs reviewed: K 3.9 wnl, BUN 65(H), creat 4.10(H), P 5.1(H), Mg 2.3 wnl Lipase- 21- 9/28 Vitamin D  24.57(L)- 9/26 Hgb 9.8(L), Hct 29.1(L) Cbgs- 260, 145 x 24 hrs  AIC 11.0(H)- 7/5  UOP-   NUTRITION - FOCUSED PHYSICAL EXAM:  Flowsheet Row Most Recent Value  Orbital Region No depletion  Upper Arm Region No depletion  Thoracic and Lumbar Region No depletion  Buccal Region No depletion  Temple Region No depletion  Clavicle Bone Region No depletion  Clavicle and Acromion Bone Region No depletion  Scapular Bone Region No depletion  Dorsal Hand No depletion  Patellar Region No depletion  Anterior Thigh Region No depletion  Posterior Calf Region No depletion  Edema (RD Assessment) Moderate  Hair Reviewed  Eyes Reviewed  Mouth Reviewed  Skin Reviewed  Nails Reviewed   Diet Order:   Diet Order             Diet heart healthy/carb modified Room service appropriate? Yes; Fluid consistency: Thin; Fluid restriction: 1500 mL Fluid  Diet effective now                  EDUCATION NEEDS:   Education needs have been addressed  Skin:  Skin Assessment: Reviewed RN Assessment  Last BM:  9/24  Height:   Ht Readings from Last 1 Encounters:  11/02/23 5' 6 (1.676 m)    Weight:   Wt Readings from Last 1 Encounters:  11/06/23 90.3 kg    Ideal Body Weight:  64.5 kg  BMI:  Body mass index is 32.13 kg/m.  Estimated Nutritional Needs:   Kcal:  1900-2200kcal/day  Protein:  95-110g/day  Fluid:  1.9-2.2L/day  Jason Shams MS, RD, LDN If unable to be reached, please send secure chat to RD inpatient available from 8:00a-4:00p daily

## 2023-11-06 NOTE — Progress Notes (Signed)
 Heart Failure Navigator Progress Note  Assessed for Heart & Vascular TOC clinic readiness.  Patient does not meet criteria due to Advanced Heart Failure team patient of Dr. Morene Brownie. Hospital follow-up will be scheduled prior to discharge.  Navigator will sign off at this time.    Charmaine Pines, RN, BSN [facility] Heart Failure Navigator Secure Chat Only

## 2023-11-06 NOTE — Plan of Care (Signed)
  Problem: Coping: Goal: Ability to adjust to condition or change in health will improve Outcome: Progressing   Problem: Fluid Volume: Goal: Ability to maintain a balanced intake and output will improve Outcome: Progressing   Problem: Health Behavior/Discharge Planning: Goal: Ability to identify and utilize available resources and services will improve Outcome: Progressing Goal: Ability to manage health-related needs will improve Outcome: Progressing   Problem: Metabolic: Goal: Ability to maintain appropriate glucose levels will improve Outcome: Progressing   Problem: Nutritional: Goal: Maintenance of adequate nutrition will improve Outcome: Progressing

## 2023-11-06 NOTE — Consult Note (Signed)
 Advanced Heart Failure Team Consult Note   Primary Physician: Salli Amato, MD Cardiologist:  None  Reason for Consultation: Acute on chronic systolic heart failure  HPI:    Jason Jennings is seen today for evaluation of acute on chronic systolic heart failure at the request of Dr. Darron.   Patient has multiple chronic medical problems including recent admission for sepsis requiring left foot debridement, chronic kidney disease, hypertension, prior history of colon cancer with low anterior resection and current ostomy.  He presented with late presenting anterior STEMI, underwent successful PCI, though poor flow down the vessel.  Has had progressive renal dysfunction since the catheterization.  He does report that he has not yet moved from bed and is having worsening orthopnea at night, having to sit almost straight up in bed due to shortness of breath.  Additionally, he has residual RCA disease.  Denies any active chest pain, but attributes his shortness of breath to this.  Would prefer to have his RCA fixed prior to discharge.      Objective:    Vital Signs:   Temp:  [98.3 F (36.8 C)-98.5 F (36.9 C)] 98.4 F (36.9 C) (09/29 0935) Pulse Rate:  [78-104] 87 (09/29 2000) Resp:  [11-23] 16 (09/29 2000) BP: (91-142)/(25-96) 136/95 (09/29 2000) SpO2:  [98 %-100 %] 100 % (09/29 2000) Weight:  [90.3 kg] 90.3 kg (09/29 1508) Last BM Date : 11/01/23  Weight change: Filed Weights   11/02/23 2355 11/06/23 1508  Weight: 91.4 kg 90.3 kg    Intake/Output:   Intake/Output Summary (Last 24 hours) at 11/06/2023 2157 Last data filed at 11/06/2023 1300 Gross per 24 hour  Intake 780 ml  Output 335 ml  Net 445 ml      Physical Exam    GENERAL: NAD, chronically ill-appearing appearing PULM:  Normal work of breathing, CTAB CARDIAC:  JVP: Elevated to the midneck         Normal rate with regular rhythm. No murmurs, rubs or gallops.  Trace edema. Warm and well perfused  extremities. ABDOMEN: Ostomy in place NEUROLOGIC: Patient is oriented x3 with no focal or lateralizing neurologic deficits.   Labs   Basic Metabolic Panel: Recent Labs  Lab 11/03/23 1021 11/03/23 1445 11/03/23 1833 11/04/23 0506 11/04/23 1316 11/05/23 0317 11/06/23 0441 11/06/23 1442  NA 140   < > 138 138  --  139 135 132*  K 3.2*   < > 3.3* 3.9  --  3.1* 3.9 4.1  CL 100   < > 102 103  --  102 102 96*  CO2 26   < > 24 23  --  23 23 22   GLUCOSE 152*   < > 236* 290*  --  103* 149* 310*  BUN 53*   < > 58* 57*  --  54* 65* 75*  CREATININE 3.02*   < > 3.08* 3.02* 3.04* 2.84* 4.10* 4.61*  CALCIUM  8.7*   < > 8.2* 7.8*  --  7.9* 7.8* 7.8*  MG 2.1  --   --  2.2  --  2.2 2.3  --   PHOS 2.3*  --   --  4.6  --  4.3 5.1*  --    < > = values in this interval not displayed.    Liver Function Tests: Recent Labs  Lab 11/02/23 2315 11/04/23 0506 11/05/23 0317 11/06/23 0441  AST 122* 79* 60* 56*  ALT 40 27 26 31   ALKPHOS 78 70 65 82  BILITOT  1.1 1.3* 0.7 0.5  PROT 6.2* 5.4* 5.6* 5.5*  ALBUMIN 2.6* 2.0* 2.1* 1.9*   Recent Labs  Lab 11/02/23 2115 11/04/23 0506 11/05/23 0317  LIPASE 300* 41 21   No results for input(s): AMMONIA in the last 168 hours.  CBC: Recent Labs  Lab 11/02/23 2115 11/04/23 0506 11/04/23 1316 11/04/23 1726 11/05/23 0317 11/06/23 0441  WBC 14.8* 9.3 8.9  --  8.9 7.8  HGB 11.8* 10.6* 9.7*  --  10.2* 9.8*  HCT 36.4* 31.8* 29.0*  --  30.8* 29.1*  MCV 87.9 88.1 85.5  --  88.5 85.1  PLT 221 191 193 192 193 180    Cardiac Enzymes: No results for input(s): CKTOTAL, CKMB, CKMBINDEX, TROPONINI in the last 168 hours.  BNP: BNP (last 3 results) No results for input(s): BNP in the last 8760 hours.  ProBNP (last 3 results) No results for input(s): PROBNP in the last 8760 hours.   CBG: Recent Labs  Lab 11/05/23 2258 11/06/23 0820 11/06/23 1108 11/06/23 1707 11/06/23 2132  GLUCAP 154* 145* 260* 258* 197*    Coagulation  Studies: Recent Labs    11/04/23 1316  LABPROT 20.3*  INR 1.6*     Medications:     Current Medications:  aspirin  81 mg Oral Daily   Chlorhexidine  Gluconate Cloth  6 each Topical Daily   feeding supplement (GLUCERNA SHAKE)  237 mL Oral TID BM   [START ON 11/07/2023] heparin  injection (subcutaneous)  5,000 Units Subcutaneous Q8H   insulin  aspart  0-15 Units Subcutaneous TID WC   insulin  glargine  25 Units Subcutaneous Daily   metoprolol  succinate  25 mg Oral Daily   [START ON 11/07/2023] multivitamin with minerals  1 tablet Oral Daily   mupirocin  ointment  1 Application Nasal BID   pantoprazole  (PROTONIX ) IV  40 mg Intravenous Q12H   prasugrel  10 mg Oral Daily   rosuvastatin   20 mg Oral Daily   sodium chloride  flush  3 mL Intravenous Q12H   tamsulosin   0.4 mg Oral BID   [START ON 11/07/2023] thiamine  100 mg Oral Daily   Vitamin D  (Ergocalciferol )  50,000 Units Oral Q7 days    Infusions:  promethazine  (PHENERGAN ) injection (IM or IVPB) Stopped (11/04/23 1639)      Patient Profile   Patient presents with acute anterior STEMI with multiple prior medical issues.  Course complicated by acute systolic heart failure, acute renal failure.  Assessment/Plan   Acute systolic heart failure: Appears moderately volume overloaded with significant orthopnea.  Does not appear low output, blood pressure appears adequate for renal perfusion. - Ischemic, suspect significant residual scar given late presentation - Unable to tolerate significant GDMT given renal disease - Continue metoprolol  succinate 25 mg daily, not low output - Diuresis as below  Anterior STEMI: Late presenting, suspect will have significant infarct.  Residual RCA disease. - Will eventually need revascularization, but renal function is currently prohibitive - May have to be staged as an outpatient - Continue aspirin/prasugrel - Continue Crestor  20 mg daily  Acute renal failure: Significant prior renal disease.   Suspect combination cardiorenal syndrome as well as component of contrast-induced nephropathy. - Volume overloaded on exam, does appear to be making dilute urine - Despite creatinine worsening, needs continued diuresis, 80 IV Lasix x 1 - If creatinine continues to worsen will obtain urine labs tomorrow  Hypertension: Relatively well-controlled  Medication concerns reviewed with patient and pharmacy team. Barriers identified: Acute renal failure  Length of Stay: 3  Morene JINNY Brownie, MD  11/06/2023, 9:57 PM    Advanced Heart Failure Team Pager [phone] (M-F; 7a - 5p)  Please contact [facility] for night-coverage after hours (4p -7a ) and weekends on [email or web address]

## 2023-11-06 NOTE — Progress Notes (Signed)
 PHARMACY CONSULT NOTE - FOLLOW UP  Pharmacy Consult for Electrolyte Monitoring and Replacement   Recent Labs: Potassium (mmol/L)  Date Value  11/06/2023 3.9   Magnesium (mg/dL)  Date Value  90/70/7974 2.3   Calcium  (mg/dL)  Date Value  90/70/7974 7.8 (L)   Albumin (g/dL)  Date Value  90/70/7974 1.9 (L)   Phosphorus (mg/dL)  Date Value  90/70/7974 5.1 (H)   Sodium (mmol/L)  Date Value  11/06/2023 135     Assessment: 58 y.o. male with medical history significant for rectal cancer s/p Rectosigmoid resection with colostomy, insulin -dependent type 2 diabetes, CKD lllb, HTN, BPH, recent hospitalization (7/5-7/13/2025) with sepsis secondary to MRSA cellulitis/abscess left foot s/p I&D/antibiotics who is admitted with primary diagnosis of DKA and mild acute pancreatitis and subsequently a late presenting anterior STEMI. Pharmacy is asked to follow and replace electrolytes while in CCU  Goal of Therapy:  Potassium 4.0 - 5.1 mmol/L Magnesium 2.0 - 2.4 mg/dL All Other Electrolytes WNL   Plan:  ---40 mEq po KCl x 1 ---recheck electrolytes in am    Ransom Blanch PGY-1 Pharmacy Resident  [place] [facility]  11/06/2023 7:39 AM

## 2023-11-07 DIAGNOSIS — E1110 Type 2 diabetes mellitus with ketoacidosis without coma: Secondary | ICD-10-CM | POA: Diagnosis not present

## 2023-11-07 LAB — CBC
HCT: 27.7 % — ABNORMAL LOW (ref 39.0–52.0)
Hemoglobin: 9 g/dL — ABNORMAL LOW (ref 13.0–17.0)
MCH: 28.3 pg (ref 26.0–34.0)
MCHC: 32.5 g/dL (ref 30.0–36.0)
MCV: 87.1 fL (ref 80.0–100.0)
Platelets: 188 K/uL (ref 150–400)
RBC: 3.18 MIL/uL — ABNORMAL LOW (ref 4.22–5.81)
RDW: 15 % (ref 11.5–15.5)
WBC: 7.1 K/uL (ref 4.0–10.5)
nRBC: 0 % (ref 0.0–0.2)

## 2023-11-07 LAB — URINALYSIS, ROUTINE W REFLEX MICROSCOPIC
Bilirubin Urine: NEGATIVE
Glucose, UA: 150 mg/dL — AB
Ketones, ur: NEGATIVE mg/dL
Nitrite: NEGATIVE
Protein, ur: >=300 mg/dL — AB
RBC / HPF: >50 RBC/hpf (ref 0–5)
Specific Gravity, Urine: 1.017 (ref 1.005–1.030)
WBC, UA: >50 WBC/hpf (ref 0–5)
pH: 5 (ref 5.0–8.0)

## 2023-11-07 LAB — GLUCOSE, CAPILLARY
Glucose-Capillary: 161 mg/dL — ABNORMAL HIGH (ref 70–99)
Glucose-Capillary: 215 mg/dL — ABNORMAL HIGH (ref 70–99)
Glucose-Capillary: 193 mg/dL — ABNORMAL HIGH (ref 70–99)
Glucose-Capillary: 137 mg/dL — ABNORMAL HIGH (ref 70–99)
Glucose-Capillary: 160 mg/dL — ABNORMAL HIGH (ref 70–99)

## 2023-11-07 LAB — PHOSPHORUS: Phosphorus: 5.2 mg/dL — ABNORMAL HIGH (ref 2.5–4.6)

## 2023-11-07 LAB — BASIC METABOLIC PANEL WITH GFR
Anion gap: 12 (ref 5–15)
BUN: 80 mg/dL — ABNORMAL HIGH (ref 6–20)
CO2: 24 mmol/L (ref 22–32)
Calcium: 8.1 mg/dL — ABNORMAL LOW (ref 8.9–10.3)
Chloride: 98 mmol/L (ref 98–111)
Creatinine, Ser: 4.96 mg/dL — ABNORMAL HIGH (ref 0.61–1.24)
GFR, Estimated: 13 mL/min — ABNORMAL LOW (ref >60)
Glucose, Bld: 176 mg/dL — ABNORMAL HIGH (ref 70–99)
Potassium: 4.1 mmol/L (ref 3.5–5.1)
Sodium: 134 mmol/L — ABNORMAL LOW (ref 135–145)

## 2023-11-07 LAB — MAGNESIUM: Magnesium: 2.7 mg/dL — ABNORMAL HIGH (ref 1.7–2.4)

## 2023-11-07 MED ORDER — BISACODYL 5 MG PO TBEC
10.0000 mg | DELAYED_RELEASE_TABLET | Freq: Once | ORAL | Status: AC
  Administered 2023-11-07: 10 mg via ORAL
  Filled 2023-11-07: qty 2

## 2023-11-07 MED ORDER — INSULIN ASPART 100 UNIT/ML IJ SOLN
3.0000 [IU] | Freq: Three times a day (TID) | INTRAMUSCULAR | Status: DC
  Administered 2023-11-07 – 2023-11-09 (×3): 3 [IU] via SUBCUTANEOUS
  Filled 2023-11-07 (×3): qty 1

## 2023-11-07 MED ORDER — BISACODYL 10 MG RE SUPP: 10.0000 mg | Freq: Every day | RECTAL | Status: DC | PRN

## 2023-11-07 MED ORDER — BISACODYL 5 MG PO TBEC
10.0000 mg | DELAYED_RELEASE_TABLET | Freq: Every day | ORAL | Status: DC
  Administered 2023-11-07: 10 mg via ORAL
  Filled 2023-11-07: qty 2

## 2023-11-07 MED ORDER — SODIUM CHLORIDE 0.9 % IV SOLN
1.0000 g | INTRAVENOUS | Status: DC
  Administered 2023-11-07 – 2023-11-08 (×2): 1 g via INTRAVENOUS
  Filled 2023-11-07 (×3): qty 10

## 2023-11-07 MED ORDER — POLYETHYLENE GLYCOL 3350 17 G PO PACK
17.0000 g | PACK | Freq: Two times a day (BID) | ORAL | Status: DC
  Administered 2023-11-07 – 2023-11-11 (×6): 17 g via ORAL
  Filled 2023-11-07 (×9): qty 1

## 2023-11-07 NOTE — Progress Notes (Signed)
 [facility] Progress Note  Patient: Jason Jennings    FMW:[id number]  DOA: 11/02/2023     Date of Service: the patient was seen and examined on 11/07/2023  Chief Complaint  Patient presents with   Emesis   Hyperglycemia   Brief hospital course: Jason Jennings is a 58 y.o. 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   He presented with 3 days of nausea, nonbloody, nonbilious, non-coffee-ground vomiting and left upper abdominal pain .  Denies fever or chills, cough or chest pain or change in bowel habits.  Emesis is nonbloody and nonbilious.  Has a history of pancreatitis about 12 years prior.  Denies alcohol use. In the ED tachycardic to 115 with otherwise normal vitals. Labs notable for lipase 300 with LFTs notable for AST 122 otherwise normal. Glucose 513 with anion gap of 23, bicarb 17, venous pH 7.36 and beta hydroxybutyric acid of 3.74. Creatinine 3.29, up from baseline of 2.27 WBC 14.8 and hemoglobin 11.8(Baseline 10-11) UA pending CT abdomen and pelvis for the most part nonacute-please see full report   Patient started on IV hydration, insulin  infusion and potassium replacement per Endo tool. Admission requested.    Assessment and Plan:  # STEMI, found to have EKG changes on 9/27 Troponin >24,000 EKG: ST elevation in V2/V3 and II, III and AVF It seems that patient was having STEMI on the telemetry since early morning and I think that he was having MI since arrival, atypical presentation with abdominal pain and nausea/vomiting.  Findings were masked by diabetic ketoacidosis and slightly elevated lipase. EKG and Trop were not done in the ED on admission. It was a late  presentation of the STEMI Cardio consulted, STEMI code was activated and patient was taken to cardiac Cath Lab S/p LAD stent As per cardiology continue Aggrastat infusion for 6 hours without heparin . Recommend dual antiplatelet therapy for at least 12 months. Once his medical issues and renal function improved, we will consider proceeding with RCA PCI.  Continue aspirin 81 mg p.o. daily, prasugrel 10 mg p.o. daily 9/28 left hand numbness, 1st 3 fingers,gradually improving.  MRI negative for any acute findings.   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 daily as per cards  # Acute renal failure superimposed on stage 3b chronic kidney disease (HCC) Likely ATN from volume depletion related to fluid losses from vomiting S/p IV fluid resuscitation Expecting improvement to baseline with treatment 9/29 creatinine 4.1, elevated, Discontinued IV Lasix today 9/30 sCr 4.96, elevated Consulted nephrology   # Diabetic ketoacidosis without coma associated with type 2 diabetes mellitus (HCC) S/p insulin  IV infusion and IVF as per DKA protocol Anion gap closed, transition to subcu insulin  9/26 started Lantus  20 units subcu daily NovoLog  sliding scale Monitor CBG, continue diabetic diet    # Acute pancreatitis # Intractable nausea and vomiting due to pancreatitis vs DKA vs gastroparesis. Resolved  Elevated lipase Mild elevation in lipase could be reactive Zofran  with Phenergan  for breakthrough S/p Reglan  10 mg q6 x  2 doses given  IV Protonix  S/p IV hydration     # Hypokalemia:  Replete cautiously due to renal failure # Hypophosphatemia: repleted orally Check electrolytes daily  # Intermittent urinary retention 9/27 bladder scan >400 ml Foley catheter was  inserted, 675 ml urine was collected Continue Foley catheter for 1 week Start Flomax  0.4 mg p.o. 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 Monitor BP and titrate medications accordingly   # Anemia of chronic disease 04/17/23 iron level within normal range Folate and B12 within normal range Monitor H&H  # Vitamin D  insufficiency: started vitamin D  50,000 units p.o. weekly, follow with PCP to repeat vitamin D  level after 3 to 6 months.  # Constipation, started laxatives  Procedures  Cardiac cath on 9/27 1.  Late presenting anterior STEMI due to thromotic occlusion of the mid LAD.  In addition, there is moderate distal left main stenosis and significant mid RCA stenosis. 2.  Left ventricular angiography was not performed.  EF was severely reduced by echo. 3.  Severely elevated left ventricular end-diastolic pressure at 33 mmHg with no evidence of cardiogenic shock by blood pressure and with normal lactic acid. 4.  Successful angioplasty, aspiration thrombectomy and drug-eluting stent placement to the mid LAD.  IVUS was performed after and showed no clear evidence of edge dissection.   Recommendations: Dual antiplatelet therapy for at least 12 months. Will run Aggrastat infusion for 6 hours. Staged RCA PCI is recommended once medical issues and renal function improve.  I stopped all IV fluids and will give 1 dose of IV furosemide. The patient already has acute on chronic kidney disease and he is at risk of further worsening of this.  Body mass index is 31.99 kg/m.  Interventions:  Diet: Heart healthy/carb modified, fluid restriction 1.5L/day  DVT Prophylaxis: Subcutaneous Lovenox    Advance goals of care discussion: Full  code  Family Communication: family was present at bedside, at the time of interview.  The pt provided permission to discuss medical plan with the family. Opportunity was given to ask question and all questions were answered satisfactorily.  9/27 discussed with patient's sister over the phone 9/28 discussed with patient's sister at bedside  Disposition:  Pt is from Home, admitted with DKA, and STEMI, developed AKI, still has elevated creatinine, which precludes a safe discharge. Discharge to home, when stable, may need few days to improve  Subjective: No significant events overnight, patient is passing gas had not have BM in several days, denies any abdominal pain.  No chest pain and shortness of breath is getting better, denied any other active issues.   Physical Exam: General: NAD, lying comfortably Appear in no distress, affect appropriate Eyes: PERRLA ENT: Oral Mucosa Clear, moist  Neck: no JVD,  Cardiovascular: S1 and S2 Present, no Murmur, Tachy/PVCs Respiratory: good respiratory effort, Bilateral Air entry equal and Decreased, no Crackles, no wheezes Abdomen: Bowel Sound present, Soft and mild epigastric/mid abdomen tenderness,  Skin: no rashes Extremities: 1-2+ Pedal edema, no calf tenderness Neurologic: without any new focal findings Gait not checked due to patient safety concerns  Vitals:   11/07/23 1200 11/07/23 1300 11/07/23 1400 11/07/23 1500  BP: 138/75 133/67 (!) 133/59 (!) 140/70  Pulse: 75 80 81 86  Resp:  13 20 (!) 21 15  Temp: 98.2 F (36.8 C)     TempSrc: Oral     SpO2: 98% 93% 100% 99%  Weight:      Height:        Intake/Output Summary (Last 24 hours) at 11/07/2023 1702 Last data filed at 11/07/2023 1225 Gross per 24 hour  Intake 423 ml  Output 420 ml  Net 3 ml   Filed Weights   11/02/23 2355 11/06/23 1508 11/07/23 0500  Weight: 91.4 kg 90.3 kg 89.9 kg    Data Reviewed: I have personally reviewed and interpreted daily labs, tele strips, imagings  as discussed above. I reviewed all nursing notes, pharmacy notes, vitals, pertinent old records I have discussed plan of care as described above with RN and patient/family.  CBC: Recent Labs  Lab 11/04/23 0506 11/04/23 1316 11/04/23 1726 11/05/23 0317 11/06/23 0441 11/07/23 0525  WBC 9.3 8.9  --  8.9 7.8 7.1  HGB 10.6* 9.7*  --  10.2* 9.8* 9.0*  HCT 31.8* 29.0*  --  30.8* 29.1* 27.7*  MCV 88.1 85.5  --  88.5 85.1 87.1  PLT 191 193 192 193 180 188   Basic Metabolic Panel: Recent Labs  Lab 11/03/23 1021 11/03/23 1445 11/04/23 0506 11/04/23 1316 11/05/23 0317 11/06/23 0441 11/06/23 1442 11/07/23 0525  NA 140   < > 138  --  139 135 132* 134*  K 3.2*   < > 3.9  --  3.1* 3.9 4.1 4.1  CL 100   < > 103  --  102 102 96* 98  CO2 26   < > 23  --  23 23 22 24   GLUCOSE 152*   < > 290*  --  103* 149* 310* 176*  BUN 53*   < > 57*  --  54* 65* 75* 80*  CREATININE 3.02*   < > 3.02* 3.04* 2.84* 4.10* 4.61* 4.96*  CALCIUM  8.7*   < > 7.8*  --  7.9* 7.8* 7.8* 8.1*  MG 2.1  --  2.2  --  2.2 2.3  --  2.7*  PHOS 2.3*  --  4.6  --  4.3 5.1*  --  5.2*   < > = values in this interval not displayed.    Studies: No results found.   Scheduled Meds:  aspirin  81 mg Oral Daily   bisacodyl  10 mg Oral QHS   Chlorhexidine  Gluconate Cloth  6 each Topical Daily   feeding supplement (GLUCERNA SHAKE)  237 mL Oral TID BM   heparin  injection (subcutaneous)  5,000 Units Subcutaneous Q8H   insulin  aspart  0-15 Units Subcutaneous TID WC   insulin  aspart  0-5 Units Subcutaneous QHS   insulin  aspart  3 Units Subcutaneous TID WC   insulin  glargine  25 Units Subcutaneous Daily   metoprolol  succinate  25 mg Oral Daily   multivitamin with minerals  1 tablet Oral Daily   mupirocin  ointment  1 Application Nasal BID   pantoprazole  (PROTONIX ) IV  40 mg Intravenous Q12H   polyethylene glycol  17 g Oral BID   prasugrel  10 mg Oral Daily   rosuvastatin   20 mg Oral Daily   sodium chloride  flush  3 mL  Intravenous Q12H   tamsulosin   0.4 mg Oral BID   thiamine  100 mg Oral Daily   Vitamin D  (Ergocalciferol )  50,000 Units Oral Q7 days   Continuous Infusions:  cefTRIAXone  (ROCEPHIN )  IV 1 g (11/07/23 1501)   promethazine  (  PHENERGAN ) injection (IM or IVPB) Stopped (11/04/23 1639)   PRN Meds: acetaminophen , alum & mag hydroxide-simeth, bisacodyl, HYDROmorphone  (DILAUDID ) injection, ondansetron  (ZOFRAN ) IV, mouth rinse, promethazine  (PHENERGAN ) injection (IM or IVPB), sodium chloride  flush, traMADol   Time spent: 55 minutes  Author: ELVAN SOR. MD [facility] 11/07/2023 5:02 PM  To reach On-call, see care teams to locate the attending and reach out to them via [email or web address]. If 7PM-7AM, please contact night-coverage If you still have difficulty reaching the attending provider, please page the [facility] (Director on Call) for [facility] on amion for assistance.

## 2023-11-07 NOTE — Inpatient Diabetes Management (Signed)
 Inpatient Diabetes Program Recommendations  AACE/ADA: New Consensus Statement on Inpatient Glycemic Control  Target Ranges:  Prepandial:   less than 140 mg/dL      Peak postprandial:   less than 180 mg/dL (1-2 hours)      Critically ill patients:  140 - 180 mg/dL    Latest Reference Range & Units 11/06/23 08:20 11/06/23 11:08 11/06/23 17:07 11/06/23 21:32  Glucose-Capillary 70 - 99 mg/dL 854 (H) 739 (H) 741 (H) 197 (H)   Review of Glycemic Control  Diabetes history: DM2 Outpatient Diabetes medications: Lantus  14 units daily, Humalog  75/25 30 units BID, FreeStyle Libre 3 CGM Current orders for Inpatient glycemic control:  Lantus  25 units daily, Novolog  0-15 units TID with meals, Novolog  0-5 units QHS   Inpatient Diabetes Program Recommendations:     Insulin :  Please consider adding Novolog  3 units TID with meals for meal coverage if patient eats at least 50% of meals.  Thanks, Earnie Gainer, RN, MSN, CDCES Diabetes Coordinator Inpatient Diabetes Program [phone] (Team Pager from 8am to 5pm)

## 2023-11-07 NOTE — Progress Notes (Signed)
   11/07/23 1500  Spiritual Encounters  Type of Visit Initial  Care provided to: Pt and family (Niece at bedside)  Referral source Nurse (RN/NT/LPN)  Reason for visit Advance directives  OnCall Visit Yes  Advance Directives (For Healthcare)  Does Patient Have a Medical Advance Directive? No  Would patient like information on creating a medical advance directive? No - Patient declined   Pt says his sister knows his wishes and that's all he needs.

## 2023-11-07 NOTE — Plan of Care (Signed)
  Problem: Education: Goal: Ability to describe self-care measures that may prevent or decrease complications (Diabetes Survival Skills Education) will improve Outcome: Progressing   Problem: Coping: Goal: Ability to adjust to condition or change in health will improve Outcome: Progressing   Problem: Health Behavior/Discharge Planning: Goal: Ability to identify and utilize available resources and services will improve Outcome: Progressing Goal: Ability to manage health-related needs will improve Outcome: Progressing   Problem: Metabolic: Goal: Ability to maintain appropriate glucose levels will improve Outcome: Progressing   Problem: Nutritional: Goal: Maintenance of adequate nutrition will improve Outcome: Progressing

## 2023-11-07 NOTE — Progress Notes (Signed)
 Advanced Heart Failure Rounding Note  Cardiologist: None  Chief Complaint: Chest pain, acute renal failure Subjective:    Sat up by the side of the bed yesterday, feeling somewhat better from a breathing standpoint.  Still extremely deconditioned and reports walking back to his bed felt like a marathon.   Objective:   Weight Range: 89.9 kg Body mass index is 31.99 kg/m.   Vital Signs:   Temp:  [98.1 F (36.7 C)-98.5 F (36.9 C)] 98.1 F (36.7 C) (09/30 0800) Pulse Rate:  [72-97] 97 (09/30 0930) Resp:  [9-23] 18 (09/30 0930) BP: (119-142)/(56-95) 126/73 (09/30 0800) SpO2:  [96 %-100 %] 97 % (09/30 0930) Weight:  [89.9 kg-90.3 kg] 89.9 kg (09/30 0500) Last BM Date : 11/01/23  Weight change: Filed Weights   11/02/23 2355 11/06/23 1508 11/07/23 0500  Weight: 91.4 kg 90.3 kg 89.9 kg    Intake/Output:   Intake/Output Summary (Last 24 hours) at 11/07/2023 1052 Last data filed at 11/07/2023 0951 Gross per 24 hour  Intake 843 ml  Output 525 ml  Net 318 ml      Physical Exam    GENERAL: NAD, chronically ill appearing PULM:  Normal work of breathing, CTAB CARDIAC:  JVP: Flat         Normal rate with regular rhythm. No murmurs, rubs or gallops.  Trace edema. Warm and well perfused extremities. ABDOMEN: Nontender NEUROLOGIC: Patient is oriented x3 with no focal or lateralizing neurologic deficits.     Medications:     Scheduled Medications:  aspirin  81 mg Oral Daily   bisacodyl  10 mg Oral QHS   bisacodyl  10 mg Oral Once   Chlorhexidine  Gluconate Cloth  6 each Topical Daily   feeding supplement (GLUCERNA SHAKE)  237 mL Oral TID BM   heparin  injection (subcutaneous)  5,000 Units Subcutaneous Q8H   insulin  aspart  0-15 Units Subcutaneous TID WC   insulin  aspart  0-5 Units Subcutaneous QHS   insulin  glargine  25 Units Subcutaneous Daily   metoprolol  succinate  25 mg Oral Daily   multivitamin with minerals  1 tablet Oral Daily   mupirocin  ointment  1  Application Nasal BID   pantoprazole  (PROTONIX ) IV  40 mg Intravenous Q12H   polyethylene glycol  17 g Oral BID   prasugrel  10 mg Oral Daily   rosuvastatin   20 mg Oral Daily   sodium chloride  flush  3 mL Intravenous Q12H   tamsulosin   0.4 mg Oral BID   thiamine  100 mg Oral Daily   Vitamin D  (Ergocalciferol )  50,000 Units Oral Q7 days    Infusions:  promethazine  (PHENERGAN ) injection (IM or IVPB) Stopped (11/04/23 1639)    PRN Medications: acetaminophen , alum & mag hydroxide-simeth, bisacodyl, HYDROmorphone  (DILAUDID ) injection, ondansetron  (ZOFRAN ) IV, mouth rinse, promethazine  (PHENERGAN ) injection (IM or IVPB), sodium chloride  flush, traMADol     Patient Profile   Jason Jennings is a 58 y.o. male with a PMH of colon cancer s/p LAR, CKD stage III who presents with late presenting anterior STEMI complicated by acute renal failure.   Assessment/Plan   Acute systolic heart failure: Improving volume on exam, given worsening renal function and improvement in hypoxia will hold further diuresis today. - Ischemic, suspect significant residual scar given late presentation - Unable to tolerate significant GDMT given renal disease -Targeting slightly higher MAP goal given acute renal failure, if MAP consistently above 90 will consider starting low-dose hydralazine/Isordil - Continue metoprolol  succinate 25 mg daily, does not  appear low output   Anterior STEMI: Late presenting, suspect will have significant infarct.  Residual RCA disease. - Will eventually need revascularization, but renal function is currently prohibitive - May have to be staged as an outpatient - Continue aspirin/prasugrel - Continue Crestor  20 mg daily   Acute renal failure: Significant prior renal disease.  Worsening today, though still has dilute urine in his catheter.  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 Barriers identified: Worsening renal function  Length of Stay: 4  Morene JINNY Brownie, MD  11/07/2023, 10:52 AM  Advanced Heart Failure Team Pager [phone] (M-F; 7a - 5p)  Please contact [facility] for night-coverage after hours (5p -7a ) and weekends on [email or web address]

## 2023-11-07 NOTE — Plan of Care (Signed)
  Problem: Education: Goal: Ability to describe self-care measures that may prevent or decrease complications (Diabetes Survival Skills Education) will improve Outcome: Progressing   Problem: Coping: Goal: Ability to adjust to condition or change in health will improve Outcome: Progressing   Problem: Health Behavior/Discharge Planning: Goal: Ability to manage health-related needs will improve Outcome: Progressing   Problem: Skin Integrity: Goal: Risk for impaired skin integrity will decrease Outcome: Progressing   Problem: Tissue Perfusion: Goal: Adequacy of tissue perfusion will improve Outcome: Progressing   Problem: Cardiac: Goal: Ability to maintain an adequate cardiac output will improve Outcome: Progressing   Problem: Metabolic: Goal: Ability to maintain appropriate glucose levels will improve Outcome: Progressing   Problem: Health Behavior/Discharge Planning: Goal: Ability to manage health-related needs will improve Outcome: Progressing   Problem: Clinical Measurements: Goal: Respiratory complications will improve Outcome: Progressing Goal: Cardiovascular complication will be avoided Outcome: Progressing   Problem: Pain Managment: Goal: General experience of comfort will improve and/or be controlled Outcome: Progressing   Problem: Safety: Goal: Ability to remain free from injury will improve Outcome: Progressing

## 2023-11-07 NOTE — Consult Note (Signed)
 [facility]  CONSULT NOTE    Date: 11/07/2023                  Patient Name:  Jason Jennings  MRN: [id number]  DOB: 07-11-1965  Age / Sex: 58 y.o., male         PCP: Salli Amato, MD                 Service Requesting Consult: TRH                 Reason for Consult: Acute kidney injury             History of Present Illness: Jason Jennings is a 58 y.o.  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  Patient seen and evaluated in bedside in ICU.  Patient states he began having vomiting last Monday, unable to tolerate oral intake.  Patient states this was persistent until Thursday.  Patient reports fever by Thursday with mild abdominal pain.  Vomiting continued even with water.  Patient decided to seek medical attention at that time.  Patient denies any cough or chest pain.  Also denied any radiating pain.  Symptoms concerning for pancreatitis, labs indicated NSTEMI.  Creatinine on ED arrival 3.29, baseline 2.85 in July.  CT abdomen pelvis showed bilateral pleural effusion, no active inflammation in pancreas, and negative for hydronephrosis.  Chest x-ray shows vascular congestion with small pleural effusions.  Brain MRI negative.  Renal ultrasound negative for obstruction.  Patient was initially treated for dehydration, then received IV diuretics, along with IV contrast for heart catheterization.   Medications: Outpatient medications: Medications Prior to Admission  Medication Sig Dispense Refill Last Dose/Taking   acetaminophen  (TYLENOL ) 325 MG tablet Take 2 tablets (650 mg total) by mouth every 6 (six) hours as needed for mild pain (pain score 1-3) or  fever (or Fever >/= 101).   Unknown   fluticasone-salmeterol (ADVAIR) 250-50 MCG/ACT AEPB Inhale 1 puff into the lungs 2 (two) times daily.   Past Week   HUMALOG  MIX 75/25 KWIKPEN (75-25) 100 UNIT/ML KwikPen Inject 30 Units into the skin 2 (two) times daily with a meal. Qam and in the evening.      (He takes both Humalog  mix 75/25 BID and then Lantus  at bedtime) Pt reports taking to 25 units BID   Past Week   rosuvastatin  (CRESTOR ) 20 MG tablet Take 20 mg by mouth daily.   Past Week   tamsulosin  (FLOMAX ) 0.4 MG CAPS capsule Take 0.4 mg by mouth daily.   Past Week   amLODipine  (NORVASC ) 5 MG tablet Take 1 tablet (5 mg total) by mouth daily. (Patient not taking: Reported on 11/04/2023) 30 tablet 0 Not Taking   Continuous Glucose Receiver (FREESTYLE LIBRE 2 READER) DEVI 1 Box by Does not apply route 2 (two) times daily. 1 each 1    Continuous Glucose Receiver (FREESTYLE LIBRE 3 READER) DEVI 1 Box by Does not apply route 2 (two) times daily. 1 each 1    Continuous Glucose Sensor (FREESTYLE LIBRE 3 PLUS SENSOR) MISC Change sensor every 15 days. 1 each 1    Continuous Glucose Sensor (FREESTYLE LIBRE 3 PLUS SENSOR) MISC Change sensor every 15 days. 1 each 1  gabapentin  (NEURONTIN ) 100 MG capsule Take 100 mg by mouth 3 (three) times daily. (Patient not taking: Reported on 11/04/2023)   Not Taking   gentamicin  ointment (GARAMYCIN ) 0.1 % Apply topically daily. To L foot ulcer until healed (Patient not taking: Reported on 11/04/2023) 30 g 0 Not Taking   insulin  glargine (LANTUS  SOLOSTAR) 100 UNIT/ML Solostar Pen Inject 14 Units into the skin daily. Pt reports taking 12 units once daily (Patient not taking: Reported on 11/04/2023)   Not Taking   [Paused] losartan  (COZAAR ) 50 MG tablet Take 50 mg by mouth daily. (Patient not taking: No sig reported)      methocarbamol  (ROBAXIN ) 750 MG tablet Take 750 mg by mouth 2 (two) times daily as needed. (Patient not taking: Reported on 11/04/2023)   Not Taking   metoprolol   tartrate (LOPRESSOR ) 25 MG tablet Take 1 tablet (25 mg total) by mouth 2 (two) times daily. (Patient not taking: Reported on 11/04/2023) 60 tablet 0 Not Taking   Oxycodone  HCl 10 MG TABS Take 10 mg by mouth every 6 (six) hours as needed. (Patient not taking: Reported on 02/10/2023)   Not Taking   testosterone cypionate (DEPOTESTOTERONE CYPIONATE) 100 MG/ML injection INJECT 1/2 (ONE-HALF) ML INTRAMUSCULARLY EVERY TWO WEEKS (DISCARD VIAL AFTER 30 DAYS) (Patient not taking: Reported on 11/04/2023)   Not Taking   [Paused] torsemide (DEMADEX) 10 MG tablet Take 10 mg by mouth every Monday, Wednesday, Friday, and Saturday at 6 PM. (Patient not taking: No sig reported)       Current medications: Current Facility-Administered Medications  Medication Dose Route Frequency Provider Last Rate Last Admin   acetaminophen  (TYLENOL ) tablet 650 mg  650 mg Oral Q6H PRN Darron Grass A, MD   650 mg at 11/05/23 1021   alum & mag hydroxide-simeth (MAALOX/MYLANTA) 200-200-20 MG/5ML suspension 15 mL  15 mL Oral Q6H PRN Von Bellis, MD   15 mL at 11/05/23 0830   aspirin chewable tablet 81 mg  81 mg Oral Daily Arida, Muhammad A, MD   81 mg at 11/07/23 0935   bisacodyl (DULCOLAX) EC tablet 10 mg  10 mg Oral QHS Von Bellis, MD       bisacodyl (DULCOLAX) suppository 10 mg  10 mg Rectal Daily PRN Von Bellis, MD       cefTRIAXone  (ROCEPHIN ) 1 g in sodium chloride  0.9 % 100 mL IVPB  1 g Intravenous Q24H Von Bellis, MD   Stopped at 11/07/23 1540   Chlorhexidine  Gluconate Cloth 2 % PADS 6 each  6 each Topical Daily Darron Grass LABOR, MD   6 each at 11/07/23 0936   feeding supplement (GLUCERNA SHAKE) (GLUCERNA SHAKE) liquid 237 mL  237 mL Oral TID BM Von Bellis, MD   237 mL at 11/07/23 1434   heparin  injection 5,000 Units  5,000 Units Subcutaneous Q8H Von Bellis, MD   5,000 Units at 11/07/23 0935   HYDROmorphone  (DILAUDID ) injection 0.5 mg  0.5 mg Intravenous Q4H PRN Von Bellis, MD       insulin  aspart (novoLOG )  injection 0-15 Units  0-15 Units Subcutaneous TID WC Duncan, Hazel V, MD   3 Units at 11/07/23 1701   insulin  aspart (novoLOG ) injection 0-5 Units  0-5 Units Subcutaneous QHS Cleatus Delayne GAILS, MD       insulin  aspart (novoLOG ) injection 3 Units  3 Units Subcutaneous TID WC Von Bellis, MD   3 Units at 11/07/23 1701   insulin  glargine (LANTUS ) injection 25 Units  25 Units Subcutaneous Daily Arida, Muhammad  A, MD   25 Units at 11/07/23 9066   metoprolol  succinate (TOPROL -XL) 24 hr tablet 25 mg  25 mg Oral Daily Hammock, Tylene, NP   25 mg at 11/07/23 0934   multivitamin with minerals tablet 1 tablet  1 tablet Oral Daily Von Bellis, MD   1 tablet at 11/07/23 9065   mupirocin  ointment (BACTROBAN ) 2 % 1 Application  1 Application Nasal BID Darron Deatrice LABOR, MD   1 Application at 11/07/23 0936   ondansetron  (ZOFRAN ) injection 4 mg  4 mg Intravenous Q6H PRN Darron Deatrice LABOR, MD       Oral care mouth rinse  15 mL Mouth Rinse PRN Darron Deatrice LABOR, MD       pantoprazole  (PROTONIX ) injection 40 mg  40 mg Intravenous Q12H Von Bellis, MD   40 mg at 11/07/23 9065   polyethylene glycol (MIRALAX  / GLYCOLAX ) packet 17 g  17 g Oral BID Von Bellis, MD   17 g at 11/07/23 1222   prasugrel (EFFIENT) tablet 10 mg  10 mg Oral Daily Arida, Muhammad A, MD   10 mg at 11/07/23 0935   promethazine  (PHENERGAN ) 12.5 mg in sodium chloride  0.9 % 50 mL IVPB  12.5 mg Intravenous Q6H PRN Darron Deatrice LABOR, MD   Stopped at 11/04/23 1639   rosuvastatin  (CRESTOR ) tablet 20 mg  20 mg Oral Daily Darron Deatrice A, MD   20 mg at 11/06/23 2206   sodium chloride  flush (NS) 0.9 % injection 3 mL  3 mL Intravenous Q12H Darron Deatrice LABOR, MD   3 mL at 11/07/23 9062   sodium chloride  flush (NS) 0.9 % injection 3 mL  3 mL Intravenous PRN Darron Deatrice LABOR, MD       tamsulosin  (FLOMAX ) capsule 0.4 mg  0.4 mg Oral BID Von Bellis, MD   0.4 mg at 11/07/23 0935   thiamine (VITAMIN B1) tablet 100 mg  100 mg Oral Daily Von Bellis, MD    100 mg at 11/07/23 9065   traMADol  (ULTRAM ) tablet 25 mg  25 mg Oral Q6H PRN Von Bellis, MD   25 mg at 11/05/23 2111   Vitamin D  (Ergocalciferol ) (DRISDOL) 1.25 MG (50000 UNIT) capsule 50,000 Units  50,000 Units Oral Q7 days Darron Deatrice LABOR, MD          Allergies: No Known Allergies    Past Medical History: Past Medical History:  Diagnosis Date   Adenocarcinoma of rectum (HCC)    Adenocarcinoma of rectum (HCC)    Cancer (HCC)    Chronic kidney disease, stage 3b (HCC)    Diabetes mellitus due to underlying condition with diabetic autonomic neuropathy, unspecified whether long term insulin  use (HCC)    Diabetes mellitus without complication (HCC)    Diabetic polyneuropathy (HCC)    Diabetic retinopathy (HCC)    Neuropathy    S/P colostomy (HCC)    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)    Ulcer of left foot with fat layer exposed [facility])      Past Surgical History: Past Surgical History:  Procedure Laterality Date   CATARACT EXTRACTION W/PHACO Right 06/01/2023   Procedure: PHACOEMULSIFICATION, CATARACT, WITH IOL INSERTION 7.85, 00:57.4;  Surgeon: Enola Feliciano Hugger, MD;  [facility];  Service: Ophthalmology;  Laterality: Right;   CATARACT EXTRACTION W/PHACO Left 06/15/2023   Procedure: PHACOEMULSIFICATION, CATARACT, WITH IOL INSERTION 7.15 00:40.8;  Surgeon: Enola Feliciano Hugger, MD;  Location: [facility];  Service: Ophthalmology;  Laterality: Left;  COLON SURGERY     CORONARY ULTRASOUND/IVUS N/A 11/04/2023   Procedure: Coronary Ultrasound/IVUS;  Surgeon: Darron Deatrice LABOR, MD;  Location: [facility];  Service: Cardiovascular;  Laterality: N/A;   CORONARY/GRAFT ACUTE MI REVASCULARIZATION N/A 11/04/2023   Procedure: Coronary/Graft Acute MI Revascularization;  Surgeon: Darron Deatrice LABOR, MD;  Location: [facility];  Service: Cardiovascular;  Laterality: N/A;   LEFT HEART CATH AND CORONARY ANGIOGRAPHY N/A 11/04/2023    Procedure: LEFT HEART CATH AND CORONARY ANGIOGRAPHY;  Surgeon: Darron Deatrice LABOR, MD;  Location: [facility];  Service: Cardiovascular;  Laterality: N/A;     Family History: History reviewed. No pertinent family history.   Social History: Social History   Socioeconomic History   Marital status: Single    Spouse name: Not on file   Number of children: Not on file   Years of education: Not on file   Highest education level: Not on file  Occupational History   Not on file  Tobacco Use   Smoking status: Former    Current packs/day: 0.00    Types: Cigarettes    Quit date: 12/09/2014    Years since quitting: 8.9   Smokeless tobacco: Never  Vaping Use   Vaping status: Never Used  Substance and Sexual Activity   Alcohol use: Not Currently   Drug use: Not Currently    Types: Marijuana   Sexual activity: Not on file  Other Topics Concern   Not on file  Social History Narrative   Not on file   Social Drivers of Health   Financial Resource Strain: Medium Risk (08/25/2023)   Received from [facility]   Overall Financial Resource Strain (CARDIA)    Difficulty of Paying Living Expenses: Somewhat hard  Food Insecurity: No Food Insecurity (11/03/2023)   Hunger Vital Sign    Worried About Running Out of Food in the Last Year: Never true    Ran Out of Food in the Last Year: Never true  Transportation Needs: No Transportation Needs (11/03/2023)   PRAPARE - Administrator, Civil Service (Medical): No    Lack of Transportation (Non-Medical): No  Physical Activity: Insufficiently Active (05/25/2020)   Received from [facility]   Exercise Vital Sign    On average, how many days per week do you engage in moderate to strenuous exercise (like a brisk walk)?: 3 days    On average, how many minutes do you engage in exercise at this level?: 10 min  Stress: No Stress Concern Present (05/25/2020)   Received from [facility] of  Occupational Health - Occupational Stress Questionnaire    Feeling of Stress : Only a little  Social Connections: Socially Isolated (11/03/2023)   Social Connection and Isolation Panel    Frequency of Communication with Friends and Family: More than three times a week    Frequency of Social Gatherings with Friends and Family: More than three times a week    Attends Religious Services: Never    Database administrator or Organizations: No    Attends Banker Meetings: Never    Marital Status: Divorced  Catering manager Violence: Not At Risk (11/03/2023)   Humiliation, Afraid, Rape, and Kick questionnaire    Fear of Current or Ex-Partner: No    Emotionally Abused: No    Physically Abused: No    Sexually Abused: No     Review of Systems: Review of Systems  Constitutional:  Positive  for fever. Negative for chills and malaise/fatigue.  HENT:  Negative for congestion, sore throat and tinnitus.   Eyes:  Negative for blurred vision and redness.  Respiratory:  Negative for cough, shortness of breath and wheezing.   Cardiovascular:  Negative for chest pain, palpitations, claudication and leg swelling.  Gastrointestinal:  Positive for abdominal pain, nausea and vomiting. Negative for blood in stool and diarrhea.  Genitourinary:  Negative for flank pain, frequency and hematuria.  Musculoskeletal:  Negative for back pain, falls and myalgias.  Skin:  Negative for rash.  Neurological:  Negative for dizziness, weakness and headaches.  Endo/Heme/Allergies:  Does not bruise/bleed easily.  Psychiatric/Behavioral:  Negative for depression. The patient is not nervous/anxious and does not have insomnia.     Vital Signs: Blood pressure (!) 138/115, pulse 81, temperature 98.6 F (37 C), temperature source Oral, resp. rate (!) 23, height 5' 6 (1.676 m), weight 89.9 kg, SpO2 100%.  Weight trends: Filed Weights   11/02/23 2355 11/06/23 1508 11/07/23 0500  Weight: 91.4 kg 90.3 kg 89.9 kg     Physical Exam: General: NAD  Head: Normocephalic, atraumatic. Moist oral mucosal membranes  Eyes: Anicteric  Neck: Supple  Lungs:  Clear to auscultation, normal effort  Heart: Regular rate and rhythm  Abdomen:  Soft, nontender,   Extremities: Trace to 1+ peripheral edema.  Neurologic: Awake and alert  Skin: No lesions  Access: None     Lab results: Basic Metabolic Panel: Recent Labs  Lab 11/03/23 1021 11/03/23 1445 11/04/23 0506 11/04/23 1316 11/05/23 0317 11/06/23 0441 11/06/23 1442 11/07/23 0525  NA 140   < > 138  --  139 135 132* 134*  K 3.2*   < > 3.9  --  3.1* 3.9 4.1 4.1  CL 100   < > 103  --  102 102 96* 98  CO2 26   < > 23  --  23 23 22 24   GLUCOSE 152*   < > 290*  --  103* 149* 310* 176*  BUN 53*   < > 57*  --  54* 65* 75* 80*  CREATININE 3.02*   < > 3.02*   < > 2.84* 4.10* 4.61* 4.96*  CALCIUM  8.7*   < > 7.8*  --  7.9* 7.8* 7.8* 8.1*  MG 2.1  --  2.2  --  2.2 2.3  --  2.7*  PHOS 2.3*  --  4.6  --  4.3 5.1*  --  5.2*   < > = values in this interval not displayed.    Liver Function Tests: Recent Labs  Lab 11/04/23 0506 11/05/23 0317 11/06/23 0441  AST 79* 60* 56*  ALT 27 26 31   ALKPHOS 70 65 82  BILITOT 1.3* 0.7 0.5  PROT 5.4* 5.6* 5.5*  ALBUMIN 2.0* 2.1* 1.9*   Recent Labs  Lab 11/02/23 2115 11/04/23 0506 11/05/23 0317  LIPASE 300* 41 21   No results for input(s): AMMONIA in the last 168 hours.  CBC: Recent Labs  Lab 11/04/23 0506 11/04/23 1316 11/04/23 1726 11/05/23 0317 11/06/23 0441 11/07/23 0525  WBC 9.3 8.9  --  8.9 7.8 7.1  HGB 10.6* 9.7*  --  10.2* 9.8* 9.0*  HCT 31.8* 29.0*  --  30.8* 29.1* 27.7*  MCV 88.1 85.5  --  88.5 85.1 87.1  PLT 191 193   < > 193 180 188   < > = values in this interval not displayed.    Cardiac Enzymes: No results for input(s): CKTOTAL,  CKMB, CKMBINDEX, TROPONINI in the last 168 hours.  BNP: Invalid input(s): POCBNP  CBG: Recent Labs  Lab 11/06/23 1707 11/06/23 2132  11/07/23 0754 11/07/23 1120 11/07/23 1629  GLUCAP 258* 197* 161* 215* 193*    Microbiology: Results for orders placed or performed during the hospital encounter of 11/02/23  MRSA Next Gen by PCR, Nasal     Status: Abnormal   Collection Time: 11/03/23  6:15 AM   Specimen: Nasal Mucosa; Nasal Swab  Result Value Ref Range Status   MRSA by PCR Next Gen DETECTED (A) NOT DETECTED Final    Comment: RESULT CALLED TO, READ BACK BY AND VERIFIED WITH: HANNA SPROUT @0732  11/03/23 MJU (NOTE) The GeneXpert MRSA Assay (FDA approved for NASAL specimens only), is one component of a comprehensive MRSA colonization surveillance program. It is not intended to diagnose MRSA infection nor to guide or monitor treatment for MRSA infections. Test performance is not FDA approved in patients less than 29 years old. Performed at [facility], [address] [place], [place] [address]     Coagulation Studies: No results for input(s): LABPROT, INR in the last 72 hours.  Urinalysis: Recent Labs    11/07/23 1229  COLORURINE YELLOW*  LABSPEC 1.017  PHURINE 5.0  GLUCOSEU 150*  HGBUR MODERATE*  BILIRUBINUR NEGATIVE  KETONESUR NEGATIVE  PROTEINUR >=300*  NITRITE NEGATIVE  LEUKOCYTESUR LARGE*      Imaging: No results found.   Assessment & Plan: Jason Jennings is a 58 y.o.  male with , who was admitted to [facility] on 11/02/2023 for  Acute kidney injury on chronic kidney disease stage IV.  Baseline creatinine appears to be 2.85 with GFR 25 on 08/20/2023.  Acute kidney injury likely multifactorial from IV contrast exposure, dehydration, and diuresis.  Creatinine 3.29 on ED arrival.  Diuretics have been held.  IV contrast exposure on 9/27.  Decreased urine output noted, just over 400 mL in previous 24 hours.  Due to decreased EF, will hold IV hydration.  Patient encouraged to drink 36 to 40 ounces.  No acute indication for dialysis however patient agreeable if needed.  Will continue to  monitor for now.  Continue to avoid any further nephrotoxic agents and therapies, if possible.  Avoid hypotension.  2.  Hypokalemia/hyponatremia.  Likely secondary to kidney injury.  Continue to supplement as needed.  Potassium is corrected at this time, 4.1.  3.  Hypertension with chronic kidney disease.  Currently receiving with metoprolol  only.  Blood pressure acceptable  4. Diabetes mellitus type II with chronic kidney disease/renal manifestations: insulin  dependent. Home regimen includes Humalog  75/25 pin. Most recent hemoglobin A1c is 11.0 on 08/12/2023.     LOS: 4 Manolito Jurewicz 9/30/20255:33 PM

## 2023-11-08 DIAGNOSIS — E1110 Type 2 diabetes mellitus with ketoacidosis without coma: Secondary | ICD-10-CM

## 2023-11-08 DIAGNOSIS — I2109 ST elevation (STEMI) myocardial infarction involving other coronary artery of anterior wall: Secondary | ICD-10-CM | POA: Diagnosis not present

## 2023-11-08 DIAGNOSIS — I5021 Acute systolic (congestive) heart failure: Secondary | ICD-10-CM

## 2023-11-08 DIAGNOSIS — I10 Essential (primary) hypertension: Secondary | ICD-10-CM

## 2023-11-08 DIAGNOSIS — I2102 ST elevation (STEMI) myocardial infarction involving left anterior descending coronary artery: Secondary | ICD-10-CM

## 2023-11-08 LAB — HELIX PHARMACOGENOMICS (PGX) CLOPIDOGREL TEST

## 2023-11-08 LAB — BASIC METABOLIC PANEL WITH GFR
Anion gap: 14 (ref 5–15)
BUN: 82 mg/dL — ABNORMAL HIGH (ref 6–20)
CO2: 23 mmol/L (ref 22–32)
Calcium: 8.1 mg/dL — ABNORMAL LOW (ref 8.9–10.3)
Chloride: 98 mmol/L (ref 98–111)
Creatinine, Ser: 5.2 mg/dL — ABNORMAL HIGH (ref 0.61–1.24)
GFR, Estimated: 12 mL/min — ABNORMAL LOW (ref >60)
Glucose, Bld: 99 mg/dL (ref 70–99)
Potassium: 3.8 mmol/L (ref 3.5–5.1)
Sodium: 135 mmol/L (ref 135–145)

## 2023-11-08 LAB — CBC
HCT: 28.3 % — ABNORMAL LOW (ref 39.0–52.0)
Hemoglobin: 9.3 g/dL — ABNORMAL LOW (ref 13.0–17.0)
MCH: 28.2 pg (ref 26.0–34.0)
MCHC: 32.9 g/dL (ref 30.0–36.0)
MCV: 85.8 fL (ref 80.0–100.0)
Platelets: 195 K/uL (ref 150–400)
RBC: 3.3 MIL/uL — ABNORMAL LOW (ref 4.22–5.81)
RDW: 15.2 % (ref 11.5–15.5)
WBC: 4.7 K/uL (ref 4.0–10.5)
nRBC: 0 % (ref 0.0–0.2)

## 2023-11-08 LAB — GLUCOSE, CAPILLARY
Glucose-Capillary: 116 mg/dL — ABNORMAL HIGH (ref 70–99)
Glucose-Capillary: 196 mg/dL — ABNORMAL HIGH (ref 70–99)
Glucose-Capillary: 249 mg/dL — ABNORMAL HIGH (ref 70–99)
Glucose-Capillary: 281 mg/dL — ABNORMAL HIGH (ref 70–99)
Glucose-Capillary: 263 mg/dL — ABNORMAL HIGH (ref 70–99)

## 2023-11-08 LAB — LIPOPROTEIN A (LPA): Lipoprotein (a): 70.8 nmol/L — ABNORMAL HIGH (ref <75.0)

## 2023-11-08 MED ORDER — SENNA 8.6 MG PO TABS
1.0000 | ORAL_TABLET | Freq: Two times a day (BID) | ORAL | Status: DC
  Administered 2023-11-08 – 2023-11-09 (×3): 8.6 mg via ORAL
  Filled 2023-11-08 (×3): qty 1

## 2023-11-08 MED ORDER — METOPROLOL TARTRATE 5 MG/5ML IV SOLN
INTRAVENOUS | Status: AC
  Filled 2023-11-08: qty 5

## 2023-11-08 MED ORDER — AMIODARONE HCL IN DEXTROSE 360-4.14 MG/200ML-% IV SOLN
60.0000 mg/h | INTRAVENOUS | Status: DC
  Administered 2023-11-08 (×2): 60 mg/h via INTRAVENOUS
  Filled 2023-11-08 (×2): qty 200

## 2023-11-08 MED ORDER — SORBITOL 70 % SOLN
30.0000 mL | Freq: Once | Status: AC
  Administered 2023-11-08: 30 mL via ORAL
  Filled 2023-11-08: qty 30

## 2023-11-08 MED ORDER — MAGNESIUM SULFATE 2 GM/50ML IV SOLN
2.0000 g | Freq: Once | INTRAVENOUS | Status: AC
  Administered 2023-11-08: 2 g via INTRAVENOUS
  Filled 2023-11-08: qty 50

## 2023-11-08 MED ORDER — METOPROLOL TARTRATE 5 MG/5ML IV SOLN: 5.0000 mg | Freq: Once | INTRAVENOUS | Status: DC

## 2023-11-08 MED ORDER — AMIODARONE HCL IN DEXTROSE 360-4.14 MG/200ML-% IV SOLN
30.0000 mg/h | INTRAVENOUS | Status: DC
Start: 2023-11-08 — End: 2023-11-10
  Administered 2023-11-08: 30 mg/h via INTRAVENOUS

## 2023-11-08 MED ORDER — AMIODARONE LOAD VIA INFUSION
150.0000 mg | Freq: Once | INTRAVENOUS | Status: AC
  Administered 2023-11-08: 150 mg via INTRAVENOUS
  Filled 2023-11-08: qty 83.34

## 2023-11-08 NOTE — Evaluation (Signed)
 Occupational Therapy Evaluation Patient Details Name: Jason Jennings MRN: [id number] DOB: Jan 15, 1966 Today's Date: 11/08/2023   History of Present Illness   Pt is a 58 y/o M admitted on 11/02/23 after presenting with c/o N&V & LUQ abdominal pain. Pt is being treated for DKA & mild acute pancreatitis. Pt also found to have STEMI, cardiac cath 11/04/23. PMH: rectal CA s/p rectosigmoid resection with colostomy, IDDM2, CKD3B, HTN, BPH, recent hospitalization (08/12/23-08/20/23) with sepsis 2/2 MRSA cellulitis/abscess L foot s/p I&D/antibiotics     Clinical Impressions Chart reviewed to date, pt greeted semi supine in bed, agreeable to OT evaluation. PTA pt reports he is grossly MOD I-I with ADL/IADL, has PRN assist from family. He presents with deficits in strength, endurance, activity tolerance, affecting safe and optimal ADL completion. He will benefit from acute OT to address functional deficits and to facilitate return to PLOF.  Of note, after second standing attempt, pt HR 223 bpm and reports dizziness. It resolved after sitting down with HR in 80s bpm after sitting down. No further dizziness reported. Nurse notified.      If plan is discharge home, recommend the following:   A little help with walking and/or transfers;A little help with bathing/dressing/bathroom     Functional Status Assessment   Patient has had a recent decline in their functional status and demonstrates the ability to make significant improvements in function in a reasonable and predictable amount of time.     Equipment Recommendations   None recommended by OT     Recommendations for Other Services         Precautions/Restrictions   Precautions Precautions: Fall Precaution/Restrictions Comments: watch HR Restrictions Weight Bearing Restrictions Per Provider Order: No     Mobility Bed Mobility Overal bed mobility: Needs Assistance Bed Mobility: Supine to Sit     Supine to sit: Supervision, HOB  elevated          Transfers Overall transfer level: Needs assistance Equipment used: Rolling walker (2 wheels) Transfers: Sit to/from Stand, Bed to chair/wheelchair/BSC Sit to Stand: Contact guard assist           General transfer comment: static standing for approx 4 minutes, then an additional STS with CGA (where pt became dizzy, HR to 223 bpm after less than 1 minute)      Balance Overall balance assessment: Needs assistance Sitting-balance support: No upper extremity supported, Feet supported Sitting balance-Leahy Scale: Fair     Standing balance support: During functional activity, Bilateral upper extremity supported, Reliant on assistive device for balance Standing balance-Leahy Scale: Fair                             ADL either performed or assessed with clinical judgement   ADL Overall ADL's : Needs assistance/impaired Eating/Feeding: Supervision/ safety               Upper Body Dressing : Set up Upper Body Dressing Details (indicate cue type and reason): anticipate Lower Body Dressing: Maximal assistance   Toilet Transfer: Stand-pivot;Contact guard assist Toilet Transfer Details (indicate cue type and reason): simulated to bedside chair                 Vision Patient Visual Report: No change from baseline       Perception         Praxis         Pertinent Vitals/Pain Pain Assessment Pain Assessment: No/denies pain  Extremity/Trunk Assessment Upper Extremity Assessment Upper Extremity Assessment: Overall WFL for tasks assessed   Lower Extremity Assessment Lower Extremity Assessment: Generalized weakness   Cervical / Trunk Assessment Cervical / Trunk Assessment: Normal   Communication Communication Communication: No apparent difficulties   Cognition Arousal: Alert Behavior During Therapy: WFL for tasks assessed/performed Cognition: No apparent impairments                               Following  commands: Intact       Cueing  General Comments   Cueing Techniques: Verbal cues  HR to 223 bpm after second standing attempt with pt c/o dizziness, to 80s bpm after sitting down and symptoms resolved   Exercises Other Exercises Other Exercises: edu re role of OT, role of rehab, discharge recommendations   Shoulder Instructions      Home Living Family/patient expects to be discharged to:: Private residence Living Arrangements: Alone Available Help at Discharge: Family;Available PRN/intermittently Type of Home: House Home Access: Stairs to enter [employer] of Steps: 4 Entrance Stairs-Rails: Right;Left Home Layout: One level     Bathroom Shower/Tub: Chief Strategy Officer: Standard     Home Equipment: Agricultural consultant (2 wheels);Rollator (4 wheels);Cane - single point;Wheelchair - manual;Transport chair;Grab bars - tub/shower;Shower seat   Additional Comments: tub bar      Prior Functioning/Environment Prior Level of Function : Independent/Modified Independent             Mobility Comments: independent without AD, driving denies falls in the past 6 months ADLs Comments: MOD I-I in ADL/IADL    OT Problem List: Decreased strength;Cardiopulmonary status limiting activity;Decreased safety awareness;Decreased activity tolerance   OT Treatment/Interventions: Self-care/ADL training;Balance training;Therapeutic exercise;Therapeutic activities;Patient/family education;Visual/perceptual remediation/compensation;DME and/or AE instruction      OT Goals(Current goals can be found in the care plan section)   Acute Rehab OT Goals Patient Stated Goal: improve function OT Goal Formulation: With patient Time For Goal Achievement: 11/22/23 Potential to Achieve Goals: Good ADL Goals Pt Will Perform Grooming: with modified independence;sitting Pt Will Perform Lower Body Dressing: with modified independence;sitting/lateral leans;sit to/from stand Pt Will  Transfer to Toilet: with modified independence;ambulating Pt Will Perform Toileting - Clothing Manipulation and hygiene: with modified independence;sitting/lateral leans;sit to/from stand   OT Frequency:  Min 3X/week    Co-evaluation PT/OT/SLP Co-Evaluation/Treatment: Yes Reason for Co-Treatment: Complexity of the patient's impairments (multi-system involvement);To address functional/ADL transfers PT goals addressed during session: Mobility/safety with mobility;Balance;Proper use of DME OT goals addressed during session: ADL's and self-care      AM-PAC OT 6 Clicks Daily Activity     Outcome Measure Help from another person eating meals?: None Help from another person taking care of personal grooming?: None Help from another person toileting, which includes using toliet, bedpan, or urinal?: A Little Help from another person bathing (including washing, rinsing, drying)?: A Little Help from another person to put on and taking off regular upper body clothing?: None Help from another person to put on and taking off regular lower body clothing?: A Lot 6 Click Score: 20   End of Session Equipment Utilized During Treatment: Rolling walker (2 wheels) Nurse Communication: Mobility status  Activity Tolerance: Patient tolerated treatment well Patient left: in chair;with call bell/phone within reach  OT Visit Diagnosis: Other abnormalities of gait and mobility (R26.89)                Time: 1130-1150 OT Time  Calculation (min): 20 min Charges:  OT General Charges $OT Visit: 1 Visit OT Evaluation $OT Eval Moderate Complexity: 1 Mod  Therisa Sheffield, OTD OTR/L  11/08/23, 1:28 PM

## 2023-11-08 NOTE — Progress Notes (Signed)
 [facility]  ROUNDING NOTE   Subjective:   Patient seen laying in bed Performing arm exercises Appetite remains poor with some nausea Increased urine output  Creatinine 5.20  Objective:  Vital signs in last 24 hours:  Temp:  [98.2 F (36.8 C)-98.9 F (37.2 C)] 98.7 F (37.1 C) (10/01 0800) Pulse Rate:  [72-100] 100 (10/01 1000) Resp:  [10-27] 27 (10/01 1000) BP: (109-143)/(56-115) 141/75 (10/01 1000) SpO2:  [93 %-100 %] 98 % (10/01 1000) Weight:  [88 kg] 88 kg (10/01 0600)  Weight change: -2.3 kg Filed Weights   11/06/23 1508 11/07/23 0500 11/08/23 0600  Weight: 90.3 kg 89.9 kg 88 kg    Intake/Output: I/O last 3 completed shifts: In: 796.3 [P.O.:660; I.V.:6; IV Piggyback:130.3] Out: 1095 [Urine:1095]   Intake/Output this shift:  No intake/output data recorded.  Physical Exam: General: NAD  Head: Normocephalic, atraumatic. Moist oral mucosal membranes  Eyes: Anicteric  Lungs:  Clear to auscultation  Heart: Regular rate and rhythm  Abdomen:  Soft, nontender  Extremities:  peripheral edema.  Neurologic: Awake, alert, conversant  Skin: Warm,dry, no rash       Basic Metabolic Panel: Recent Labs  Lab 11/03/23 1021 11/03/23 1445 11/04/23 0506 11/04/23 1316 11/05/23 0317 11/06/23 0441 11/06/23 1442 11/07/23 0525 11/08/23 0454  NA 140   < > 138  --  139 135 132* 134* 135  K 3.2*   < > 3.9  --  3.1* 3.9 4.1 4.1 3.8  CL 100   < > 103  --  102 102 96* 98 98  CO2 26   < > 23  --  23 23 22 24 23   GLUCOSE 152*   < > 290*  --  103* 149* 310* 176* 99  BUN 53*   < > 57*  --  54* 65* 75* 80* 82*  CREATININE 3.02*   < > 3.02*   < > 2.84* 4.10* 4.61* 4.96* 5.20*  CALCIUM  8.7*   < > 7.8*  --  7.9* 7.8* 7.8* 8.1* 8.1*  MG 2.1  --  2.2  --  2.2 2.3  --  2.7*  --   PHOS 2.3*  --  4.6  --  4.3 5.1*  --  5.2*  --    < > = values in this interval not displayed.    Liver Function Tests: Recent Labs  Lab 11/02/23 2315 11/04/23 0506 11/05/23 0317  11/06/23 0441  AST 122* 79* 60* 56*  ALT 40 27 26 31   ALKPHOS 78 70 65 82  BILITOT 1.1 1.3* 0.7 0.5  PROT 6.2* 5.4* 5.6* 5.5*  ALBUMIN 2.6* 2.0* 2.1* 1.9*   Recent Labs  Lab 11/02/23 2115 11/04/23 0506 11/05/23 0317  LIPASE 300* 41 21   No results for input(s): AMMONIA in the last 168 hours.  CBC: Recent Labs  Lab 11/04/23 1316 11/04/23 1726 11/05/23 0317 11/06/23 0441 11/07/23 0525 11/08/23 0454  WBC 8.9  --  8.9 7.8 7.1 4.7  HGB 9.7*  --  10.2* 9.8* 9.0* 9.3*  HCT 29.0*  --  30.8* 29.1* 27.7* 28.3*  MCV 85.5  --  88.5 85.1 87.1 85.8  PLT 193 192 193 180 188 195    Cardiac Enzymes: No results for input(s): CKTOTAL, CKMB, CKMBINDEX, TROPONINI in the last 168 hours.  BNP: Invalid input(s): POCBNP  CBG: Recent Labs  Lab 11/07/23 1629 11/07/23 2141 11/07/23 2219 11/08/23 0745 11/08/23 1127  GLUCAP 193* 160* 137* 116* 196*    Microbiology: Results  for orders placed or performed during the hospital encounter of 11/02/23  MRSA Next Gen by PCR, Nasal     Status: Abnormal   Collection Time: 11/03/23  6:15 AM   Specimen: Nasal Mucosa; Nasal Swab  Result Value Ref Range Status   MRSA by PCR Next Gen DETECTED (A) NOT DETECTED Final    Comment: RESULT CALLED TO, READ BACK BY AND VERIFIED WITH: HANNA SPROUT @0732  11/03/23 MJU (NOTE) The GeneXpert MRSA Assay (FDA approved for NASAL specimens only), is one component of a comprehensive MRSA colonization surveillance program. It is not intended to diagnose MRSA infection nor to guide or monitor treatment for MRSA infections. Test performance is not FDA approved in patients less than 42 years old. Performed at [facility], [address] [place], [place] [address]     Coagulation Studies: No results for input(s): LABPROT, INR in the last 72 hours.  Urinalysis: Recent Labs    11/07/23 1229  COLORURINE YELLOW*  LABSPEC 1.017  PHURINE 5.0  GLUCOSEU 150*  HGBUR MODERATE*   BILIRUBINUR NEGATIVE  KETONESUR NEGATIVE  PROTEINUR >=300*  NITRITE NEGATIVE  LEUKOCYTESUR LARGE*      Imaging: No results found.   Medications:    cefTRIAXone  (ROCEPHIN )  IV Stopped (11/07/23 1540)   promethazine  (PHENERGAN ) injection (IM or IVPB) Stopped (11/04/23 1639)    aspirin  81 mg Oral Daily   Chlorhexidine  Gluconate Cloth  6 each Topical Daily   feeding supplement (GLUCERNA SHAKE)  237 mL Oral TID BM   heparin  injection (subcutaneous)  5,000 Units Subcutaneous Q8H   insulin  aspart  0-15 Units Subcutaneous TID WC   insulin  aspart  0-5 Units Subcutaneous QHS   insulin  aspart  3 Units Subcutaneous TID WC   insulin  glargine  25 Units Subcutaneous Daily   metoprolol  succinate  25 mg Oral Daily   multivitamin with minerals  1 tablet Oral Daily   pantoprazole  (PROTONIX ) IV  40 mg Intravenous Q12H   polyethylene glycol  17 g Oral BID   prasugrel  10 mg Oral Daily   rosuvastatin   20 mg Oral Daily   senna  1 tablet Oral BID   sodium chloride  flush  3 mL Intravenous Q12H   tamsulosin   0.4 mg Oral BID   thiamine  100 mg Oral Daily   Vitamin D  (Ergocalciferol )  50,000 Units Oral Q7 days   acetaminophen , alum & mag hydroxide-simeth, bisacodyl, HYDROmorphone  (DILAUDID ) injection, ondansetron  (ZOFRAN ) IV, mouth rinse, promethazine  (PHENERGAN ) injection (IM or IVPB), sodium chloride  flush, traMADol   Assessment/ Plan:  [name] is a 58 y.o.  male with past medical conditions including type 2 diabetes, hypertension, BPH, rectal cancer with rectosigmoid resection resulting in colostomy, and chronic kidney disease stage IIIb, who was admitted to [facility] on 11/02/2023 for DKA (diabetic ketoacidosis) (HCC) [E11.10]   Acute kidney injury on chronic kidney disease stage IV.  Baseline creatinine appears to be 2.85 with GFR 25 on 08/20/2023.  Acute kidney injury likely multifactorial from IV contrast exposure, dehydration, and diuresis.  Creatinine 3.29 on ED arrival.  Diuretics  have been held.  IV contrast exposure on 9/27.   Creatinine increased today but urine output increased as well. Patient encouraged to maintain oral intake, 32-40oz of fluids. No acute indication for dialysis. Will continue to monitor for now.   Lab Results  Component Value Date   CREATININE 5.20 (H) 11/08/2023   CREATININE 4.96 (H) 11/07/2023   CREATININE 4.61 (H) 11/06/2023    Intake/Output Summary (Last 24  hours) at 11/08/2023 1151 Last data filed at 11/08/2023 0600 Gross per 24 hour  Intake 373.33 ml  Output 695 ml  Net -321.67 ml   2.  Hypokalemia/hyponatremia.  Likely secondary to kidney injury.  Continue to supplement as needed.  corrected   3.  Hypertension with chronic kidney disease.  Currently receiving with metoprolol  only.  Blood pressure 128/75   4. Diabetes mellitus type II with chronic kidney disease/renal manifestations: insulin  dependent. Home regimen includes Humalog  75/25 pin. Most recent hemoglobin A1c is 11.0 on 08/12/2023.     LOS: 5 Jason Jennings 10/1/202511:51 AM

## 2023-11-08 NOTE — Plan of Care (Signed)
  Problem: Education: Goal: Ability to describe self-care measures that may prevent or decrease complications (Diabetes Survival Skills Education) will improve Outcome: Progressing   Problem: Coping: Goal: Ability to adjust to condition or change in health will improve Outcome: Progressing   Problem: Fluid Volume: Goal: Ability to maintain a balanced intake and output will improve Outcome: Progressing   Problem: Health Behavior/Discharge Planning: Goal: Ability to manage health-related needs will improve Outcome: Progressing   Problem: Metabolic: Goal: Ability to maintain appropriate glucose levels will improve Outcome: Progressing   Problem: Nutritional: Goal: Maintenance of adequate nutrition will improve Outcome: Progressing   Problem: Skin Integrity: Goal: Risk for impaired skin integrity will decrease Outcome: Progressing   Problem: Respiratory: Goal: Will regain and/or maintain adequate ventilation Outcome: Progressing

## 2023-11-08 NOTE — Progress Notes (Addendum)
 Patient converted to sinus rhythm at 14:09, 12 lead EKG preformed

## 2023-11-08 NOTE — Progress Notes (Signed)
 PROGRESS NOTE    Maan Zarcone   FMW:[id number] DOB: [birth year]  DOA: 11/02/2023 Date of Service: 11/08/23 which is hospital day 5  PCP: Salli Amato, MD    Hospital course / significant events:   HPI: Tyrek Lawhorn is a 58 y.o. 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   He presented with 3 days of nausea, nonbloody, nonbilious, non-coffee-ground vomiting and left upper abdominal pain .  Denies fever or chills, cough or chest pain or change in bowel habits.  Emesis is nonbloody and nonbilious.  Has a history of pancreatitis about 12 years prior.  Denies alcohol use.   09/25: to ED. (+)DKA.  09/26: on insulin  gtt 09/27: (+)STEMI -  of note, EKG/troponin not done in ED and suspect late presentation. Taken for cath/PCI w/ stent to LAD. TTE LVEF 20 to 25%, global hypokinesis, mild to moderate TR. DC'd IV fluid, careful diuresis w/ renal fxn. Urinary retention w/ bladder scan >400 ml. Foley catheter was inserted, 675 ml urine was collected 09/28: L hand numbness, MRI brain no CVA.  09/29: creatinine 4.1, elevated, Discontinued IV Lasix today 09/30: sCr 4.96, elevated, consulted nephrology  10/01: into new Afib RVR but converted back to sinus on amio, will leave on amio gtt for now    Consultants:  Cardiology  Heart Failure team  Nephrology   Procedures/Surgeries: Cardiac cath w/ PCI       ASSESSMENT & PLAN:   STEMI found to have EKG changes on 9/27, likely late presenting STEMI in setting of ongoing ACS likely w/ MI since arrival, atypical presentation with abdominal pain and nausea/vomiting.  Findings were masked by diabetic ketoacidosis and slightly elevated  lipase. EKG and Trop were not done in the ED on admission. S/p LAD stent DAPT at least 12 months. Aspirin 81 mg p.o. daily, prasugrel 10 mg p.o. daily Once his medical issues and renal function improved, we will consider proceeding with RCA PCI.   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 TTE: LVEF 20 to 25%, global hypokinesis, mild to moderate TR Caution w/ diuresis and GDMT given higher MAP goal w/ renal fxn, renal fxn itself also impacting Rx    Acute renal failure superimposed on stage 3b chronic kidney disease Likely ATN from volume depletion related to fluid losses from vomiting S/p IV fluid resuscitation, stopped w/ low EF  Nephrology following   Diabetic ketoacidosis without coma associated with type 2 diabetes mellitus - DKA resolved w/ tx  Lantus  + NovoLog  sliding scale Monitor CBG, continue diabetic diet   Acute pancreatitis Elevated lipase (question reactive) Intractable nausea and vomiting due to pancreatitis vs DKA vs gastroparesis. Resolved  PPI Po hydration     Hypokalemia:  Replete cautiously due to renal failure Hypophosphatemia: repleted orally Check electrolytes daily   Intermittent urinary retention 9/27 bladder scan >400 ml Foley catheter was inserted, 675 ml urine was collected Continue Foley catheter for 1 week Start Flomax  0.4 mg p.o. 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 Monitor BP and titrate medications accordingly   Anemia of chronic  disease 04/17/23 iron level within normal range Folate and B12 within normal range Monitor H&H   Vitamin D  insufficiency:  started vitamin D  50,000 units p.o. weekly, follow with PCP to repeat vitamin D  level after 3 to 6 months.   Constipation,  laxatives    Class 1 obesity based on BMI: Body mass index is 31.31 kg/m.[name] Significantly low or high BMI is associated with higher medical risk.  Underweight - under 18  overweight - 25 to 29 obese - 30 or more Class 1 obesity: BMI of 30.0 to 34 Class 2 obesity: BMI of 35.0 to 39 Class 3 obesity: BMI of 40.0 to 49 Super Morbid Obesity: BMI 50-59 Super-super Morbid Obesity: BMI 60+ Healthy nutrition and physical activity advised as adjunct to other disease management and risk reduction treatments    DVT prophylaxis: heparin  IV fluids: no continuous IV fluids  Nutrition: cardiac Central lines / other devices: colostomy  Code Status: FULL CODE ACP documentation reviewed:  none on file in [facility]  TOC needs: TBD expect will need rehab Medical barriers to dispo: Afib, cardiac. Expected medical readiness for discharge 2-3 days at minimum.              Subjective / Brief ROS:  Patient reports tired this mornign, this afternoon when called in for Afib he reports some chest tightness which improved Back pain persists some Denies CP/SOB.  Pain controlled.  Denies new weakness.  Tolerating diet.  Reports no concerns w/ urination/defecation.   Family Communication: goddaughter at bedside on rounds this afternoon     Objective Findings:  Vitals:   11/08/23 1409 11/08/23 1415 11/08/23 1430 11/08/23 1500  BP:  115/62 (!) 113/57 (!) 111/56  Pulse: (!) 36 74 73 70  Resp: 20 14 16 12   Temp:      TempSrc:      SpO2: 97% 92% 93% 95%  Weight:      Height:        Intake/Output Summary (Last 24 hours) at 11/08/2023 1749 Last data filed at 11/08/2023 0600 Gross per 24 hour  Intake 3 ml  Output 525 ml  Net -522 ml   Filed  Weights   11/06/23 1508 11/07/23 0500 11/08/23 0600  Weight: 90.3 kg 89.9 kg 88 kg    Examination:  Physical Exam Constitutional:      Appearance: He is ill-appearing.  Cardiovascular:     Rate and Rhythm: Tachycardia present. Rhythm irregular.     Comments: Improved to reg rhythm and rate on amio Pulmonary:     Effort: Pulmonary effort is normal. No respiratory distress.  Abdominal:     General: Bowel sounds are normal.  Musculoskeletal:     Right lower leg: No edema.     Left lower leg: No edema.  Skin:    General: Skin is warm.     Coloration: Skin is pale.  Neurological:     General: No focal deficit present.     Mental Status: He is alert and oriented to person, place, and time. Mental status is at baseline.  Psychiatric:        Mood and Affect: Mood normal.  Behavior: Behavior normal.          Scheduled Medications:   aspirin  81 mg Oral Daily   Chlorhexidine  Gluconate Cloth  6 each Topical Daily   feeding supplement (GLUCERNA SHAKE)  237 mL Oral TID BM   heparin  injection (subcutaneous)  5,000 Units Subcutaneous Q8H   insulin  aspart  0-15 Units Subcutaneous TID WC   insulin  aspart  0-5 Units Subcutaneous QHS   insulin  aspart  3 Units Subcutaneous TID WC   insulin  glargine  25 Units Subcutaneous Daily   metoprolol  succinate  25 mg Oral Daily   metoprolol  tartrate       metoprolol  tartrate  5 mg Intravenous Once   multivitamin with minerals  1 tablet Oral Daily   pantoprazole  (PROTONIX ) IV  40 mg Intravenous Q12H   polyethylene glycol  17 g Oral BID   prasugrel  10 mg Oral Daily   rosuvastatin   20 mg Oral Daily   senna  1 tablet Oral BID   sodium chloride  flush  3 mL Intravenous Q12H   tamsulosin   0.4 mg Oral BID   thiamine  100 mg Oral Daily   Vitamin D  (Ergocalciferol )  50,000 Units Oral Q7 days    Continuous Infusions:  amiodarone 60 mg/hr (11/08/23 1326)   Followed by   amiodarone     cefTRIAXone  (ROCEPHIN )  IV 1 g (11/08/23 1511)    promethazine  (PHENERGAN ) injection (IM or IVPB) Stopped (11/04/23 1639)    PRN Medications:  acetaminophen , alum & mag hydroxide-simeth, bisacodyl, HYDROmorphone  (DILAUDID ) injection, metoprolol  tartrate, ondansetron  (ZOFRAN ) IV, mouth rinse, promethazine  (PHENERGAN ) injection (IM or IVPB), sodium chloride  flush, traMADol   Antimicrobials from admission:  Anti-infectives (From admission, onward)    Start     Dose/Rate Route Frequency Ordered Stop   11/07/23 1500  cefTRIAXone  (ROCEPHIN ) 1 g in sodium chloride  0.9 % 100 mL IVPB        1 g 200 mL/hr over 30 Minutes Intravenous Every 24 hours 11/07/23 1410             Data Reviewed:  I have personally reviewed the following...  CBC: Recent Labs  Lab 11/04/23 1316 11/04/23 1726 11/05/23 0317 11/06/23 0441 11/07/23 0525 11/08/23 0454  WBC 8.9  --  8.9 7.8 7.1 4.7  HGB 9.7*  --  10.2* 9.8* 9.0* 9.3*  HCT 29.0*  --  30.8* 29.1* 27.7* 28.3*  MCV 85.5  --  88.5 85.1 87.1 85.8  PLT 193 192 193 180 188 195   Basic Metabolic Panel: Recent Labs  Lab 11/03/23 1021 11/03/23 1445 11/04/23 0506 11/04/23 1316 11/05/23 0317 11/06/23 0441 11/06/23 1442 11/07/23 0525 11/08/23 0454  NA 140   < > 138  --  139 135 132* 134* 135  K 3.2*   < > 3.9  --  3.1* 3.9 4.1 4.1 3.8  CL 100   < > 103  --  102 102 96* 98 98  CO2 26   < > 23  --  23 23 22 24 23   GLUCOSE 152*   < > 290*  --  103* 149* 310* 176* 99  BUN 53*   < > 57*  --  54* 65* 75* 80* 82*  CREATININE 3.02*   < > 3.02*   < > 2.84* 4.10* 4.61* 4.96* 5.20*  CALCIUM  8.7*   < > 7.8*  --  7.9* 7.8* 7.8* 8.1* 8.1*  MG 2.1  --  2.2  --  2.2 2.3  --  2.7*  --  PHOS 2.3*  --  4.6  --  4.3 5.1*  --  5.2*  --    < > = values in this interval not displayed.   GFR: Estimated Creatinine Clearance: 15.9 mL/min (A) (by C-G formula based on SCr of 5.2 mg/dL (H)). Liver Function Tests: Recent Labs  Lab 11/02/23 2315 11/04/23 0506 11/05/23 0317 11/06/23 0441  AST 122* 79* 60* 56*  ALT  40 27 26 31   ALKPHOS 78 70 65 82  BILITOT 1.1 1.3* 0.7 0.5  PROT 6.2* 5.4* 5.6* 5.5*  ALBUMIN 2.6* 2.0* 2.1* 1.9*   Recent Labs  Lab 11/02/23 2115 11/04/23 0506 11/05/23 0317  LIPASE 300* 41 21   No results for input(s): AMMONIA in the last 168 hours. Coagulation Profile: Recent Labs  Lab 11/04/23 1316  INR 1.6*   Cardiac Enzymes: No results for input(s): CKTOTAL, CKMB, CKMBINDEX, TROPONINI in the last 168 hours. BNP (last 3 results) No results for input(s): PROBNP in the last 8760 hours. HbA1C: No results for input(s): HGBA1C in the last 72 hours. CBG: Recent Labs  Lab 11/07/23 2219 11/08/23 0745 11/08/23 1127 11/08/23 1345 11/08/23 1645  GLUCAP 137* 116* 196* 249* 281*   Lipid Profile: No results for input(s): CHOL, HDL, LDLCALC, TRIG, CHOLHDL, LDLDIRECT in the last 72 hours. Thyroid Function Tests: No results for input(s): TSH, T4TOTAL, FREET4, T3FREE, THYROIDAB in the last 72 hours. Anemia Panel: Recent Labs    11/05/23 2258  VITAMINB12 914   Most Recent Urinalysis On File:     Component Value Date/Time   COLORURINE YELLOW (A) 11/07/2023 1229   APPEARANCEUR TURBID (A) 11/07/2023 1229   LABSPEC 1.017 11/07/2023 1229   PHURINE 5.0 11/07/2023 1229   GLUCOSEU 150 (A) 11/07/2023 1229   HGBUR MODERATE (A) 11/07/2023 1229   BILIRUBINUR NEGATIVE 11/07/2023 1229   KETONESUR NEGATIVE 11/07/2023 1229   PROTEINUR >=300 (A) 11/07/2023 1229   NITRITE NEGATIVE 11/07/2023 1229   LEUKOCYTESUR LARGE (A) 11/07/2023 1229   Sepsis Labs: @LABRCNTIP (procalcitonin:4,lacticidven:4) Microbiology: Recent Results (from the past 240 hours)  MRSA Next Gen by PCR, Nasal     Status: Abnormal   Collection Time: 11/03/23  6:15 AM   Specimen: Nasal Mucosa; Nasal Swab  Result Value Ref Range Status   MRSA by PCR Next Gen DETECTED (A) NOT DETECTED Final    Comment: RESULT CALLED TO, READ BACK BY AND VERIFIED WITH: HANNA SPROUT @0732  11/03/23  MJU (NOTE) The GeneXpert MRSA Assay (FDA approved for NASAL specimens only), is one component of a comprehensive MRSA colonization surveillance program. It is not intended to diagnose MRSA infection nor to guide or monitor treatment for MRSA infections. Test performance is not FDA approved in patients less than 72 years old. Performed at [facility], [address], [place], [place] [address]   Urine Culture (for pregnant, neutropenic or urologic patients or patients with an indwelling urinary catheter)     Status: Abnormal (Preliminary result)   Collection Time: 11/07/23 12:29 PM   Specimen: Urine, Clean Catch  Result Value Ref Range Status   Specimen Description   Final    URINE, CLEAN CATCH Performed at [facility], [address], [place], [place] [address]    Special Requests   Final    NONE Performed at [facility], [address], [place], [place] [address]    Culture (A)  Final    >=100,000 COLONIES/mL GRAM POSITIVE COCCI IDENTIFICATION AND SUSCEPTIBILITIES TO FOLLOW Performed at [facility], [address] [place], [place] [address]    Report Status PENDING  Incomplete      Radiology Studies last 3 days: DG Chest Port 1 View Result Date: 11/05/2023 CLINICAL DATA:  Shortness of breath. EXAM: PORTABLE CHEST 1 VIEW COMPARISON:  Lung bases from abdominopelvic CT 11/02/2023 FINDINGS: The heart is enlarged. Small bilateral pleural effusions. Hazy opacity in both lung bases. Opacity abutting the right major fissure may represent a small amount of pleural fluid or airspace disease. Vascular congestion. No pneumothorax. IMPRESSION: 1. Cardiomegaly with vascular congestion and small bilateral pleural effusions. 2. Hazy opacity in both lung bases, favor atelectasis in the setting of pleural effusions. Electronically Signed   By: Andrea Gasman M.D.   On: 11/05/2023 14:06   MR BRAIN WO CONTRAST Result Date: 11/05/2023 EXAM: MRI BRAIN WITHOUT  CONTRAST 11/05/2023 12:21:41 PM TECHNIQUE: Multiplanar multisequence MRI of the head/brain was performed without the administration of intravenous contrast. COMPARISON: CT head without contrast 03/27/2023. CLINICAL HISTORY: Neuro deficit, acute, stroke suspected. Eval for possible cva. FINDINGS: BRAIN AND VENTRICLES: No acute infarct. No intracranial hemorrhage. No mass. No midline shift. No hydrocephalus. The sella is unremarkable. Normal flow voids. ORBITS: Bilateral lens replacements are noted. The globes and orbits are otherwise within normal limits. SINUSES AND MASTOIDS: No acute abnormality. BONES AND SOFT TISSUES: Normal marrow signal. No acute soft tissue abnormality. IMPRESSION: 1. No acute intracranial abnormality related to the clinical history of neuro deficit and suspected stroke. Electronically signed by: Lonni Necessary MD 11/05/2023 12:35 PM EDT RP Workstation: [id number]       Time spent: 35 min   Additional critical care time this afternoon w/ Afib RVR episode   CRITICAL CARE Performed by: Laneta Blunt   Total critical care time: 45 minutes  Critical care time was exclusive of separately billable procedures and treating other patients.  Critical care was necessary to treat or prevent imminent or life-threatening deterioration.  Critical care was time spent personally by me on the following activities: development of treatment plan with patient and/or surrogate as well as nursing, discussions with consultants, evaluation of patient's response to treatment, examination of patient, obtaining history from patient or surrogate, ordering and performing treatments and interventions, ordering and review of laboratory studies, ordering and review of radiographic studies, pulse oximetry and re-evaluation of patient's condition.     Johnthan Axtman, DO [facility] 11/08/2023, 5:49 PM    Dictation software may have been used to generate the above note. Typos may  occur and escape review in typed/dictated notes. Please contact Dr Blunt directly for clarity if needed.  Staff may message me via secure chat in Epic  but this may not receive an immediate response,  please page me for urgent matters!  If 7PM-7AM, please contact night coverage [email or web address]

## 2023-11-08 NOTE — Progress Notes (Signed)
 MD and cardiology have been notified: FYI, the patient had a peak ST elevation of 3.4 at 1545, short run of SVT at 1639, Sinus brady 58 bpm at 1645 per telemetry.    Per hospitalist Rn should notify night shift provider if the patient reports chest pain overnight and perform 12 led EKG.

## 2023-11-08 NOTE — Evaluation (Signed)
 Physical Therapy Evaluation Patient Details Name: Jason Jennings MRN: [id number] DOB: 12/28/1965 Today's Date: 11/08/2023  History of Present Illness  Pt is a 58 y/o M admitted on 11/02/23 after presenting with c/o N&V & LUQ abdominal pain. Pt is being treated for DKA & mild acute pancreatitis. Pt also found to have STEMI, cardiac cath 11/04/23. PMH: rectal CA s/p rectosigmoid resection with colostomy, IDDM2, CKD3B, HTN, BPH, recent hospitalization (08/12/23-08/20/23) with sepsis 2/2 MRSA cellulitis/abscess L foot s/p I&D/antibiotics  Clinical Impression  Pt seen for PT evaluation with co-tx with OT. Pt reports prior to admission he was independent without AD, denies falls, still driving. On this date, pt is able to complete bed mobility without assistance, sit>stand with good awareness of hand placement. Pt tolerates standing ~4 minutes with CGA, stood 2nd time but pt with c/o dizziness, HR up to 223 bpm & pt instructed to sit down, where symptoms resolved & HR returned to 80s bpm. Recommend ongoing PT services to progress mobility as able.        If plan is discharge home, recommend the following: A little help with walking and/or transfers;A little help with bathing/dressing/bathroom;Assistance with cooking/housework;Assist for transportation;Help with stairs or ramp for entrance   Can travel by private vehicle        Equipment Recommendations Other (comment) (TBD)  Recommendations for Other Services       Functional Status Assessment Patient has had a recent decline in their functional status and demonstrates the ability to make significant improvements in function in a reasonable and predictable amount of time.     Precautions / Restrictions Precautions Precautions: Fall Precaution/Restrictions Comments: watch HR Restrictions Weight Bearing Restrictions Per Provider Order: No      Mobility  Bed Mobility Overal bed mobility: Needs Assistance Bed Mobility: Supine to Sit      Supine to sit: Supervision, HOB elevated (exit R side of bed)          Transfers Overall transfer level: Needs assistance Equipment used: Rolling walker (2 wheels) Transfers: Sit to/from Stand, Bed to chair/wheelchair/BSC Sit to Stand: Supervision, Contact guard assist   Step pivot transfers: Contact guard assist (no AD)       General transfer comment: Pt with good awareness to push to standing    Ambulation/Gait                  Stairs            Wheelchair Mobility     Tilt Bed    Modified Rankin (Stroke Patients Only)       Balance Overall balance assessment: Needs assistance Sitting-balance support: No upper extremity supported, Feet supported Sitting balance-Leahy Scale: Fair     Standing balance support: During functional activity, Bilateral upper extremity supported, Reliant on assistive device for balance Standing balance-Leahy Scale: Fair                               Pertinent Vitals/Pain Pain Assessment Pain Assessment: No/denies pain    Home Living Family/patient expects to be discharged to:: Private residence Living Arrangements: Alone Available Help at Discharge: Family;Available PRN/intermittently Type of Home: House Home Access: Stairs to enter Entrance Stairs-Rails: Doctor, general practice of Steps: 4   Home Layout: One level Home Equipment: Agricultural consultant (2 wheels);Rollator (4 wheels);Cane - single point;Wheelchair - manual;Transport chair;Grab bars - tub/shower;Shower seat Additional Comments: tub bar    Prior Function Prior Level of Function :  Independent/Modified Independent             Mobility Comments: independent without AD, driving denies falls in the past 6 months ADLs Comments: independent with cooking, cleaning, bathing, dressing, manages his ostomy without assistance     Extremity/Trunk Assessment   Upper Extremity Assessment Upper Extremity Assessment: Overall WFL for tasks  assessed    Lower Extremity Assessment Lower Extremity Assessment: Generalized weakness    Cervical / Trunk Assessment Cervical / Trunk Assessment: Normal  Communication   Communication Communication: No apparent difficulties    Cognition Arousal: Alert Behavior During Therapy: WFL for tasks assessed/performed   PT - Cognitive impairments: No apparent impairments                         Following commands: Intact       Cueing Cueing Techniques: Verbal cues     General Comments General comments (skin integrity, edema, etc.): HR elevated to 223 bpm upon 2nd standing attempt with pt c/o dizziness, lowered to 80s bpm once sitting, symptoms resolved as well    Exercises     Assessment/Plan    PT Assessment Patient needs continued PT services  PT Problem List Decreased activity tolerance;Decreased balance;Decreased mobility;Decreased strength;Decreased knowledge of use of DME;Decreased knowledge of precautions       PT Treatment Interventions DME instruction;Balance training;Gait training;Neuromuscular re-education;Stair training;Functional mobility training;Therapeutic activities;Therapeutic exercise;Manual techniques;Patient/family education    PT Goals (Current goals can be found in the Care Plan section)  Acute Rehab PT Goals Patient Stated Goal: get better PT Goal Formulation: With patient Time For Goal Achievement: 11/22/23 Potential to Achieve Goals: Good    Frequency Min 3X/week     Co-evaluation PT/OT/SLP Co-Evaluation/Treatment: Yes Reason for Co-Treatment: Complexity of the patient's impairments (multi-system involvement);To address functional/ADL transfers PT goals addressed during session: Mobility/safety with mobility;Balance;Proper use of DME         AM-PAC PT 6 Clicks Mobility  Outcome Measure Help needed turning from your back to your side while in a flat bed without using bedrails?: None Help needed moving from lying on your back  to sitting on the side of a flat bed without using bedrails?: A Little Help needed moving to and from a bed to a chair (including a wheelchair)?: A Little Help needed standing up from a chair using your arms (e.g., wheelchair or bedside chair)?: A Little Help needed to walk in hospital room?: A Lot Help needed climbing 3-5 steps with a railing? : A Lot 6 Click Score: 17    End of Session   Activity Tolerance: Treatment limited secondary to medical complications (Comment) Patient left: in chair;with call bell/phone within reach Nurse Communication: Mobility status (HR) PT Visit Diagnosis: Muscle weakness (generalized) (M62.81);Other abnormalities of gait and mobility (R26.89);Difficulty in walking, not elsewhere classified (R26.2)    Time: 8869-8851 PT Time Calculation (min) (ACUTE ONLY): 18 min   Charges:   PT Evaluation $PT Eval Moderate Complexity: 1 Mod   PT General Charges $$ ACUTE PT VISIT: 1 Visit         Richerd Pinal, PT, DPT 11/08/23, 12:18 PM   [name] 11/08/2023, 12:17 PM

## 2023-11-08 NOTE — Progress Notes (Signed)
   Inpatient Rehab Admissions Coordinator :  Per therapy recommendations patient was screened for CIR candidacy by Heron Leavell RN MSN.  The CIR admissions team will follow and monitor for progress and place a Rehab Consult order if felt to be appropriate. Please contact me with any questions.  Heron Leavell RN MSN Admissions Coordinator [phone]

## 2023-11-08 NOTE — Progress Notes (Addendum)
 1241 responded to heart rate alarm HR 175 patient sitting in chair bp taken 109/70 patient reports feeling dizzy 12 lead obtained dr secure chatted patient placed back on bed HR 140-150s  104/67 patient reports less dizziness with lying down, awaiting orders bedside RN aware unable to be present at this time 1258 second 12 lead obtained now reading afib rvr, charge paging attending [phone] Attending at bedside new orders

## 2023-11-08 NOTE — Hospital Course (Addendum)
 Hospital course / significant events:   HPI: Jason Jennings is a 58 y.o. 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   He presented with 3 days of nausea, nonbloody, nonbilious, non-coffee-ground vomiting and left upper abdominal pain .  Denies fever or chills, cough or chest pain or change in bowel habits.  Emesis is nonbloody and nonbilious.  Has a history of pancreatitis about 12 years prior.  Denies alcohol use.   09/25: to ED. (+)DKA.  09/26: on insulin  gtt 09/27: (+)STEMI -  of note, EKG/troponin not done in ED and suspect late presentation. Taken for cath/PCI w/ stent to LAD. TTE LVEF 20 to 25%, global hypokinesis, mild to moderate TR. DC'd IV fluid, careful diuresis w/ renal fxn. Urinary retention w/ bladder scan >400 ml. Foley catheter was inserted, 675 ml urine was collected 09/28: L hand numbness, MRI brain no CVA.  09/29: creatinine 4.1, elevated, Discontinued IV Lasix today 09/30: sCr 4.96, elevated, consulted nephrology  10/01: into new Afib RVR but converted back to sinus on amio, will leave on amio gtt for now. D/w cardiology - expect some persistent ST elevation given severity of MI, if chest pain recurrence repeat 12-lead EKG and alert cardiology team.  10/02: amio gtt dc d/t bradycardia. Doing better this morning. Planning to start dialysis tomorrow  10/03: giving 1 unit PRBC. HD#1 today 10/04: tired following dialysis     Consultants:  Cardiology  Heart Failure team  Nephrology   Procedures/Surgeries: Cardiac cath w/ PCI  Temporary HD cath insertion       ASSESSMENT & PLAN:   STEMI found to have EKG changes on 9/27, likely late presenting STEMI in setting  of ongoing ACS likely w/ MI since arrival, atypical presentation with abdominal pain and nausea/vomiting.  Findings were masked by diabetic ketoacidosis and slightly elevated lipase. EKG and Trop were not done in the ED on admission. S/p LAD stent DAPT at least 12 months. Aspirin 81 mg p.o. daily, prasugrel 10 mg p.o. daily If/when medical issues and renal function improved, consider proceeding with RCA PCI.   See below re: other cardiac problems, CHF expect some persistent ST elevation given severity of MI, if chest pain recurrence repeat 12-lead EKG and alert cardiology team.  Also expect troponin levels will be high / may not be clinically helpful, defer repeat troponin to cardiology.    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 TTE: LVEF 20 to 25%, global hypokinesis, mild to moderate TR Caution w/ diuresis and GDMT given higher MAP goal w/ renal fxn, renal fxn itself also impacting Rx  Cardiology managing medications    Acute renal failure superimposed on stage 3b chronic kidney disease Dialysis (hopefully temporary) 10/03 started  Nephrology following for dialysis    Diabetic ketoacidosis without coma associated with type 2 diabetes mellitus - DKA resolved w/ tx  Lantus  + NovoLog  sliding scale Monitor CBG, continue diabetic diet  Acute pancreatitis Elevated lipase (question reactive) Intractable nausea and vomiting due to pancreatitis vs DKA vs gastroparesis. Resolved  PPI Po hydration     Pseudhyponatremia d/t hyperglycemia, correct to hyponatremia milder, likely d/t CHF, renal disease  Monitor BMP Correct sodium for glucose as  needed Address hyponatremia based on underlying cause(s) see CHF   Hypokalemia:  Replete cautiously due to renal failure Hypophosphatemia: repleted orally Check electrolytes daily   Intermittent urinary retention 9/27 bladder scan >400 ml Foley catheter was inserted, 675 ml urine was collected Continue Fnow out, urinating spontaneously Flomax  0.4 mg p.o. 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   Vitamin D  insufficiency:  vitamin D  50,000 units p.o. weekly, follow with PCP to repeat vitamin D  level after 3 to 6 months.   Constipation,  Laxatives w/ senna added lactulose     Class 1 obesity based on BMI: Body mass index is 31.31 kg/m.[name] Significantly low or high BMI is associated with higher medical risk.  Underweight - under 18  overweight - 25 to 29 obese - 30 or more Class 1 obesity: BMI of 30.0 to 34 Class 2 obesity: BMI of 35.0 to 39 Class 3 obesity: BMI of 40.0 to 49 Super Morbid Obesity: BMI 50-59 Super-super Morbid Obesity: BMI 60+ Healthy nutrition and physical activity advised as adjunct to other disease management and risk reduction treatments    DVT prophylaxis: heparin  IV fluids: no continuous IV fluids  Nutrition: cardiac Central lines / other devices: colostomy, foley - removing foley today   Code Status: FULL CODE ACP documentation reviewed:  none on file in [facility]  TOC needs: TBD expect will need rehab Medical barriers to dispo: needing dialysis. Expected medical readiness for discharge several more days / pending need for possible permanent dialysis

## 2023-11-08 NOTE — Progress Notes (Signed)
 Advanced Heart Failure Rounding Note  Cardiologist: None  Chief Complaint: Chest pain, acute renal failure Subjective:    Did not get out of bed yesterday, felt that it was too difficult the day before. Did not sleep well overnight either.    Objective:   Weight Range: 88 kg Body mass index is 31.31 kg/m.   Vital Signs:   Temp:  [98.2 F (36.8 C)-98.9 F (37.2 C)] 98.6 F (37 C) (10/01 0200) Pulse Rate:  [72-98] 95 (10/01 0900) Resp:  [10-23] 16 (10/01 0900) BP: (109-143)/(56-115) 128/75 (10/01 0900) SpO2:  [93 %-100 %] 95 % (10/01 0900) Weight:  [88 kg] 88 kg (10/01 0600) Last BM Date : 11/01/23  Weight change: Filed Weights   11/06/23 1508 11/07/23 0500 11/08/23 0600  Weight: 90.3 kg 89.9 kg 88 kg    Intake/Output:   Intake/Output Summary (Last 24 hours) at 11/08/2023 0917 Last data filed at 11/08/2023 0600 Gross per 24 hour  Intake 796.33 ml  Output 695 ml  Net 101.33 ml      Physical Exam    GENERAL: chronically ill appearing PULM:  Mildly increased WOB CARDIAC:  JVP: Mildly elevated         Normal rate with regular rhythm. No murmurs, rubs or gallops.  Trace edema. Warm and well perfused extremities. ABDOMEN: Nontender NEUROLOGIC: Patient is oriented x3 with no focal or lateralizing neurologic deficits.     Medications:     Scheduled Medications:  aspirin  81 mg Oral Daily   Chlorhexidine  Gluconate Cloth  6 each Topical Daily   feeding supplement (GLUCERNA SHAKE)  237 mL Oral TID BM   heparin  injection (subcutaneous)  5,000 Units Subcutaneous Q8H   insulin  aspart  0-15 Units Subcutaneous TID WC   insulin  aspart  0-5 Units Subcutaneous QHS   insulin  aspart  3 Units Subcutaneous TID WC   insulin  glargine  25 Units Subcutaneous Daily   metoprolol  succinate  25 mg Oral Daily   multivitamin with minerals  1 tablet Oral Daily   mupirocin  ointment  1 Application Nasal BID   pantoprazole  (PROTONIX ) IV  40 mg Intravenous Q12H   polyethylene  glycol  17 g Oral BID   prasugrel  10 mg Oral Daily   rosuvastatin   20 mg Oral Daily   senna  1 tablet Oral BID   sodium chloride  flush  3 mL Intravenous Q12H   sorbitol  30 mL Oral Once   tamsulosin   0.4 mg Oral BID   thiamine  100 mg Oral Daily   Vitamin D  (Ergocalciferol )  50,000 Units Oral Q7 days    Infusions:  cefTRIAXone  (ROCEPHIN )  IV Stopped (11/07/23 1540)   promethazine  (PHENERGAN ) injection (IM or IVPB) Stopped (11/04/23 1639)    PRN Medications: acetaminophen , alum & mag hydroxide-simeth, bisacodyl, HYDROmorphone  (DILAUDID ) injection, ondansetron  (ZOFRAN ) IV, mouth rinse, promethazine  (PHENERGAN ) injection (IM or IVPB), sodium chloride  flush, traMADol     Patient Profile   Jason Jennings is a 58 y.o. male with a PMH of colon cancer s/p LAR, CKD stage III who presents with late presenting anterior STEMI complicated by acute renal failure.   Assessment/Plan   Acute systolic heart failure: Appears moderately volume up, unfortunately his creatinine continues to rise. No acute indications for volume removal, but suspect that he may require at least temporary iHD.  - Ischemic, suspect significant residual scar given late presentation - Unable to tolerate significant GDMT given renal disease -Targeting slightly higher MAP goal given acute renal failure, if  MAP consistently above 90 will consider starting low-dose hydralazine/Isordil - Continue metoprolol  succinate 25 mg daily, does not appear low output - Nephrology on board, would be reasonable to give a diuretic challenge today but suspect that he is heading towards iHD   Anterior STEMI: Late presenting, suspect will have significant infarct.  Residual RCA disease. - Will eventually need revascularization, but renal function is currently prohibitive - Will probably have to be staged as an outpatient - Continue aspirin/prasugrel - Continue Crestor  20 mg daily   Acute renal failure: Significant prior renal disease.   Worsening today, though still has dilute urine in his catheter.  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 - Continue to hold nephrotoxic agents   Hypertension: Relatively well-controlled  Deconditioning: Severely deconditioned, he is struggling to get out of bed.  - Needs aggressive mobilization, PT/OT placed - Bowel regimen - Strongly encourage OOB to chair  Medication concerns reviewed with patient and pharmacy team. Barriers identified: Worsening renal function  Length of Stay: 5  Morene JINNY Brownie, MD  11/08/2023, 9:17 AM  Advanced Heart Failure Team Pager [phone] (M-F; 7a - 5p)  Please contact [facility] for night-coverage after hours (5p -7a ) and weekends on [email or web address]

## 2023-11-08 DEATH — deceased

## 2023-11-09 ENCOUNTER — Inpatient Hospital Stay

## 2023-11-09 DIAGNOSIS — E1110 Type 2 diabetes mellitus with ketoacidosis without coma: Secondary | ICD-10-CM | POA: Diagnosis not present

## 2023-11-09 DIAGNOSIS — I4891 Unspecified atrial fibrillation: Secondary | ICD-10-CM | POA: Diagnosis not present

## 2023-11-09 DIAGNOSIS — I5021 Acute systolic (congestive) heart failure: Secondary | ICD-10-CM | POA: Diagnosis not present

## 2023-11-09 LAB — CBC
HCT: 26.2 % — ABNORMAL LOW (ref 39.0–52.0)
Hemoglobin: 8.7 g/dL — ABNORMAL LOW (ref 13.0–17.0)
MCH: 28.2 pg (ref 26.0–34.0)
MCHC: 33.2 g/dL (ref 30.0–36.0)
MCV: 85.1 fL (ref 80.0–100.0)
Platelets: 180 K/uL (ref 150–400)
RBC: 3.08 MIL/uL — ABNORMAL LOW (ref 4.22–5.81)
RDW: 15.4 % (ref 11.5–15.5)
WBC: 5 K/uL (ref 4.0–10.5)
nRBC: 0 % (ref 0.0–0.2)

## 2023-11-09 LAB — MAGNESIUM: Magnesium: 3.1 mg/dL — ABNORMAL HIGH (ref 1.7–2.4)

## 2023-11-09 LAB — URINE CULTURE: Culture: >=100000 — AB

## 2023-11-09 LAB — BASIC METABOLIC PANEL WITH GFR
Anion gap: 14 (ref 5–15)
BUN: 97 mg/dL — ABNORMAL HIGH (ref 6–20)
CO2: 21 mmol/L — ABNORMAL LOW (ref 22–32)
Calcium: 7.8 mg/dL — ABNORMAL LOW (ref 8.9–10.3)
Chloride: 95 mmol/L — ABNORMAL LOW (ref 98–111)
Creatinine, Ser: 5.37 mg/dL — ABNORMAL HIGH (ref 0.61–1.24)
GFR, Estimated: 12 mL/min — ABNORMAL LOW (ref >60)
Glucose, Bld: 227 mg/dL — ABNORMAL HIGH (ref 70–99)
Potassium: 4.1 mmol/L (ref 3.5–5.1)
Sodium: 130 mmol/L — ABNORMAL LOW (ref 135–145)

## 2023-11-09 LAB — GLUCOSE, CAPILLARY
Glucose-Capillary: 225 mg/dL — ABNORMAL HIGH (ref 70–99)
Glucose-Capillary: 256 mg/dL — ABNORMAL HIGH (ref 70–99)
Glucose-Capillary: 199 mg/dL — ABNORMAL HIGH (ref 70–99)
Glucose-Capillary: 120 mg/dL — ABNORMAL HIGH (ref 70–99)

## 2023-11-09 LAB — PROTIME-INR
INR: 1.3 — ABNORMAL HIGH (ref 0.8–1.2)
Prothrombin Time: 17.1 s — ABNORMAL HIGH (ref 11.4–15.2)

## 2023-11-09 LAB — APTT: aPTT: 40 s — ABNORMAL HIGH (ref 24–36)

## 2023-11-09 LAB — LACTIC ACID, PLASMA: Lactic Acid, Venous: 0.9 mmol/L (ref 0.5–1.9)

## 2023-11-09 LAB — HEPATITIS B SURFACE ANTIGEN: Hepatitis B Surface Ag: NONREACTIVE

## 2023-11-09 MED ORDER — INSULIN ASPART 100 UNIT/ML IJ SOLN
5.0000 [IU] | Freq: Three times a day (TID) | INTRAMUSCULAR | Status: DC
  Administered 2023-11-09 – 2023-11-11 (×5): 5 [IU] via SUBCUTANEOUS
  Filled 2023-11-09 (×5): qty 1

## 2023-11-09 MED ORDER — MIDAZOLAM HCL 2 MG/2ML IJ SOLN
2.0000 mg | Freq: Once | INTRAMUSCULAR | Status: AC
  Administered 2023-11-09: 2 mg via INTRAVENOUS

## 2023-11-09 MED ORDER — TRAZODONE HCL 100 MG PO TABS
100.0000 mg | ORAL_TABLET | Freq: Every day | ORAL | Status: DC
  Administered 2023-11-09 – 2023-11-11 (×3): 100 mg via ORAL
  Filled 2023-11-09 (×4): qty 1

## 2023-11-09 MED ORDER — FENTANYL CITRATE PF 50 MCG/ML IJ SOSY
PREFILLED_SYRINGE | INTRAMUSCULAR | Status: AC
  Filled 2023-11-09: qty 1

## 2023-11-09 MED ORDER — FENTANYL CITRATE PF 50 MCG/ML IJ SOSY
12.5000 ug | PREFILLED_SYRINGE | Freq: Once | INTRAMUSCULAR | Status: AC
  Administered 2023-11-09: 12.5 ug via INTRAVENOUS

## 2023-11-09 MED ORDER — LACTULOSE 10 GM/15ML PO SOLN
10.0000 g | Freq: Two times a day (BID) | ORAL | Status: DC | PRN
  Administered 2023-11-09 – 2023-11-11 (×2): 10 g via ORAL
  Filled 2023-11-09 (×2): qty 30

## 2023-11-09 MED ORDER — PANTOPRAZOLE SODIUM 40 MG PO TBEC
40.0000 mg | DELAYED_RELEASE_TABLET | Freq: Two times a day (BID) | ORAL | Status: DC
  Administered 2023-11-09 – 2023-11-11 (×5): 40 mg via ORAL
  Filled 2023-11-09 (×5): qty 1

## 2023-11-09 MED ORDER — SENNA 8.6 MG PO TABS
2.0000 | ORAL_TABLET | Freq: Two times a day (BID) | ORAL | Status: DC
  Administered 2023-11-09 – 2023-11-11 (×4): 17.2 mg via ORAL
  Filled 2023-11-09 (×4): qty 2

## 2023-11-09 MED ORDER — TRAZODONE HCL 50 MG PO TABS: 50.0000 mg | ORAL_TABLET | Freq: Once | ORAL | Status: DC

## 2023-11-09 MED ORDER — AMOXICILLIN 500 MG PO CAPS
500.0000 mg | ORAL_CAPSULE | Freq: Two times a day (BID) | ORAL | Status: DC
  Filled 2023-11-09: qty 1

## 2023-11-09 MED ORDER — PRASUGREL HCL 10 MG PO TABS
10.0000 mg | ORAL_TABLET | Freq: Every day | ORAL | Status: DC
  Administered 2023-11-09 – 2023-11-11 (×3): 10 mg via ORAL
  Filled 2023-11-09 (×4): qty 1

## 2023-11-09 MED ORDER — AMOXICILLIN 500 MG PO CAPS
500.0000 mg | ORAL_CAPSULE | Freq: Two times a day (BID) | ORAL | Status: DC
Start: 2023-11-09 — End: 2023-11-12
  Administered 2023-11-09 – 2023-11-11 (×6): 500 mg via ORAL
  Filled 2023-11-09 (×7): qty 1

## 2023-11-09 MED ORDER — MIDAZOLAM HCL 2 MG/2ML IJ SOLN
INTRAMUSCULAR | Status: AC
  Filled 2023-11-09: qty 2

## 2023-11-09 MED ORDER — MELATONIN 5 MG PO TABS
5.0000 mg | ORAL_TABLET | Freq: Every day | ORAL | Status: DC
  Administered 2023-11-09 – 2023-11-10 (×2): 5 mg via ORAL
  Filled 2023-11-09 (×2): qty 1

## 2023-11-09 NOTE — Plan of Care (Signed)
  Problem: Education: Goal: Ability to describe self-care measures that may prevent or decrease complications (Diabetes Survival Skills Education) will improve Outcome: Progressing   Problem: Coping: Goal: Ability to adjust to condition or change in health will improve Outcome: Progressing   Problem: Nutritional: Goal: Maintenance of adequate nutrition will improve Outcome: Progressing   Problem: Respiratory: Goal: Will regain and/or maintain adequate ventilation Outcome: Progressing   Problem: Pain Managment: Goal: General experience of comfort will improve and/or be controlled Outcome: Progressing   Problem: Skin Integrity: Goal: Risk for impaired skin integrity will decrease Outcome: Progressing

## 2023-11-09 NOTE — Progress Notes (Signed)
 Dr. Dorinda made aware patient's HR dropped to 41 non-sustained. BP stable. Patient states he feels light headed and short of breath. Patient's spO2 noted to drop to 86% while sleeping. Placed on 2L. HR sustaining 54-56. Amio gtt stopped.

## 2023-11-09 NOTE — Procedures (Signed)
 Central Venous Catheter Insertion Procedure Note  Jason Jennings  [id number]  [birth year]  Date:11/09/23  Time:4:00 PM   Provider Performing:Fraser Busche   Procedure: Insertion of Non-tunneled Central Venous Catheter(36556)with US  guidance (23062)    Indication(s) Medication administration and Hemodialysis  Consent Risks of the procedure as well as the alternatives and risks of each were explained to the patient and/or caregiver.  Consent for the procedure was obtained and is signed in the bedside chart  Anesthesia Topical only with 1% lidocaine    Timeout Verified patient identification, verified procedure, site/side was marked, verified correct patient position, special equipment/implants available, medications/allergies/relevant history reviewed, required imaging and test results available.  Sterile Technique Maximal sterile technique including full sterile barrier drape, hand hygiene, sterile gown, sterile gloves, mask, hair covering, sterile ultrasound probe cover (if used).  Procedure Description Area of catheter insertion was cleaned with chlorhexidine  and draped in sterile fashion.   With real-time ultrasound guidance a HD catheter was placed into the left internal jugular vein.  Nonpulsatile blood flow and easy flushing noted in all ports.  The catheter was sutured in place and sterile dressing applied.  Complications/Tolerance None; patient tolerated the procedure well. Chest X-ray is ordered to verify placement for internal jugular or subclavian cannulation.  Chest x-ray is not ordered for femoral cannulation.  EBL Minimal  Specimen(s) None   Robet Kim, PA-C Pulmonary/Critical Care PCCM Team Contact Info: [phone]

## 2023-11-09 NOTE — Progress Notes (Signed)
 PROGRESS NOTE    Jason Jennings   FMW:[id number] DOB: 1965-08-05  DOA: 11/02/2023 Date of Service: 11/09/23 which is hospital day 6  PCP: Salli Amato, MD    Hospital course / significant events:   HPI: Jason Jennings is a 58 y.o. 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   He presented with 3 days of nausea, nonbloody, nonbilious, non-coffee-ground vomiting and left upper abdominal pain .  Denies fever or chills, cough or chest pain or change in bowel habits.  Emesis is nonbloody and nonbilious.  Has a history of pancreatitis about 12 years prior.  Denies alcohol use.   09/25: to ED. (+)DKA.  09/26: on insulin  gtt 09/27: (+)STEMI -  of note, EKG/troponin not done in ED and suspect late presentation. Taken for cath/PCI w/ stent to LAD. TTE LVEF 20 to 25%, global hypokinesis, mild to moderate TR. DC'd IV fluid, careful diuresis w/ renal fxn. Urinary retention w/ bladder scan >400 ml. Foley catheter was inserted, 675 ml urine was collected 09/28: L hand numbness, MRI brain no CVA.  09/29: creatinine 4.1, elevated, Discontinued IV Lasix today 09/30: sCr 4.96, elevated, consulted nephrology  10/01: into new Afib RVR but converted back to sinus on amio, will leave on amio gtt for now. D/w cardiology - expect some persistent ST elevation given severity of MI, if chest pain recurrence repeat 12-lead EKG and alert cardiology team.  10/02: amio gtt dc d/t bradycardia. Doing better this morning. Planning to start dialysis tomorrow     Consultants:  Cardiology  Heart Failure team  Nephrology   Procedures/Surgeries: Cardiac cath w/ PCI       ASSESSMENT & PLAN:   STEMI found  to have EKG changes on 9/27, likely late presenting STEMI in setting of ongoing ACS likely w/ MI since arrival, atypical presentation with abdominal pain and nausea/vomiting.  Findings were masked by diabetic ketoacidosis and slightly elevated lipase. EKG and Trop were not done in the ED on admission. S/p LAD stent DAPT at least 12 months. Aspirin 81 mg p.o. daily, prasugrel 10 mg p.o. daily Once his medical issues and renal function improved, we will consider proceeding with RCA PCI.   See below re: other cardiac problems, CHF. .D/w cardiology  expect some persistent ST elevation given severity of MI, if chest pain recurrence repeat 12-lead EKG and alert cardiology team.  Also expect troponin levels will be high / may not be clinically helpful, defer repeat troponin to cardiology.    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 TTE: LVEF 20 to 25%, global hypokinesis, mild to moderate TR Caution w/ diuresis and GDMT given higher MAP goal w/ renal fxn, renal fxn itself also impacting Rx  Cardiology managing medications    Acute renal failure superimposed on stage 3b chronic kidney disease Likely ATN from volume depletion related to fluid losses from vomiting S/p IV fluid resuscitation, stopped w/ low EF  Nephrology following   Diabetic ketoacidosis without coma associated with type 2 diabetes mellitus - DKA resolved w/ tx  Lantus  + NovoLog  sliding scale Monitor CBG, continue diabetic diet   Acute pancreatitis Elevated lipase (  question reactive) Intractable nausea and vomiting due to pancreatitis vs DKA vs gastroparesis. Resolved  PPI Po hydration     Pseudhyponatremia d/t hyperglycemia 11/09/23 sodium 130 corrects to 132 w/ Glc 227, still mild hyponatremia likely d/t CHF  Monitor BMP Correct sodium for glucose as needed Address hyponatremia based on  underlying cause(s) see CHF   Hypokalemia:  Replete cautiously due to renal failure Hypophosphatemia: repleted orally Check electrolytes daily   Intermittent urinary retention 9/27 bladder scan >400 ml Foley catheter was inserted, 675 ml urine was collected Continue Foley catheter for 1 week Start Flomax  0.4 mg p.o. 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 Monitor BP and titrate medications accordingly   Anemia of chronic disease 04/17/23 iron level within normal range Folate and B12 within normal range Monitor H&H   Vitamin D  insufficiency:  started vitamin D  50,000 units p.o. weekly, follow with PCP to repeat vitamin D  level after 3 to 6 months.   Constipation,  Laxatives w/ senna added lactulose today     Class 1 obesity based on BMI: Body mass index is 31.31 kg/m.[name] Significantly low or high BMI is associated with higher medical risk.  Underweight - under 18  overweight - 25 to 29 obese - 30 or more Class 1 obesity: BMI of 30.0 to 34 Class 2 obesity: BMI of 35.0 to 39 Class 3 obesity: BMI of 40.0 to 49 Super Morbid Obesity: BMI 50-59 Super-super Morbid Obesity: BMI 60+ Healthy nutrition and physical activity advised as adjunct to other disease management and risk reduction treatments    DVT prophylaxis: heparin  IV fluids: no continuous IV fluids  Nutrition: cardiac Central lines / other devices: colostomy, foley - removing foley today   Code Status: FULL CODE ACP documentation reviewed:  none on file in [facility]  TOC needs: TBD expect will need rehab Medical barriers to dispo: needing dialysis. Expected medical readiness for discharge several more days / pending need for  possible permanent dialysis              Subjective / Brief ROS:  Patient reports feeling a bit better this mornign Denies CP/SOB.  Pain controlled.  Denies new weakness.  Tolerating diet.    Family Communication: goddaughter at bedside on rounds this afternoon     Objective Findings:  Vitals:   11/09/23 0900 11/09/23 1000 11/09/23 1100 11/09/23 1200  BP: 115/68 112/68 97/65 (!) 109/50  Pulse: 66 66 65 70  Resp: 10 16 12 15   Temp:    97.9 F (36.6 C)  TempSrc:    Oral  SpO2: 100% 97% 100% 99%  Weight:      Height:        Intake/Output Summary (Last 24 hours) at 11/09/2023 1351 Last data filed at 11/09/2023 0800 Gross per 24 hour  Intake 467.18 ml  Output 400 ml  Net 67.18 ml   Filed Weights   11/07/23 0500 11/08/23 0600 11/09/23 0500  Weight: 89.9 kg 88 kg 86.6 kg    Examination:  Physical Exam Constitutional:      Appearance: He is ill-appearing.  Cardiovascular:     Rate and Rhythm: Normal rate and regular rhythm.  Pulmonary:  Effort: Pulmonary effort is normal. No respiratory distress.  Abdominal:     General: Bowel sounds are normal.  Musculoskeletal:     Right lower leg: No edema.     Left lower leg: No edema.  Skin:    General: Skin is warm.     Coloration: Skin is pale.  Neurological:     General: No focal deficit present.     Mental Status: He is alert and oriented to person, place, and time. Mental status is at baseline.  Psychiatric:        Mood and Affect: Mood normal.        Behavior: Behavior normal.          Scheduled Medications:   amoxicillin   500 mg Oral Q12H   aspirin  81 mg Oral Daily   Chlorhexidine  Gluconate Cloth  6 each Topical Daily   feeding supplement (GLUCERNA SHAKE)  237 mL Oral TID BM   heparin  injection (subcutaneous)  5,000 Units Subcutaneous Q8H   insulin  aspart  0-15 Units Subcutaneous TID WC   insulin  aspart  0-5 Units Subcutaneous QHS   insulin  aspart  3 Units Subcutaneous TID WC   insulin   glargine  25 Units Subcutaneous Daily   melatonin  5 mg Oral QHS   metoprolol  succinate  25 mg Oral Daily   multivitamin with minerals  1 tablet Oral Daily   pantoprazole  (PROTONIX ) IV  40 mg Intravenous Q12H   polyethylene glycol  17 g Oral BID   prasugrel  10 mg Oral Daily   rosuvastatin   20 mg Oral Daily   senna  2 tablet Oral BID   sodium chloride  flush  3 mL Intravenous Q12H   tamsulosin   0.4 mg Oral BID   thiamine  100 mg Oral Daily   Vitamin D  (Ergocalciferol )  50,000 Units Oral Q7 days    Continuous Infusions:  amiodarone Stopped (11/09/23 0204)   promethazine  (PHENERGAN ) injection (IM or IVPB) Stopped (11/04/23 1639)    PRN Medications:  acetaminophen , alum & mag hydroxide-simeth, bisacodyl, HYDROmorphone  (DILAUDID ) injection, lactulose, ondansetron  (ZOFRAN ) IV, mouth rinse, promethazine  (PHENERGAN ) injection (IM or IVPB), sodium chloride  flush, traMADol   Antimicrobials from admission:  Anti-infectives (From admission, onward)    Start     Dose/Rate Route Frequency Ordered Stop   11/09/23 1445  amoxicillin  (AMOXIL ) capsule 500 mg  Status:  Discontinued        500 mg Oral Every 12 hours 11/09/23 1350 11/09/23 1351   11/09/23 1445  amoxicillin  (AMOXIL ) capsule 500 mg        500 mg Oral Every 12 hours 11/09/23 1351 11/14/23 0959   11/07/23 1500  cefTRIAXone  (ROCEPHIN ) 1 g in sodium chloride  0.9 % 100 mL IVPB  Status:  Discontinued        1 g 200 mL/hr over 30 Minutes Intravenous Every 24 hours 11/07/23 1410 11/09/23 1350           Data Reviewed:  I have personally reviewed the following...  CBC: Recent Labs  Lab 11/05/23 0317 11/06/23 0441 11/07/23 0525 11/08/23 0454 11/09/23 0449  WBC 8.9 7.8 7.1 4.7 5.0  HGB 10.2* 9.8* 9.0* 9.3* 8.7*  HCT 30.8* 29.1* 27.7* 28.3* 26.2*  MCV 88.5 85.1 87.1 85.8 85.1  PLT 193 180 188 195 180   Basic Metabolic Panel: Recent Labs  Lab 11/03/23 1021 11/03/23 1445 11/04/23 0506 11/04/23 1316 11/05/23 0317  11/06/23 0441 11/06/23 1442 11/07/23 0525 11/08/23 0454 11/09/23 0449  NA 140   < > 138  --  139 135 132* 134* 135 130*  K 3.2*   < > 3.9  --  3.1* 3.9 4.1 4.1 3.8 4.1  CL 100   < > 103  --  102 102 96* 98 98 95*  CO2 26   < > 23  --  23 23 22 24 23  21*  GLUCOSE 152*   < > 290*  --  103* 149* 310* 176* 99 227*  BUN 53*   < > 57*  --  54* 65* 75* 80* 82* 97*  CREATININE 3.02*   < > 3.02*   < > 2.84* 4.10* 4.61* 4.96* 5.20* 5.37*  CALCIUM  8.7*   < > 7.8*  --  7.9* 7.8* 7.8* 8.1* 8.1* 7.8*  MG 2.1  --  2.2  --  2.2 2.3  --  2.7*  --  3.1*  PHOS 2.3*  --  4.6  --  4.3 5.1*  --  5.2*  --   --    < > = values in this interval not displayed.   GFR: Estimated Creatinine Clearance: 15.3 mL/min (A) (by C-G formula based on SCr of 5.37 mg/dL (H)). Liver Function Tests: Recent Labs  Lab 11/02/23 2315 11/04/23 0506 11/05/23 0317 11/06/23 0441  AST 122* 79* 60* 56*  ALT 40 27 26 31   ALKPHOS 78 70 65 82  BILITOT 1.1 1.3* 0.7 0.5  PROT 6.2* 5.4* 5.6* 5.5*  ALBUMIN 2.6* 2.0* 2.1* 1.9*   Recent Labs  Lab 11/02/23 2115 11/04/23 0506 11/05/23 0317  LIPASE 300* 41 21   No results for input(s): AMMONIA in the last 168 hours. Coagulation Profile: Recent Labs  Lab 11/04/23 1316  INR 1.6*   Cardiac Enzymes: No results for input(s): CKTOTAL, CKMB, CKMBINDEX, TROPONINI in the last 168 hours. BNP (last 3 results) No results for input(s): PROBNP in the last 8760 hours. HbA1C: No results for input(s): HGBA1C in the last 72 hours. CBG: Recent Labs  Lab 11/08/23 1345 11/08/23 1645 11/08/23 2123 11/09/23 0750 11/09/23 1137  GLUCAP 249* 281* 263* 225* 256*   Lipid Profile: No results for input(s): CHOL, HDL, LDLCALC, TRIG, CHOLHDL, LDLDIRECT in the last 72 hours. Thyroid Function Tests: No results for input(s): TSH, T4TOTAL, FREET4, T3FREE, THYROIDAB in the last 72 hours. Anemia Panel: No results for input(s): VITAMINB12, FOLATE, FERRITIN,  TIBC, IRON, RETICCTPCT in the last 72 hours.  Most Recent Urinalysis On File:     Component Value Date/Time   COLORURINE YELLOW (A) 11/07/2023 1229   APPEARANCEUR TURBID (A) 11/07/2023 1229   LABSPEC 1.017 11/07/2023 1229   PHURINE 5.0 11/07/2023 1229   GLUCOSEU 150 (A) 11/07/2023 1229   HGBUR MODERATE (A) 11/07/2023 1229   BILIRUBINUR NEGATIVE 11/07/2023 1229   KETONESUR NEGATIVE 11/07/2023 1229   PROTEINUR >=300 (A) 11/07/2023 1229   NITRITE NEGATIVE 11/07/2023 1229   LEUKOCYTESUR LARGE (A) 11/07/2023 1229   Sepsis Labs: @LABRCNTIP (procalcitonin:4,lacticidven:4) Microbiology: Recent Results (from the past 240 hours)  MRSA Next Gen by PCR, Nasal     Status: Abnormal   Collection Time: 11/03/23  6:15 AM   Specimen: Nasal Mucosa; Nasal Swab  Result Value Ref Range Status   MRSA by PCR Next Gen DETECTED (A) NOT DETECTED Final    Comment: RESULT CALLED TO, READ BACK BY AND VERIFIED WITH: HANNA SPROUT @0732  11/03/23 MJU (NOTE) The GeneXpert MRSA Assay (FDA approved for NASAL specimens only), is one component of a comprehensive MRSA colonization surveillance program. It is not intended to diagnose MRSA infection nor to  guide or monitor treatment for MRSA infections. Test performance is not FDA approved in patients less than 28 years old. Performed at [facility], [address] [place], [place] [address]   Urine Culture (for pregnant, neutropenic or urologic patients or patients with an indwelling urinary catheter)     Status: Abnormal   Collection Time: 11/07/23 12:29 PM   Specimen: Urine, Clean Catch  Result Value Ref Range Status   Specimen Description   Final    URINE, CLEAN CATCH Performed at [facility], [address], [place], [place] [address]    Special Requests   Final    NONE Performed at [facility], [address], [place], [place] [address]    Culture >=100,000 COLONIES/mL ENTEROCOCCUS FAECALIS (A)  Final   Report  Status 11/09/2023 FINAL  Final   Organism ID, Bacteria ENTEROCOCCUS FAECALIS (A)  Final      Susceptibility   Enterococcus faecalis - MIC*    AMPICILLIN  <=2 SENSITIVE Sensitive     NITROFURANTOIN <=16 SENSITIVE Sensitive     VANCOMYCIN  2 SENSITIVE Sensitive     * >=100,000 COLONIES/mL ENTEROCOCCUS FAECALIS      Radiology Studies last 3 days: No results found.      Time spent: 35 min   Additional critical care time this afternoon w/ Afib RVR episode   CRITICAL CARE Performed by: Laneta Blunt   Total critical care time: 45 minutes  Critical care time was exclusive of separately billable procedures and treating other patients.  Critical care was necessary to treat or prevent imminent or life-threatening deterioration.  Critical care was time spent personally by me on the following activities: development of treatment plan with patient and/or surrogate as well as nursing, discussions with consultants, evaluation of patient's response to treatment, examination of patient, obtaining history from patient or surrogate, ordering and performing treatments and interventions, ordering and review of laboratory studies, ordering and review of radiographic studies, pulse oximetry and re-evaluation of patient's condition.     Tamim Skog, DO [facility] 11/09/2023, 1:51 PM    Dictation software may have been used to generate the above note. Typos may occur and escape review in typed/dictated notes. Please contact Dr Blunt directly for clarity if needed.  Staff may message me via secure chat in Epic  but this may not receive an immediate response,  please page me for urgent matters!  If 7PM-7AM, please contact night coverage [email or web address]

## 2023-11-09 NOTE — Plan of Care (Signed)
  Problem: Metabolic: Goal: Ability to maintain appropriate glucose levels will improve Outcome: Progressing   Problem: Education: Goal: Ability to describe self-care measures that may prevent or decrease complications (Diabetes Survival Skills Education) will improve Outcome: Progressing   Problem: Cardiac: Goal: Ability to maintain an adequate cardiac output will improve Outcome: Progressing   Problem: Respiratory: Goal: Will regain and/or maintain adequate ventilation Outcome: Progressing   Problem: Clinical Measurements: Goal: Cardiovascular complication will be avoided Outcome: Progressing

## 2023-11-09 NOTE — Progress Notes (Signed)
 Physical Therapy Treatment Patient Details Name: Jason Jennings MRN: [id number] DOB: 12/21/1965 Today's Date: 11/09/2023   History of Present Illness Pt is a 57 y/o M admitted on 11/02/23 after presenting with c/o N&V & LUQ abdominal pain. Pt is being treated for DKA & mild acute pancreatitis. Pt also found to have STEMI, cardiac cath 11/04/23. PMH: rectal CA s/p rectosigmoid resection with colostomy, IDDM2, CKD3B, HTN, BPH, recent hospitalization (08/12/23-08/20/23) with sepsis 2/2 MRSA cellulitis/abscess L foot s/p I&D/antibiotics    PT Comments  Pt seen for PT tx with pt agreeable, noting fatigue & fluctuation in HR since therapy session yesterday, but eager to participate. Daughter present for session. Pt requires min assist for supine>sit but CGA for sit>stand. Pt is able to ambulate in room with RW & CGA but does note fatigue after short distance. Pt also anticipating dialysis catheter placement soon so requesting to rest at this time. Will continue to follow pt acutely to progress mobility as able. Continue to recommend post acute rehab >3 hours therapy/day upon d/c.   If plan is discharge home, recommend the following: A little help with walking and/or transfers;A little help with bathing/dressing/bathroom;Assistance with cooking/housework;Assist for transportation;Help with stairs or ramp for entrance   Can travel by private vehicle        Equipment Recommendations  Rolling walker (2 wheels)    Recommendations for Other Services       Precautions / Restrictions Precautions Precautions: Fall Precaution/Restrictions Comments: watch HR Restrictions Weight Bearing Restrictions Per Provider Order: No     Mobility  Bed Mobility Overal bed mobility: Needs Assistance Bed Mobility: Supine to Sit     Supine to sit: Min assist, HOB elevated, Used rails (exit R side of bed)          Transfers Overall transfer level: Needs assistance Equipment used: Rolling walker (2  wheels) Transfers: Sit to/from Stand Sit to Stand: Contact guard assist                Ambulation/Gait Ambulation/Gait assistance: Contact guard assist Gait Distance (Feet): 10 Feet Assistive device: Rolling walker (2 wheels) Gait Pattern/deviations: Decreased step length - right, Decreased step length - left, Decreased stride length Gait velocity: decreased         Stairs             Wheelchair Mobility     Tilt Bed    Modified Rankin (Stroke Patients Only)       Balance Overall balance assessment: Needs assistance   Sitting balance-Leahy Scale: Fair     Standing balance support: During functional activity, Bilateral upper extremity supported, Reliant on assistive device for balance Standing balance-Leahy Scale: Fair                              Hotel manager: No apparent difficulties  Cognition Arousal: Alert Behavior During Therapy: WFL for tasks assessed/performed   PT - Cognitive impairments: No apparent impairments                                Cueing Cueing Techniques: Verbal cues  Exercises      General Comments General comments (skin integrity, edema, etc.): HR 60 bpm - 87 bpm during session, no c/o symptoms      Pertinent Vitals/Pain Pain Assessment Pain Assessment: No/denies pain    Home Living  Prior Function            PT Goals (current goals can now be found in the care plan section) Acute Rehab PT Goals Patient Stated Goal: get better PT Goal Formulation: With patient Time For Goal Achievement: 11/22/23 Potential to Achieve Goals: Good Progress towards PT goals: Progressing toward goals    Frequency    Min 3X/week      PT Plan      Co-evaluation              AM-PAC PT 6 Clicks Mobility   Outcome Measure  Help needed turning from your back to your side while in a flat bed without using bedrails?: None Help  needed moving from lying on your back to sitting on the side of a flat bed without using bedrails?: A Little Help needed moving to and from a bed to a chair (including a wheelchair)?: A Little Help needed standing up from a chair using your arms (e.g., wheelchair or bedside chair)?: A Little Help needed to walk in hospital room?: A Little Help needed climbing 3-5 steps with a railing? : A Lot 6 Click Score: 18    End of Session   Activity Tolerance: Patient limited by fatigue Patient left: in chair;with family/visitor present;with call bell/phone within reach Nurse Communication: Mobility status PT Visit Diagnosis: Muscle weakness (generalized) (M62.81);Other abnormalities of gait and mobility (R26.89);Difficulty in walking, not elsewhere classified (R26.2)     Time: 8571-8555 PT Time Calculation (min) (ACUTE ONLY): 16 min  Charges:    $Therapeutic Activity: 8-22 mins PT General Charges $$ ACUTE PT VISIT: 1 Visit                     Jason Jennings, PT, DPT 11/09/23, 4:23 PM    Jason Jennings 11/09/2023, 4:21 PM

## 2023-11-09 NOTE — Progress Notes (Signed)
 Advanced Heart Failure Rounding Note  Cardiologist: None  Chief Complaint: Chest pain, acute renal failure Subjective:    Episode of afib yesterday with standing, accompanied by some chest pressure and pain.  Resolved after starting IV amiodarone.  IV amiodarone had to be held due to bradycardia overnight.  Still extremely weak, has not had a bowel movement, has not really gotten out of bed.   Objective:   Weight Range: 86.6 kg Body mass index is 30.81 kg/m.   Vital Signs:   Temp:  [97.9 F (36.6 C)-98.5 F (36.9 C)] 97.9 F (36.6 C) (10/02 1200) Pulse Rate:  [57-73] 67 (10/02 1405) Resp:  [10-20] 14 (10/02 1405) BP: (97-132)/(37-74) 109/50 (10/02 1200) SpO2:  [93 %-100 %] 99 % (10/02 1405) Weight:  [86.6 kg] 86.6 kg (10/02 0500) Last BM Date : 11/01/23  Weight change: Filed Weights   11/07/23 0500 11/08/23 0600 11/09/23 0500  Weight: 89.9 kg 88 kg 86.6 kg    Intake/Output:   Intake/Output Summary (Last 24 hours) at 11/09/2023 1418 Last data filed at 11/09/2023 0800 Gross per 24 hour  Intake 445.91 ml  Output 400 ml  Net 45.91 ml      Physical Exam    GENERAL: Chronically ill appearing PULM: Normal work of breathing lying flat CARDIAC:  JVP: Flat         Normal rate with regular rhythm. No murmurs, rubs or gallops.  Trace edema. Warm and well perfused extremities. ABDOMEN: Nontender NEUROLOGIC: Patient is oriented x3 with no focal or lateralizing neurologic deficits.     Medications:     Scheduled Medications:  amoxicillin   500 mg Oral Q12H   aspirin  81 mg Oral Daily   Chlorhexidine  Gluconate Cloth  6 each Topical Daily   feeding supplement (GLUCERNA SHAKE)  237 mL Oral TID BM   heparin  injection (subcutaneous)  5,000 Units Subcutaneous Q8H   insulin  aspart  0-15 Units Subcutaneous TID WC   insulin  aspart  0-5 Units Subcutaneous QHS   insulin  aspart  3 Units Subcutaneous TID WC   insulin  glargine  25 Units Subcutaneous Daily   melatonin  5 mg  Oral QHS   metoprolol  succinate  25 mg Oral Daily   multivitamin with minerals  1 tablet Oral Daily   pantoprazole   40 mg Oral BID   polyethylene glycol  17 g Oral BID   prasugrel  10 mg Oral Daily   rosuvastatin   20 mg Oral Daily   senna  2 tablet Oral BID   sodium chloride  flush  3 mL Intravenous Q12H   tamsulosin   0.4 mg Oral BID   thiamine  100 mg Oral Daily   Vitamin D  (Ergocalciferol )  50,000 Units Oral Q7 days    Infusions:  amiodarone Stopped (11/09/23 0204)   promethazine  (PHENERGAN ) injection (IM or IVPB) Stopped (11/04/23 1639)    PRN Medications: acetaminophen , alum & mag hydroxide-simeth, bisacodyl, HYDROmorphone  (DILAUDID ) injection, lactulose, ondansetron  (ZOFRAN ) IV, mouth rinse, promethazine  (PHENERGAN ) injection (IM or IVPB), sodium chloride  flush, traMADol     Patient Profile   Jason Jennings is a 58 y.o. male with a PMH of colon cancer s/p LAR, CKD stage III who presents with late presenting anterior STEMI complicated by acute renal failure.   Assessment/Plan   Acute systolic heart failure: Volume status largely stable, mildly hypervolemic. Heading towards iHD.  - Ischemic, suspect significant residual scar given late presentation - Unable to tolerate significant GDMT given renal disease -Targeting slightly higher MAP goal given acute  renal failure, if MAP consistently above 90 will consider starting low-dose hydralazine/Isordil - Continue metoprolol  succinate 25 mg daily, does not appear low output   Anterior STEMI: Late presenting, suspect will have significant infarct.  Residual RCA disease. - Will eventually need revascularization, but renal function is currently prohibitive - Will probably have to be staged as an outpatient if at all - Continue aspirin/prasugrel - Continue Crestor  20 mg daily   Acute renal failure: Significant prior renal disease.  Continues to worsen. Nephrology following. - Continue to hold nephrotoxic agents  Atrial  fibrillation: Afib with RVR 10/1. Resolved with amiodarone, less than 12 hour duration. Given short duration, critical illness, will hold on OAC at this time. If recurrent will need transition to OAC and antiplatelet adjustment. - Continue BB   Hypertension: Relatively well-controlled  Deconditioning: Severely deconditioned, he is struggling to get out of bed.  - Needs aggressive mobilization, PT/OT placed - Bowel regimen - Strongly encourage OOB to chair  Medication concerns reviewed with patient and pharmacy team. Barriers identified: Worsening renal function  Length of Stay: 6  Morene JINNY Brownie, MD  11/09/2023, 2:18 PM  Advanced Heart Failure Team Pager [phone] (M-F; 7a - 5p)  Please contact [facility] for night-coverage after hours (5p -7a ) and weekends on [email or web address]

## 2023-11-09 NOTE — Inpatient Diabetes Management (Signed)
 Inpatient Diabetes Program Recommendations  AACE/ADA: New Consensus Statement on Inpatient Glycemic Control (2015)  Target Ranges:  Prepandial:   less than 140 mg/dL      Peak postprandial:   less than 180 mg/dL (1-2 hours)      Critically ill patients:  140 - 180 mg/dL   Lab Results  Component Value Date   GLUCAP 256 (H) 11/09/2023   HGBA1C 11.0 (H) 08/12/2023    Review of Glycemic Control  Diabetes history: DM2 Outpatient Diabetes medications: Lantus  14 units daily, Humalog  75/25 30 units BID, FreeStyle Libre 3 CGM Current orders for Inpatient glycemic control:  Lantus  25 units daily, Novolog  3 units tid meal coverage, Novolog  0-15 units TID with meals, Novolog  0-5 units QHS   Inpatient Diabetes Program Recommendations:   Postprandial CBGs elevated. Please consider: - Increase Novolog  to 5 units TID with meals for meal coverage if patient eats at least 50% of meals.  Thank you, Myrlene Riera E. Cassandria Drew, RN, MSN, CNS, CDCES  Diabetes Coordinator Inpatient Glycemic Control Team Team Pager [phone] (8am-5pm) 11/09/2023 12:48 PM

## 2023-11-09 NOTE — Progress Notes (Addendum)
 Pt off the unit for first HD treatment. Report given to [facility].

## 2023-11-09 NOTE — Progress Notes (Signed)
 [facility]  ROUNDING NOTE   Subjective:   Patient seen resting in bed Alert and oriented Room air Tolerating meals  Creatinine 5.37  Objective:  Vital signs in last 24 hours:  Temp:  [97.9 F (36.6 C)-98.5 F (36.9 C)] 97.9 F (36.6 C) (10/02 1200) Pulse Rate:  [36-152] 70 (10/02 1200) Resp:  [10-29] 15 (10/02 1200) BP: (89-132)/(37-94) 109/50 (10/02 1200) SpO2:  [92 %-100 %] 99 % (10/02 1200) Weight:  [86.6 kg] 86.6 kg (10/02 0500)  Weight change: -1.4 kg Filed Weights   11/07/23 0500 11/08/23 0600 11/09/23 0500  Weight: 89.9 kg 88 kg 86.6 kg    Intake/Output: I/O last 3 completed shifts: In: 470.2 [I.V.:324.6; IV Piggyback:145.6] Out: 775 [Urine:775]   Intake/Output this shift:  Total I/O In: -  Out: 150 [Urine:150]  Physical Exam: General: NAD  Head: Normocephalic, atraumatic. Moist oral mucosal membranes  Eyes: Anicteric  Lungs:  Clear to auscultation, normal effort  Heart: Regular rate and rhythm  Abdomen:  Soft, nontender  Extremities:  No peripheral edema.  Neurologic: Awake, alert, conversant  Skin: Warm,dry, no rash       Basic Metabolic Panel: Recent Labs  Lab 11/03/23 1021 11/03/23 1445 11/04/23 0506 11/04/23 1316 11/05/23 0317 11/06/23 0441 11/06/23 1442 11/07/23 0525 11/08/23 0454 11/09/23 0449  NA 140   < > 138  --  139 135 132* 134* 135 130*  K 3.2*   < > 3.9  --  3.1* 3.9 4.1 4.1 3.8 4.1  CL 100   < > 103  --  102 102 96* 98 98 95*  CO2 26   < > 23  --  23 23 22 24 23  21*  GLUCOSE 152*   < > 290*  --  103* 149* 310* 176* 99 227*  BUN 53*   < > 57*  --  54* 65* 75* 80* 82* 97*  CREATININE 3.02*   < > 3.02*   < > 2.84* 4.10* 4.61* 4.96* 5.20* 5.37*  CALCIUM  8.7*   < > 7.8*  --  7.9* 7.8* 7.8* 8.1* 8.1* 7.8*  MG 2.1  --  2.2  --  2.2 2.3  --  2.7*  --  3.1*  PHOS 2.3*  --  4.6  --  4.3 5.1*  --  5.2*  --   --    < > = values in this interval not displayed.    Liver Function Tests: Recent Labs  Lab  11/02/23 2315 11/04/23 0506 11/05/23 0317 11/06/23 0441  AST 122* 79* 60* 56*  ALT 40 27 26 31   ALKPHOS 78 70 65 82  BILITOT 1.1 1.3* 0.7 0.5  PROT 6.2* 5.4* 5.6* 5.5*  ALBUMIN 2.6* 2.0* 2.1* 1.9*   Recent Labs  Lab 11/02/23 2115 11/04/23 0506 11/05/23 0317  LIPASE 300* 41 21   No results for input(s): AMMONIA in the last 168 hours.  CBC: Recent Labs  Lab 11/05/23 0317 11/06/23 0441 11/07/23 0525 11/08/23 0454 11/09/23 0449  WBC 8.9 7.8 7.1 4.7 5.0  HGB 10.2* 9.8* 9.0* 9.3* 8.7*  HCT 30.8* 29.1* 27.7* 28.3* 26.2*  MCV 88.5 85.1 87.1 85.8 85.1  PLT 193 180 188 195 180    Cardiac Enzymes: No results for input(s): CKTOTAL, CKMB, CKMBINDEX, TROPONINI in the last 168 hours.  BNP: Invalid input(s): POCBNP  CBG: Recent Labs  Lab 11/08/23 1345 11/08/23 1645 11/08/23 2123 11/09/23 0750 11/09/23 1137  GLUCAP 249* 281* 263* 225* 256*    Microbiology: Results  for orders placed or performed during the hospital encounter of 11/02/23  MRSA Next Gen by PCR, Nasal     Status: Abnormal   Collection Time: 11/03/23  6:15 AM   Specimen: Nasal Mucosa; Nasal Swab  Result Value Ref Range Status   MRSA by PCR Next Gen DETECTED (A) NOT DETECTED Final    Comment: RESULT CALLED TO, READ BACK BY AND VERIFIED WITH: HANNA SPROUT @0732  11/03/23 MJU (NOTE) The GeneXpert MRSA Assay (FDA approved for NASAL specimens only), is one component of a comprehensive MRSA colonization surveillance program. It is not intended to diagnose MRSA infection nor to guide or monitor treatment for MRSA infections. Test performance is not FDA approved in patients less than 85 years old. Performed at [facility], [address], [place], [place] [address]   Urine Culture (for pregnant, neutropenic or urologic patients or patients with an indwelling urinary catheter)     Status: Abnormal   Collection Time: 11/07/23 12:29 PM   Specimen: Urine, Clean Catch  Result Value Ref  Range Status   Specimen Description   Final    URINE, CLEAN CATCH Performed at [facility], [address], [place], [place] [address]    Special Requests   Final    NONE Performed at [facility], [address] [place], [place] [address]    Culture >=100,000 COLONIES/mL ENTEROCOCCUS FAECALIS (A)  Final   Report Status 11/09/2023 FINAL  Final   Organism ID, Bacteria ENTEROCOCCUS FAECALIS (A)  Final      Susceptibility   Enterococcus faecalis - MIC*    AMPICILLIN  <=2 SENSITIVE Sensitive     NITROFURANTOIN <=16 SENSITIVE Sensitive     VANCOMYCIN  2 SENSITIVE Sensitive     * >=100,000 COLONIES/mL ENTEROCOCCUS FAECALIS    Coagulation Studies: No results for input(s): LABPROT, INR in the last 72 hours.  Urinalysis: Recent Labs    11/07/23 1229  COLORURINE YELLOW*  LABSPEC 1.017  PHURINE 5.0  GLUCOSEU 150*  HGBUR MODERATE*  BILIRUBINUR NEGATIVE  KETONESUR NEGATIVE  PROTEINUR >=300*  NITRITE NEGATIVE  LEUKOCYTESUR LARGE*      Imaging: No results found.   Medications:    amiodarone Stopped (11/09/23 9795)   cefTRIAXone  (ROCEPHIN )  IV Stopped (11/08/23 1541)   promethazine  (PHENERGAN ) injection (IM or IVPB) Stopped (11/04/23 1639)    aspirin  81 mg Oral Daily   Chlorhexidine  Gluconate Cloth  6 each Topical Daily   feeding supplement (GLUCERNA SHAKE)  237 mL Oral TID BM   heparin  injection (subcutaneous)  5,000 Units Subcutaneous Q8H   insulin  aspart  0-15 Units Subcutaneous TID WC   insulin  aspart  0-5 Units Subcutaneous QHS   insulin  aspart  3 Units Subcutaneous TID WC   insulin  glargine  25 Units Subcutaneous Daily   melatonin  5 mg Oral QHS   metoprolol  succinate  25 mg Oral Daily   multivitamin with minerals  1 tablet Oral Daily   pantoprazole  (PROTONIX ) IV  40 mg Intravenous Q12H   polyethylene glycol  17 g Oral BID   prasugrel  10 mg Oral Daily   rosuvastatin   20 mg Oral Daily   senna  1 tablet Oral BID   sodium chloride  flush   3 mL Intravenous Q12H   tamsulosin   0.4 mg Oral BID   thiamine  100 mg Oral Daily   Vitamin D  (Ergocalciferol )  50,000 Units Oral Q7 days   acetaminophen , alum & mag hydroxide-simeth, bisacodyl, HYDROmorphone  (DILAUDID ) injection, ondansetron  (ZOFRAN ) IV, mouth rinse,  promethazine  (PHENERGAN ) injection (IM or IVPB), sodium chloride  flush, traMADol   Assessment/ Plan:  [name] is a 58 y.o.  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  Baseline creatinine appears to be 2.85 with GFR 25 on 08/20/2023.  Acute kidney injury likely multifactorial from IV contrast exposure, dehydration, and diuresis.  Creatinine 3.29 on ED arrival.  Diuretics have been held.  IV contrast exposure on 9/27.   Creatinine increased again today. Patient voices his concerns for delayed treatment and states he will be agreeable to dialysis. Requested critical care place HD temp cath. Will perform initial dialysis treatment later today and tomorrow.   Lab Results  Component Value Date   CREATININE 5.37 (H) 11/09/2023   CREATININE 5.20 (H) 11/08/2023   CREATININE 4.96 (H) 11/07/2023    Intake/Output Summary (Last 24 hours) at 11/09/2023 1233 Last data filed at 11/09/2023 0800 Gross per 24 hour  Intake 467.18 ml  Output 400 ml  Net 67.18 ml   2.  Hypokalemia/hyponatremia.  Likely secondary to kidney injury.  Continue to supplement as needed.  Sodium 130 today.    3.  Hypertension with chronic kidney disease.  Currently receiving with metoprolol  only.  Blood pressure 115/68   4. Diabetes mellitus type II with chronic kidney disease/renal manifestations: insulin  dependent. Home regimen includes Humalog  75/25 pin. Most recent  hemoglobin A1c is 11.0 on 08/12/2023.   Glucose slightly elevated.     LOS: 6 Jason Jennings 10/2/202512:33 PM

## 2023-11-10 DIAGNOSIS — E1110 Type 2 diabetes mellitus with ketoacidosis without coma: Secondary | ICD-10-CM | POA: Diagnosis not present

## 2023-11-10 DIAGNOSIS — I5021 Acute systolic (congestive) heart failure: Secondary | ICD-10-CM | POA: Diagnosis not present

## 2023-11-10 DIAGNOSIS — I2109 ST elevation (STEMI) myocardial infarction involving other coronary artery of anterior wall: Secondary | ICD-10-CM | POA: Diagnosis not present

## 2023-11-10 LAB — PREPARE RBC (CROSSMATCH)

## 2023-11-10 LAB — BASIC METABOLIC PANEL WITH GFR
Anion gap: 11 (ref 5–15)
BUN: 70 mg/dL — ABNORMAL HIGH (ref 6–20)
CO2: 26 mmol/L (ref 22–32)
Calcium: 7.9 mg/dL — ABNORMAL LOW (ref 8.9–10.3)
Chloride: 98 mmol/L (ref 98–111)
Creatinine, Ser: 4.53 mg/dL — ABNORMAL HIGH (ref 0.61–1.24)
GFR, Estimated: 14 mL/min — ABNORMAL LOW (ref >60)
Glucose, Bld: 167 mg/dL — ABNORMAL HIGH (ref 70–99)
Potassium: 4 mmol/L (ref 3.5–5.1)
Sodium: 135 mmol/L (ref 135–145)

## 2023-11-10 LAB — CBC
HCT: 22.8 % — ABNORMAL LOW (ref 39.0–52.0)
Hemoglobin: 7.8 g/dL — ABNORMAL LOW (ref 13.0–17.0)
MCH: 28.9 pg (ref 26.0–34.0)
MCHC: 34.2 g/dL (ref 30.0–36.0)
MCV: 84.4 fL (ref 80.0–100.0)
Platelets: 190 K/uL (ref 150–400)
RBC: 2.7 MIL/uL — ABNORMAL LOW (ref 4.22–5.81)
RDW: 15.8 % — ABNORMAL HIGH (ref 11.5–15.5)
WBC: 4.9 K/uL (ref 4.0–10.5)
nRBC: 0 % (ref 0.0–0.2)

## 2023-11-10 LAB — GLUCOSE, CAPILLARY
Glucose-Capillary: 178 mg/dL — ABNORMAL HIGH (ref 70–99)
Glucose-Capillary: 124 mg/dL — ABNORMAL HIGH (ref 70–99)
Glucose-Capillary: 178 mg/dL — ABNORMAL HIGH (ref 70–99)
Glucose-Capillary: 220 mg/dL — ABNORMAL HIGH (ref 70–99)

## 2023-11-10 LAB — ABO/RH: ABO/RH(D): A POS

## 2023-11-10 LAB — HEPATITIS B SURFACE ANTIBODY, QUANTITATIVE: Hep B S AB Quant (Post): <3.5 m[IU]/mL — ABNORMAL LOW

## 2023-11-10 LAB — HEMOGLOBIN AND HEMATOCRIT, BLOOD
HCT: 28.5 % — ABNORMAL LOW (ref 39.0–52.0)
Hemoglobin: 9.7 g/dL — ABNORMAL LOW (ref 13.0–17.0)

## 2023-11-10 MED ORDER — SODIUM CHLORIDE 0.9% IV SOLUTION: Freq: Once | INTRAVENOUS | Status: AC

## 2023-11-10 MED ORDER — HEPARIN SODIUM (PORCINE) 1000 UNIT/ML IJ SOLN
INTRAMUSCULAR | Status: AC
  Filled 2023-11-10: qty 3

## 2023-11-10 MED ORDER — RENA-VITE PO TABS
1.0000 | ORAL_TABLET | Freq: Every day | ORAL | Status: DC
  Administered 2023-11-10 – 2023-11-11 (×2): 1 via ORAL
  Filled 2023-11-10 (×2): qty 1

## 2023-11-10 NOTE — Progress Notes (Signed)
 Occupational Therapy Treatment Patient Details Name: Jason Jennings MRN: [id number] DOB: 12-Nov-1965 Today's Date: 11/10/2023   History of present illness Pt is a 58 y/o M admitted on 11/02/23 after presenting with c/o N&V & LUQ abdominal pain. Pt is being treated for DKA & mild acute pancreatitis. Pt also found to have STEMI, cardiac cath 11/04/23. PMH: rectal CA s/p rectosigmoid resection with colostomy, IDDM2, CKD3B, HTN, BPH, recent hospitalization (08/12/23-08/20/23) with sepsis 2/2 MRSA cellulitis/abscess L foot s/p I&D/antibiotics   OT comments  Jason Jennings was seen for OT treatment on this date. Upon arrival to room pt supine in bed, agreeable to OT Tx session. OT facilitated ADL management with education and assistance as described below. See ADL section for additional details regarding occupational performance. Pt continues to be functionally limited by decreased activity tolerance, limited balance, and generalized weakness. Pt return verbalizes understanding of education provided t/o session. Pt is progressing toward OT goals and continues to benefit from skilled OT services to maximize return to PLOF and minimize risk of future falls, injury, and readmission. Will continue to follow POC as written. Discharge recommendation remains appropriate.         If plan is discharge home, recommend the following:  A little help with walking and/or transfers;A little help with bathing/dressing/bathroom   Equipment Recommendations  None recommended by OT    Recommendations for Other Services      Precautions / Restrictions Precautions Precautions: Fall Precaution/Restrictions Comments: watch HR Restrictions Weight Bearing Restrictions Per Provider Order: No       Mobility Bed Mobility Overal bed mobility: Needs Assistance Bed Mobility: Supine to Sit     Supine to sit: Used rails, Supervision     General bed mobility comments: Increased time/effort to perform    Transfers Overall  transfer level: Needs assistance Equipment used: Rolling walker (2 wheels) Transfers: Sit to/from Stand Sit to Stand: Contact guard assist           General transfer comment: CGA during amb in room.     Balance Overall balance assessment: Needs assistance Sitting-balance support: No upper extremity supported, Feet supported Sitting balance-Leahy Scale: Fair     Standing balance support: During functional activity, Bilateral upper extremity supported, Reliant on assistive device for balance Standing balance-Leahy Scale: Fair Standing balance comment: fatigues quickly                           ADL either performed or assessed with clinical judgement   ADL Overall ADL's : Needs assistance/impaired Eating/Feeding: Supervision/ safety;Sitting;Set up                       Toilet Transfer: Contact guard assist;Rolling walker (2 wheels);Ambulation Toilet Transfer Details (indicate cue type and reason): simulated to bedside chair         Functional mobility during ADLs: Contact guard assist;Rolling walker (2 wheels) General ADL Comments: Pt motivated to participate in OT tx session. Supervision for bed mobility using bed rails with HOB elevated. Pt requests to walk a little able to take a few effortful steps to foot of bed using RW for support with CGA for safety and mgt of lines/leads. Amb ~10 feet total during session.  Educated on safe transfer technique, and falls prevention strategies. Continue to anticipate MOD A for LB ADL management from STS 2/2 limited activity tolerance and LB access.    Extremity/Trunk Assessment  Vision Patient Visual Report: No change from baseline     Perception     Praxis     Communication Communication Communication: No apparent difficulties   Cognition Arousal: Alert Behavior During Therapy: WFL for tasks assessed/performed Cognition: No apparent impairments                                Following commands: Intact        Cueing   Cueing Techniques: Verbal cues  Exercises Other Exercises Other Exercises: OT facilitated ADL management with education and assist as described above.    Shoulder Instructions       General Comments Vss no adverse s/s during session.    Pertinent Vitals/ Pain       Pain Assessment Pain Assessment: No/denies pain  Home Living                                          Prior Functioning/Environment              Frequency  Min 3X/week        Progress Toward Goals  OT Goals(current goals can now be found in the care plan section)  Progress towards OT goals: Progressing toward goals  Acute Rehab OT Goals Patient Stated Goal: improve function OT Goal Formulation: With patient Time For Goal Achievement: 11/22/23 Potential to Achieve Goals: Good  Plan      Co-evaluation                 AM-PAC OT 6 Clicks Daily Activity     Outcome Measure   Help from another person eating meals?: None Help from another person taking care of personal grooming?: A Little Help from another person toileting, which includes using toliet, bedpan, or urinal?: A Little Help from another person bathing (including washing, rinsing, drying)?: A Little Help from another person to put on and taking off regular upper body clothing?: A Little Help from another person to put on and taking off regular lower body clothing?: A Lot 6 Click Score: 18    End of Session Equipment Utilized During Treatment: Rolling walker (2 wheels)  OT Visit Diagnosis: Other abnormalities of gait and mobility (R26.89)   Activity Tolerance Patient tolerated treatment well   Patient Left in chair;with call bell/phone within reach   Nurse Communication Mobility status        Time: 8682-8665 OT Time Calculation (min): 17 min  Charges: OT General Charges $OT Visit: 1 Visit OT Treatments $Self Care/Home Management : 8-22  mins  Jason Jennings, M.S., OTR/L 11/10/23, 3:29 PM

## 2023-11-10 NOTE — Progress Notes (Signed)
 Advanced Heart Failure Rounding Note  Cardiologist: None  Chief Complaint: Chest pain, acute renal failure Subjective:    No further arrhythmia events yesterday.  Hemoglobin is slowly drifted downwards, now less than 8.  After discussion with primary team, will transfuse 1 unit of blood for recent MI, symptomatic anemia, and renal dysfunction.  Feeling somewhat better after dialysis, less lethargic.  Objective:   Weight Range: 90 kg Body mass index is 32.02 kg/m.   Vital Signs:   Temp:  [97.6 F (36.4 C)-98.6 F (37 C)] 98.6 F (37 C) (10/03 1200) Pulse Rate:  [63-88] 80 (10/03 1200) Resp:  [10-26] 18 (10/03 1200) BP: (112-142)/(38-83) 138/71 (10/03 1200) SpO2:  [94 %-99 %] 98 % (10/03 1200) Weight:  [89.7 kg-90 kg] 90 kg (10/03 1030) Last BM Date :  (colostomy)  Weight change: Filed Weights   11/10/23 0500 11/10/23 0747 11/10/23 1030  Weight: 89.7 kg 90 kg 90 kg    Intake/Output:   Intake/Output Summary (Last 24 hours) at 11/10/2023 1316 Last data filed at 11/10/2023 1130 Gross per 24 hour  Intake --  Output 850 ml  Net -850 ml      Physical Exam    GENERAL: Chronically ill appearing PULM: Normal work of breathing CARDIAC:  JVP: Flat         Normal rate with regular rhythm. No murmurs, rubs or gallops.  Trace edema. Warm and well perfused extremities. ABDOMEN: Nontender NEUROLOGIC: Patient is oriented x3 with no focal or lateralizing neurologic deficits.     Medications:     Scheduled Medications:  amoxicillin   500 mg Oral Q12H   aspirin  81 mg Oral Daily   Chlorhexidine  Gluconate Cloth  6 each Topical Daily   feeding supplement (GLUCERNA SHAKE)  237 mL Oral TID BM   heparin  injection (subcutaneous)  5,000 Units Subcutaneous Q8H   insulin  aspart  0-15 Units Subcutaneous TID WC   insulin  aspart  0-5 Units Subcutaneous QHS   insulin  aspart  5 Units Subcutaneous TID WC   insulin  glargine  25 Units Subcutaneous Daily   melatonin  5 mg Oral QHS    metoprolol  succinate  25 mg Oral Daily   multivitamin with minerals  1 tablet Oral Daily   pantoprazole   40 mg Oral BID   polyethylene glycol  17 g Oral BID   prasugrel  10 mg Oral Daily   rosuvastatin   20 mg Oral Daily   senna  2 tablet Oral BID   sodium chloride  flush  3 mL Intravenous Q12H   tamsulosin   0.4 mg Oral BID   thiamine  100 mg Oral Daily   traZODone  100 mg Oral QHS   Vitamin D  (Ergocalciferol )  50,000 Units Oral Q7 days    Infusions:  promethazine  (PHENERGAN ) injection (IM or IVPB) Stopped (11/04/23 1639)    PRN Medications: acetaminophen , alum & mag hydroxide-simeth, bisacodyl, HYDROmorphone  (DILAUDID ) injection, lactulose, ondansetron  (ZOFRAN ) IV, mouth rinse, promethazine  (PHENERGAN ) injection (IM or IVPB), sodium chloride  flush, traMADol     Patient Profile   Jason Jennings is a 58 y.o. male with a PMH of colon cancer s/p LAR, CKD stage III who presents with late presenting anterior STEMI complicated by acute renal failure.   Assessment/Plan   Acute systolic heart failure: First session of IHD 10/2.  Appears near euvolemic. - Ischemic, suspect significant residual scar given late presentation - Unable to tolerate significant GDMT given renal disease -Targeting slightly higher MAP goal given acute renal failure, if MAP consistently  above 90 will consider starting low-dose hydralazine/Isordil - Continue metoprolol  succinate 25 mg daily, does not appear low output   Anterior STEMI: Late presenting, suspect will have significant infarct.  Residual RCA disease. - Will eventually benefit from revascularization, but renal function is currently prohibitive -Discussion today, given hope for renal recovery will table any further discussions of RCA PCI until his creatinine trend is determined - Will probably have to be staged as an outpatient if at all - Continue aspirin/prasugrel - Continue Crestor  20 mg daily   Acute renal failure: Significant prior renal disease.   Now requiring IHD.  Hoping for renal recovery. - Continue to hold nephrotoxic agents  Atrial fibrillation: Afib with RVR 10/1. Resolved with amiodarone, less than 12 hour duration. Given short duration, critical illness, will hold on OAC at this time. If recurrent will need transition to OAC and antiplatelet adjustment. - Continue BB   Hypertension: Relatively well-controlled  Deconditioning: Severely deconditioned, he is struggling to get out of bed.  - Needs aggressive mobilization, PT/OT placed - Bowel regimen - Strongly encourage OOB to chair  Medication concerns reviewed with patient and pharmacy team. Barriers identified: Worsening renal function  Length of Stay: 7  Morene JINNY Brownie, MD  11/10/2023, 1:16 PM  Advanced Heart Failure Team Pager [phone] (M-F; 7a - 5p)  Please contact [facility] for night-coverage after hours (5p -7a ) and weekends on [email or web address]

## 2023-11-10 NOTE — Progress Notes (Signed)
 PT Cancellation Note  Patient Details Name: Jason Jennings MRN: [id number] DOB: 02-06-1966   Cancelled Treatment:    Reason Eval/Treat Not Completed: Patient at procedure or test/unavailable Pt noted to be OTF at dialysis. Will f/u as able.  Richerd Pinal, PT, DPT 11/10/23, 8:43 AM   [name] 11/10/2023, 8:42 AM

## 2023-11-10 NOTE — Plan of Care (Signed)
  Problem: Education: Goal: Ability to describe self-care measures that may prevent or decrease complications (Diabetes Survival Skills Education) will improve Outcome: Progressing   Problem: Coping: Goal: Ability to adjust to condition or change in health will improve Outcome: Progressing   Problem: Fluid Volume: Goal: Ability to maintain a balanced intake and output will improve Outcome: Progressing   Problem: Metabolic: Goal: Ability to maintain appropriate glucose levels will improve Outcome: Progressing   Problem: Nutritional: Goal: Maintenance of adequate nutrition will improve Outcome: Progressing   Problem: Skin Integrity: Goal: Risk for impaired skin integrity will decrease Outcome: Progressing   Problem: Tissue Perfusion: Goal: Adequacy of tissue perfusion will improve Outcome: Progressing   Problem: Education: Goal: Ability to describe self-care measures that may prevent or decrease complications (Diabetes Survival Skills Education) will improve Outcome: Progressing   Problem: Cardiac: Goal: Ability to maintain an adequate cardiac output will improve Outcome: Progressing   Problem: Fluid Volume: Goal: Ability to achieve a balanced intake and output will improve Outcome: Progressing   Problem: Nutritional: Goal: Maintenance of adequate nutrition will improve Outcome: Progressing   Problem: Respiratory: Goal: Will regain and/or maintain adequate ventilation Outcome: Progressing   Problem: Urinary Elimination: Goal: Ability to achieve and maintain adequate renal perfusion and functioning will improve Outcome: Progressing   Problem: Clinical Measurements: Goal: Will remain free from infection Outcome: Progressing Goal: Diagnostic test results will improve Outcome: Progressing

## 2023-11-10 NOTE — Progress Notes (Signed)
 Hemodialysis Note:  Received patient in bed to unit. Alert and oriented. Informed consent singed and in chart.  Treatment initiated: 0755 Treatment completed: 1030  Access used: Left internal jugular catheter Access issues: None  Patient tolerated well. Transported back to room, alert without acute distress. Report given to patient's RN.  Total UF removed: 0 Medications given: None  Post HD weight: 90 Kg  [facility]

## 2023-11-10 NOTE — Progress Notes (Signed)
   Inpatient Rehabilitation Admissions Coordinator   I will place rehab consult for full assessment of candidacy for possible Cir admit.  Heron Leavell, RN, MSN Rehab Admissions Coordinator [phone] 11/10/2023 5:56 PM

## 2023-11-10 NOTE — Progress Notes (Signed)
 PROGRESS NOTE    Jason Jennings   FMW:[id number] DOB: 1965-06-14  DOA: 11/02/2023 Date of Service: 11/10/23 which is hospital day 7  PCP: Salli Amato, MD    Hospital course / significant events:   HPI: Jason Jennings is a 58 y.o. 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   He presented with 3 days of nausea, nonbloody, nonbilious, non-coffee-ground vomiting and left upper abdominal pain .  Denies fever or chills, cough or chest pain or change in bowel habits.  Emesis is nonbloody and nonbilious.  Has a history of pancreatitis about 12 years prior.  Denies alcohol use.   09/25: to ED. (+)DKA.  09/26: on insulin  gtt 09/27: (+)STEMI -  of note, EKG/troponin not done in ED and suspect late presentation. Taken for cath/PCI w/ stent to LAD. TTE LVEF 20 to 25%, global hypokinesis, mild to moderate TR. DC'd IV fluid, careful diuresis w/ renal fxn. Urinary retention w/ bladder scan >400 ml. Foley catheter was inserted, 675 ml urine was collected 09/28: L hand numbness, MRI brain no CVA.  09/29: creatinine 4.1, elevated, Discontinued IV Lasix today 09/30: sCr 4.96, elevated, consulted nephrology  10/01: into new Afib RVR but converted back to sinus on amio, will leave on amio gtt for now. D/w cardiology - expect some persistent ST elevation given severity of MI, if chest pain recurrence repeat 12-lead EKG and alert cardiology team.  10/02: amio gtt dc d/t bradycardia. Doing better this morning. Planning to start dialysis tomorrow  10/03: giving 1 unit PRBC. HD#1 today    Consultants:  Cardiology  Heart Failure team  Nephrology   Procedures/Surgeries: Cardiac cath w/ PCI        ASSESSMENT & PLAN:   STEMI found to have EKG changes on 9/27, likely late presenting STEMI in setting of ongoing ACS likely w/ MI since arrival, atypical presentation with abdominal pain and nausea/vomiting.  Findings were masked by diabetic ketoacidosis and slightly elevated lipase. EKG and Trop were not done in the ED on admission. S/p LAD stent DAPT at least 12 months. Aspirin 81 mg p.o. daily, prasugrel 10 mg p.o. daily Once his medical issues and renal function improved, we will consider proceeding with RCA PCI.   See below re: other cardiac problems, CHF. .D/w cardiology  expect some persistent ST elevation given severity of MI, if chest pain recurrence repeat 12-lead EKG and alert cardiology team.  Also expect troponin levels will be high / may not be clinically helpful, defer repeat troponin to cardiology.    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 TTE: LVEF 20 to 25%, global hypokinesis, mild to moderate TR Caution w/ diuresis and GDMT given higher MAP goal w/ renal fxn, renal fxn itself also impacting Rx  Cardiology managing medications    Acute renal failure superimposed on stage 3b chronic kidney disease Likely ATN from volume depletion related to fluid losses from vomiting S/p  IV fluid resuscitation, stopped w/ low EF  Nephrology following   Diabetic ketoacidosis without coma associated with type 2 diabetes mellitus - DKA resolved w/ tx  Lantus  + NovoLog  sliding scale Monitor CBG, continue diabetic diet   Acute pancreatitis Elevated lipase (question reactive) Intractable nausea and vomiting due to pancreatitis vs DKA vs  gastroparesis. Resolved  PPI Po hydration     Pseudhyponatremia d/t hyperglycemia 11/09/23 sodium 130 corrects to 132 w/ Glc 227, still mild hyponatremia likely d/t CHF  Monitor BMP Correct sodium for glucose as needed Address hyponatremia based on underlying cause(s) see CHF   Hypokalemia:  Replete cautiously due to renal failure Hypophosphatemia: repleted orally Check electrolytes daily   Intermittent urinary retention 9/27 bladder scan >400 ml Foley catheter was inserted, 675 ml urine was collected Continue Foley catheter for 1 week Start Flomax  0.4 mg p.o. 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 Monitor BP and titrate medications accordingly   Vitamin D  insufficiency:  started vitamin D  50,000 units p.o. weekly, follow with PCP to repeat vitamin D  level after 3 to 6 months.   Constipation,  Laxatives w/ senna added lactulose today     Class 1 obesity based on BMI: Body mass index is 31.31 kg/m.[name] Significantly low or high BMI is associated with higher medical risk.  Underweight - under 18  overweight - 25 to 29 obese - 30 or more Class 1 obesity: BMI of 30.0 to 34 Class 2 obesity: BMI of 35.0 to 39 Class 3 obesity: BMI of 40.0 to 49 Super Morbid Obesity: BMI 50-59 Super-super Morbid Obesity: BMI 60+ Healthy nutrition and physical activity advised as adjunct to other disease management and risk reduction treatments    DVT prophylaxis: heparin  IV fluids: no continuous IV fluids  Nutrition: cardiac Central lines / other devices: colostomy, foley - removing foley today   Code Status: FULL CODE ACP documentation reviewed:  none on file in [facility]  TOC needs: TBD expect will  need rehab Medical barriers to dispo: needing dialysis. Expected medical readiness for discharge several more days / pending need for possible permanent dialysis              Subjective / Brief ROS:  Patient reports feeling okay today other than still very tired and easily winded  Examined in HD suite on AM rounds  Denies CP/SOB.  Pain controlled.  Denies new weakness.  Tolerating diet.    Family Communication: none at this time     Objective Findings:  Vitals:   11/10/23 0900 11/10/23 0930 11/10/23 1000 11/10/23 1030  BP: 130/72 (!) 142/83 (!) 140/75 132/78  Pulse: 77 78 80 79  Resp: 16     Temp:      TempSrc:      SpO2: 96% 98% 97% 98%  Weight:      Height:        Intake/Output Summary (Last 24 hours) at 11/10/2023 1042 Last data filed at 11/09/2023 2018 Gross per 24 hour  Intake 240 ml  Output 450 ml  Net -210 ml   Filed Weights   11/09/23 0500 11/10/23 0500 11/10/23 0747  Weight: 86.6 kg 89.7 kg 90 kg  Examination:  Physical Exam Constitutional:      Appearance: He is ill-appearing.  Cardiovascular:     Rate and Rhythm: Normal rate and regular rhythm.  Pulmonary:     Effort: Pulmonary effort is normal. No respiratory distress.  Abdominal:     General: Bowel sounds are normal.  Musculoskeletal:     Right lower leg: No edema.     Left lower leg: No edema.  Skin:    General: Skin is warm.     Coloration: Skin is pale.  Neurological:     General: No focal deficit present.     Mental Status: He is alert and oriented to person, place, and time. Mental status is at baseline.  Psychiatric:        Mood and Affect: Mood normal.        Behavior: Behavior normal.          Scheduled Medications:   sodium chloride    Intravenous Once   amoxicillin   500 mg Oral Q12H   aspirin  81 mg Oral Daily   Chlorhexidine  Gluconate Cloth  6 each Topical Daily   feeding supplement (GLUCERNA SHAKE)  237 mL Oral TID BM   heparin  injection (subcutaneous)   5,000 Units Subcutaneous Q8H   insulin  aspart  0-15 Units Subcutaneous TID WC   insulin  aspart  0-5 Units Subcutaneous QHS   insulin  aspart  5 Units Subcutaneous TID WC   insulin  glargine  25 Units Subcutaneous Daily   melatonin  5 mg Oral QHS   metoprolol  succinate  25 mg Oral Daily   multivitamin with minerals  1 tablet Oral Daily   pantoprazole   40 mg Oral BID   polyethylene glycol  17 g Oral BID   prasugrel  10 mg Oral Daily   rosuvastatin   20 mg Oral Daily   senna  2 tablet Oral BID   sodium chloride  flush  3 mL Intravenous Q12H   tamsulosin   0.4 mg Oral BID   thiamine  100 mg Oral Daily   traZODone  100 mg Oral QHS   Vitamin D  (Ergocalciferol )  50,000 Units Oral Q7 days    Continuous Infusions:  amiodarone Stopped (11/09/23 0204)   promethazine  (PHENERGAN ) injection (IM or IVPB) Stopped (11/04/23 1639)    PRN Medications:  acetaminophen , alum & mag hydroxide-simeth, bisacodyl, HYDROmorphone  (DILAUDID ) injection, lactulose, ondansetron  (ZOFRAN ) IV, mouth rinse, promethazine  (PHENERGAN ) injection (IM or IVPB), sodium chloride  flush, traMADol   Antimicrobials from admission:  Anti-infectives (From admission, onward)    Start     Dose/Rate Route Frequency Ordered Stop   11/09/23 1445  amoxicillin  (AMOXIL ) capsule 500 mg  Status:  Discontinued        500 mg Oral Every 12 hours 11/09/23 1350 11/09/23 1351   11/09/23 1445  amoxicillin  (AMOXIL ) capsule 500 mg        500 mg Oral Every 12 hours 11/09/23 1351 11/14/23 0959   11/07/23 1500  cefTRIAXone  (ROCEPHIN ) 1 g in sodium chloride  0.9 % 100 mL IVPB  Status:  Discontinued        1 g 200 mL/hr over 30 Minutes Intravenous Every 24 hours 11/07/23 1410 11/09/23 1350           Data Reviewed:  I have personally reviewed the following...  CBC: Recent Labs  Lab 11/06/23 0441 11/07/23 0525 11/08/23 0454 11/09/23 0449 11/10/23 0516  WBC 7.8 7.1 4.7 5.0 4.9  HGB 9.8* 9.0* 9.3* 8.7* 7.8*  HCT 29.1* 27.7* 28.3* 26.2* 22.8*   MCV 85.1  87.1 85.8 85.1 84.4  PLT 180 188 195 180 190   Basic Metabolic Panel: Recent Labs  Lab 11/04/23 0506 11/04/23 1316 11/05/23 0317 11/06/23 0441 11/06/23 1442 11/07/23 0525 11/08/23 0454 11/09/23 0449 11/10/23 0516  NA 138  --  139 135 132* 134* 135 130* 135  K 3.9  --  3.1* 3.9 4.1 4.1 3.8 4.1 4.0  CL 103  --  102 102 96* 98 98 95* 98  CO2 23  --  23 23 22 24 23  21* 26  GLUCOSE 290*  --  103* 149* 310* 176* 99 227* 167*  BUN 57*  --  54* 65* 75* 80* 82* 97* 70*  CREATININE 3.02*   < > 2.84* 4.10* 4.61* 4.96* 5.20* 5.37* 4.53*  CALCIUM  7.8*  --  7.9* 7.8* 7.8* 8.1* 8.1* 7.8* 7.9*  MG 2.2  --  2.2 2.3  --  2.7*  --  3.1*  --   PHOS 4.6  --  4.3 5.1*  --  5.2*  --   --   --    < > = values in this interval not displayed.   GFR: Estimated Creatinine Clearance: 18.5 mL/min (A) (by C-G formula based on SCr of 4.53 mg/dL (H)). Liver Function Tests: Recent Labs  Lab 11/04/23 0506 11/05/23 0317 11/06/23 0441  AST 79* 60* 56*  ALT 27 26 31   ALKPHOS 70 65 82  BILITOT 1.3* 0.7 0.5  PROT 5.4* 5.6* 5.5*  ALBUMIN 2.0* 2.1* 1.9*   Recent Labs  Lab 11/04/23 0506 11/05/23 0317  LIPASE 41 21   No results for input(s): AMMONIA in the last 168 hours. Coagulation Profile: Recent Labs  Lab 11/04/23 1316 11/09/23 1413  INR 1.6* 1.3*   Cardiac Enzymes: No results for input(s): CKTOTAL, CKMB, CKMBINDEX, TROPONINI in the last 168 hours. BNP (last 3 results) No results for input(s): PROBNP in the last 8760 hours. HbA1C: No results for input(s): HGBA1C in the last 72 hours. CBG: Recent Labs  Lab 11/09/23 0750 11/09/23 1137 11/09/23 1556 11/09/23 2108 11/10/23 0716  GLUCAP 225* 256* 199* 120* 178*   Lipid Profile: No results for input(s): CHOL, HDL, LDLCALC, TRIG, CHOLHDL, LDLDIRECT in the last 72 hours. Thyroid Function Tests: No results for input(s): TSH, T4TOTAL, FREET4, T3FREE, THYROIDAB in the last 72 hours. Anemia  Panel: No results for input(s): VITAMINB12, FOLATE, FERRITIN, TIBC, IRON, RETICCTPCT in the last 72 hours.  Most Recent Urinalysis On File:     Component Value Date/Time   COLORURINE YELLOW (A) 11/07/2023 1229   APPEARANCEUR TURBID (A) 11/07/2023 1229   LABSPEC 1.017 11/07/2023 1229   PHURINE 5.0 11/07/2023 1229   GLUCOSEU 150 (A) 11/07/2023 1229   HGBUR MODERATE (A) 11/07/2023 1229   BILIRUBINUR NEGATIVE 11/07/2023 1229   KETONESUR NEGATIVE 11/07/2023 1229   PROTEINUR >=300 (A) 11/07/2023 1229   NITRITE NEGATIVE 11/07/2023 1229   LEUKOCYTESUR LARGE (A) 11/07/2023 1229   Sepsis Labs: @LABRCNTIP (procalcitonin:4,lacticidven:4) Microbiology: Recent Results (from the past 240 hours)  MRSA Next Gen by PCR, Nasal     Status: Abnormal   Collection Time: 11/03/23  6:15 AM   Specimen: Nasal Mucosa; Nasal Swab  Result Value Ref Range Status   MRSA by PCR Next Gen DETECTED (A) NOT DETECTED Final    Comment: RESULT CALLED TO, READ BACK BY AND VERIFIED WITH: HANNA SPROUT @0732  11/03/23 MJU (NOTE) The GeneXpert MRSA Assay (FDA approved for NASAL specimens only), is one component of a comprehensive MRSA colonization surveillance program. It is not  intended to diagnose MRSA infection nor to guide or monitor treatment for MRSA infections. Test performance is not FDA approved in patients less than 77 years old. Performed at [facility], [address] [place], [place] [address]   Urine Culture (for pregnant, neutropenic or urologic patients or patients with an indwelling urinary catheter)     Status: Abnormal   Collection Time: 11/07/23 12:29 PM   Specimen: Urine, Clean Catch  Result Value Ref Range Status   Specimen Description   Final    URINE, CLEAN CATCH Performed at [facility], [address], [place], [place] [address]    Special Requests   Final    NONE Performed at [facility], [address], [place], [place] [address]    Culture  >=100,000 COLONIES/mL ENTEROCOCCUS FAECALIS (A)  Final   Report Status 11/09/2023 FINAL  Final   Organism ID, Bacteria ENTEROCOCCUS FAECALIS (A)  Final      Susceptibility   Enterococcus faecalis - MIC*    AMPICILLIN  <=2 SENSITIVE Sensitive     NITROFURANTOIN <=16 SENSITIVE Sensitive     VANCOMYCIN  2 SENSITIVE Sensitive     * >=100,000 COLONIES/mL ENTEROCOCCUS FAECALIS      Radiology Studies last 3 days: DG Chest Port 1 View Result Date: 11/09/2023 CLINICAL DATA:  Central line placement. EXAM: PORTABLE CHEST 1 VIEW COMPARISON:  November 05, 2023. FINDINGS: Stable cardiomegaly with central pulmonary vascular congestion. Interval placement of left internal jugular catheter with distal tip in expected position of the SVC. No pneumothorax is noted. IMPRESSION: Interval placement of left internal jugular catheter with distal tip in expected position of the SVC. Electronically Signed   By: Lynwood Landy Raddle M.D.   On: 11/09/2023 16:17        Time spent: 35 min   Additional critical care time this afternoon w/ Afib RVR episode   CRITICAL CARE Performed by: Laneta Blunt   Total critical care time: 45 minutes  Critical care time was exclusive of separately billable procedures and treating other patients.  Critical care was necessary to treat or prevent imminent or life-threatening deterioration.  Critical care was time spent personally by me on the following activities: development of treatment plan with patient and/or surrogate as well as nursing, discussions with consultants, evaluation of patient's response to treatment, examination of patient, obtaining history from patient or surrogate, ordering and performing treatments and interventions, ordering and review of laboratory studies, ordering and review of radiographic studies, pulse oximetry and re-evaluation of patient's condition.     Calais Svehla, DO [facility] 11/10/2023, 10:42 AM    Dictation software may  have been used to generate the above note. Typos may occur and escape review in typed/dictated notes. Please contact Dr Blunt directly for clarity if needed.  Staff may message me via secure chat in Epic  but this may not receive an immediate response,  please page me for urgent matters!  If 7PM-7AM, please contact night coverage [email or web address]

## 2023-11-10 NOTE — Progress Notes (Signed)
 [facility]  ROUNDING NOTE   Subjective:   Patient seen and evaluated during dialysis   HEMODIALYSIS FLOWSHEET:  Blood Flow Rate (mL/min): 249 mL/min Arterial Pressure (mmHg): -87.87 mmHg Venous Pressure (mmHg): 91.11 mmHg TMP (mmHg): 17.57 mmHg Ultrafiltration Rate (mL/min): 363 mL/min Dialysate Flow Rate (mL/min): 300 ml/min  Tolerating treatment well Complains of discomfort, positional  Objective:  Vital signs in last 24 hours:  Temp:  [97.6 F (36.4 C)-98.6 F (37 C)] 98.6 F (37 C) (10/03 1200) Pulse Rate:  [63-88] 80 (10/03 1200) Resp:  [10-26] 18 (10/03 1200) BP: (112-142)/(38-83) 138/71 (10/03 1200) SpO2:  [94 %-99 %] 98 % (10/03 1200) Weight:  [89.7 kg-90 kg] 90 kg (10/03 1030)  Weight change: 3.1 kg Filed Weights   11/10/23 0500 11/10/23 0747 11/10/23 1030  Weight: 89.7 kg 90 kg 90 kg    Intake/Output: I/O last 3 completed shifts: In: 409.1 [P.O.:240; I.V.:169.1] Out: 850 [Urine:850]   Intake/Output this shift:  Total I/O In: -  Out: 400 [Urine:400]  Physical Exam: General: NAD  Head: Normocephalic, atraumatic. Moist oral mucosal membranes  Eyes: Anicteric  Lungs:  Clear to auscultation, normal effort  Heart: Regular rate and rhythm  Abdomen:  Soft, nontender  Extremities:  No peripheral edema.  Neurologic: Awake, alert, conversant  Skin: Warm,dry, no rash       Basic Metabolic Panel: Recent Labs  Lab 11/04/23 0506 11/04/23 1316 11/05/23 0317 11/06/23 0441 11/06/23 1442 11/07/23 0525 11/08/23 0454 11/09/23 0449 11/10/23 0516  NA 138  --  139 135 132* 134* 135 130* 135  K 3.9  --  3.1* 3.9 4.1 4.1 3.8 4.1 4.0  CL 103  --  102 102 96* 98 98 95* 98  CO2 23  --  23 23 22 24 23  21* 26  GLUCOSE 290*  --  103* 149* 310* 176* 99 227* 167*  BUN 57*  --  54* 65* 75* 80* 82* 97* 70*  CREATININE 3.02*   < > 2.84* 4.10* 4.61* 4.96* 5.20* 5.37* 4.53*  CALCIUM  7.8*  --  7.9* 7.8* 7.8* 8.1* 8.1* 7.8* 7.9*  MG 2.2  --  2.2 2.3  --   2.7*  --  3.1*  --   PHOS 4.6  --  4.3 5.1*  --  5.2*  --   --   --    < > = values in this interval not displayed.    Liver Function Tests: Recent Labs  Lab 11/04/23 0506 11/05/23 0317 11/06/23 0441  AST 79* 60* 56*  ALT 27 26 31   ALKPHOS 70 65 82  BILITOT 1.3* 0.7 0.5  PROT 5.4* 5.6* 5.5*  ALBUMIN 2.0* 2.1* 1.9*   Recent Labs  Lab 11/04/23 0506 11/05/23 0317  LIPASE 41 21   No results for input(s): AMMONIA in the last 168 hours.  CBC: Recent Labs  Lab 11/06/23 0441 11/07/23 0525 11/08/23 0454 11/09/23 0449 11/10/23 0516  WBC 7.8 7.1 4.7 5.0 4.9  HGB 9.8* 9.0* 9.3* 8.7* 7.8*  HCT 29.1* 27.7* 28.3* 26.2* 22.8*  MCV 85.1 87.1 85.8 85.1 84.4  PLT 180 188 195 180 190    Cardiac Enzymes: No results for input(s): CKTOTAL, CKMB, CKMBINDEX, TROPONINI in the last 168 hours.  BNP: Invalid input(s): POCBNP  CBG: Recent Labs  Lab 11/09/23 1137 11/09/23 1556 11/09/23 2108 11/10/23 0716 11/10/23 1052  GLUCAP 256* 199* 120* 178* 124*    Microbiology: Results for orders placed or performed during the hospital encounter of 11/02/23  MRSA Next Gen by PCR, Nasal     Status: Abnormal   Collection Time: 11/03/23  6:15 AM   Specimen: Nasal Mucosa; Nasal Swab  Result Value Ref Range Status   MRSA by PCR Next Gen DETECTED (A) NOT DETECTED Final    Comment: RESULT CALLED TO, READ BACK BY AND VERIFIED WITH: HANNA SPROUT @0732  11/03/23 MJU (NOTE) The GeneXpert MRSA Assay (FDA approved for NASAL specimens only), is one component of a comprehensive MRSA colonization surveillance program. It is not intended to diagnose MRSA infection nor to guide or monitor treatment for MRSA infections. Test performance is not FDA approved in patients less than 47 years old. Performed at [facility], [address] [place], [place] [address]   Urine Culture (for pregnant, neutropenic or urologic patients or patients with an indwelling urinary catheter)      Status: Abnormal   Collection Time: 11/07/23 12:29 PM   Specimen: Urine, Clean Catch  Result Value Ref Range Status   Specimen Description   Final    URINE, CLEAN CATCH Performed at [facility], [address], [place], [place] [address]    Special Requests   Final    NONE Performed at [facility], [address] [place], [place] [address]    Culture >=100,000 COLONIES/mL ENTEROCOCCUS FAECALIS (A)  Final   Report Status 11/09/2023 FINAL  Final   Organism ID, Bacteria ENTEROCOCCUS FAECALIS (A)  Final      Susceptibility   Enterococcus faecalis - MIC*    AMPICILLIN  <=2 SENSITIVE Sensitive     NITROFURANTOIN <=16 SENSITIVE Sensitive     VANCOMYCIN  2 SENSITIVE Sensitive     * >=100,000 COLONIES/mL ENTEROCOCCUS FAECALIS    Coagulation Studies: Recent Labs    11/09/23 1413  LABPROT 17.1*  INR 1.3*    Urinalysis: Recent Labs    11/07/23 1229  COLORURINE YELLOW*  LABSPEC 1.017  PHURINE 5.0  GLUCOSEU 150*  HGBUR MODERATE*  BILIRUBINUR NEGATIVE  KETONESUR NEGATIVE  PROTEINUR >=300*  NITRITE NEGATIVE  LEUKOCYTESUR LARGE*      Imaging: DG Chest Port 1 View Result Date: 11/09/2023 CLINICAL DATA:  Central line placement. EXAM: PORTABLE CHEST 1 VIEW COMPARISON:  November 05, 2023. FINDINGS: Stable cardiomegaly with central pulmonary vascular congestion. Interval placement of left internal jugular catheter with distal tip in expected position of the SVC. No pneumothorax is noted. IMPRESSION: Interval placement of left internal jugular catheter with distal tip in expected position of the SVC. Electronically Signed   By: Lynwood Landy Raddle M.D.   On: 11/09/2023 16:17     Medications:    promethazine  (PHENERGAN ) injection (IM or IVPB) Stopped (11/04/23 1639)    amoxicillin   500 mg Oral Q12H   aspirin  81 mg Oral Daily   Chlorhexidine  Gluconate Cloth  6 each Topical Daily   feeding supplement (GLUCERNA SHAKE)  237 mL Oral TID BM   heparin  injection  (subcutaneous)  5,000 Units Subcutaneous Q8H   insulin  aspart  0-15 Units Subcutaneous TID WC   insulin  aspart  0-5 Units Subcutaneous QHS   insulin  aspart  5 Units Subcutaneous TID WC   insulin  glargine  25 Units Subcutaneous Daily   melatonin  5 mg Oral QHS   metoprolol  succinate  25 mg Oral Daily   multivitamin with minerals  1 tablet Oral Daily   pantoprazole   40 mg Oral BID   polyethylene glycol  17 g Oral BID   prasugrel  10 mg Oral Daily   rosuvastatin   20 mg Oral Daily   senna  2 tablet Oral BID   sodium chloride  flush  3 mL Intravenous Q12H   tamsulosin   0.4 mg Oral BID   thiamine  100 mg Oral Daily   traZODone  100 mg Oral QHS   Vitamin D  (Ergocalciferol )  50,000 Units Oral Q7 days   acetaminophen , alum & mag hydroxide-simeth, bisacodyl, HYDROmorphone  (DILAUDID ) injection, lactulose, ondansetron  (ZOFRAN ) IV, mouth rinse, promethazine  (PHENERGAN ) injection (IM or IVPB), sodium chloride  flush, traMADol   Assessment/ Plan:  [name] is a 58 y.o.  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  Baseline creatinine appears to be 2.85 with GFR 25 on 08/20/2023.  Acute kidney injury likely multifactorial from IV contrast exposure, dehydration, and diuresis.  Creatinine 3.29 on ED arrival.  Diuretics have been held.  IV contrast exposure on 9/27.   Patient was agreeable to proceed with renal replacement therapy.  Appreciate critical care team placing HD temp cath.  Patient received initial dialysis treatment yesterday, tolerated well.  Receiving second treatment today, no UF.  Will plan for third dialysis treatment tomorrow.  Will hold after that treatment and monitor renal function.  Lab Results   Component Value Date   CREATININE 4.53 (H) 11/10/2023   CREATININE 5.37 (H) 11/09/2023   CREATININE 5.20 (H) 11/08/2023    Intake/Output Summary (Last 24 hours) at 11/10/2023 1211 Last data filed at 11/10/2023 1130 Gross per 24 hour  Intake 240 ml  Output 850 ml  Net -610 ml   2.  Hypokalemia/hyponatremia.  Likely secondary to kidney injury.  Sodium 135 today   3.  Hypertension with chronic kidney disease.  Currently receiving with metoprolol  only.  Blood pressure 132/78 during dialysis.   4. Diabetes mellitus type II with chronic kidney disease/renal manifestations: insulin  dependent. Home regimen includes Humalog  75/25 pin. Most recent hemoglobin A1c is 11.0 on 08/12/2023.   Glucose  well-managed by primary team    LOS: 7 Mckinnley Cottier 10/3/202512:11 PM

## 2023-11-10 NOTE — Plan of Care (Signed)
  Problem: Fluid Volume: Goal: Ability to maintain a balanced intake and output will improve Outcome: Progressing   Problem: Urinary Elimination: Goal: Ability to achieve and maintain adequate renal perfusion and functioning will improve Outcome: Progressing   Problem: Pain Managment: Goal: General experience of comfort will improve and/or be controlled Outcome: Progressing

## 2023-11-10 NOTE — Progress Notes (Signed)
 Nutrition Follow Up Note   DOCUMENTATION CODES:   Obesity unspecified  INTERVENTION:   Glucerna Shake po TID, each supplement provides 220 kcal and 10 grams of protein  MVI po daily   Thiamine 100mg  po daily x 7 days   Add Rena-vite po daily   Pt at high refeed risk; recommend monitor potassium, magnesium and phosphorus labs daily until stable  Daily weights  NUTRITION DIAGNOSIS:   Inadequate oral intake related to acute illness as evidenced by per patient/family report. -ongoing   GOAL:   Patient will meet greater than or equal to 90% of their needs -not met   MONITOR:   PO intake, Supplement acceptance, Labs, Weight trends, I & O's, Skin  ASSESSMENT:   58 y/o male with h/o HTN, CHF, CKD III, IDDM, HLD, vitamin D  deficiency, neuropathy, pancreatitis, marijuana use, kidney stones s/p ureteral stent, rectal adenocarcinoma s/p chemoradiation/low anterior resection (2016) with reccurence s/p chemoradiation/open abdominoperineal resection (2020), peristomal hernia (containing small bowel)  and recent hospitalization (7/5-7/13/2025) with sepsis secondary to MRSA cellulitis/abscess left foot s/p I&D and who is now admitted with DKA, 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  -Pt s/p HD initiation 10/2  Met with pt in room. Pt seems more lethargic from the last RD visit. Pt reports that he is feeling a little better. Pt reports that he feels he is eating better and is trying to do the best he can. Pt is documented to have eaten 90% of his lunch today and 100% of his lunch yesterday. Pt is drinking most of the Glucerna. No BM since admission; MD aware and pt is receiving bowel regimen. Pt reports that sometimes he goes five days at home with no output from his ostomy. Pt remains at refeed risk. Per chart, pt is down ~3lbs since admission and appears to be back around his UBW. Pt is noted to have moderate edema. HD initiated yesterday for worsening renal  failure.   Medications reviewed and include: aspirin, heparin , insulin , MVI, protonix , miralax , senokot, thiamine, vitamin D   Labs reviewed: K 4.0 wnl, BUN 70(H), creat 4.53(H)) Vitamin D  24.57(L)- 9/26 Hgb 7.8(L), Hct 22.8(L) Cbgs- 124, 178 x 24 hrs   UOP-   Diet Order:   Diet Order             Diet Carb Modified Fluid consistency: Thin; Fluid restriction: 1500 mL Fluid  Diet effective now                  EDUCATION NEEDS:   Education needs have been addressed  Skin:  Skin Assessment: Reviewed RN Assessment  Last BM:  9/24  Height:   Ht Readings from Last 1 Encounters:  11/02/23 5' 6 (1.676 m)    Weight:   Wt Readings from Last 1 Encounters:  11/10/23 90 kg    Ideal Body Weight:  64.5 kg  BMI:  Body mass index is 32.02 kg/m.  Estimated Nutritional Needs:   Kcal:  1900-2200kcal/day  Protein:  95-110g/day  Fluid:  1.9-2.2L/day  Augustin Shams MS, RD, LDN If unable to be reached, please send secure chat to RD inpatient available from 8:00a-4:00p daily

## 2023-11-11 DIAGNOSIS — I255 Ischemic cardiomyopathy: Secondary | ICD-10-CM

## 2023-11-11 DIAGNOSIS — I2102 ST elevation (STEMI) myocardial infarction involving left anterior descending coronary artery: Secondary | ICD-10-CM | POA: Diagnosis not present

## 2023-11-11 DIAGNOSIS — N170 Acute kidney failure with tubular necrosis: Secondary | ICD-10-CM

## 2023-11-11 DIAGNOSIS — N1832 Chronic kidney disease, stage 3b: Secondary | ICD-10-CM

## 2023-11-11 DIAGNOSIS — I10 Essential (primary) hypertension: Secondary | ICD-10-CM | POA: Diagnosis not present

## 2023-11-11 DIAGNOSIS — I5021 Acute systolic (congestive) heart failure: Secondary | ICD-10-CM | POA: Diagnosis not present

## 2023-11-11 LAB — BASIC METABOLIC PANEL WITH GFR
Anion gap: 11 (ref 5–15)
BUN: 48 mg/dL — ABNORMAL HIGH (ref 6–20)
CO2: 26 mmol/L (ref 22–32)
Calcium: 7.7 mg/dL — ABNORMAL LOW (ref 8.9–10.3)
Chloride: 95 mmol/L — ABNORMAL LOW (ref 98–111)
Creatinine, Ser: 3.38 mg/dL — ABNORMAL HIGH (ref 0.61–1.24)
GFR, Estimated: 20 mL/min — ABNORMAL LOW (ref >60)
Glucose, Bld: 182 mg/dL — ABNORMAL HIGH (ref 70–99)
Potassium: 3.8 mmol/L (ref 3.5–5.1)
Sodium: 132 mmol/L — ABNORMAL LOW (ref 135–145)

## 2023-11-11 LAB — CBC
HCT: 25.6 % — ABNORMAL LOW (ref 39.0–52.0)
Hemoglobin: 8.7 g/dL — ABNORMAL LOW (ref 13.0–17.0)
MCH: 28.6 pg (ref 26.0–34.0)
MCHC: 34 g/dL (ref 30.0–36.0)
MCV: 84.2 fL (ref 80.0–100.0)
Platelets: 191 K/uL (ref 150–400)
RBC: 3.04 MIL/uL — ABNORMAL LOW (ref 4.22–5.81)
RDW: 16 % — ABNORMAL HIGH (ref 11.5–15.5)
WBC: 5.6 K/uL (ref 4.0–10.5)
nRBC: 0 % (ref 0.0–0.2)

## 2023-11-11 LAB — TYPE AND SCREEN
ABO/RH(D): A POS
Antibody Screen: NEGATIVE
Unit division: 0

## 2023-11-11 LAB — GLUCOSE, CAPILLARY
Glucose-Capillary: 172 mg/dL — ABNORMAL HIGH (ref 70–99)
Glucose-Capillary: 135 mg/dL — ABNORMAL HIGH (ref 70–99)
Glucose-Capillary: 171 mg/dL — ABNORMAL HIGH (ref 70–99)
Glucose-Capillary: 152 mg/dL — ABNORMAL HIGH (ref 70–99)

## 2023-11-11 LAB — BPAM RBC
Blood Product Expiration Date: 202511022359
ISSUE DATE / TIME: 202510031349
Unit Type and Rh: 6200

## 2023-11-11 MED ORDER — LIDOCAINE 5 % EX PTCH
2.0000 | MEDICATED_PATCH | CUTANEOUS | Status: DC
  Administered 2023-11-11: 2 via TRANSDERMAL
  Filled 2023-11-11: qty 2

## 2023-11-11 MED ORDER — HEPARIN SODIUM (PORCINE) 1000 UNIT/ML IJ SOLN
INTRAMUSCULAR | Status: AC
  Filled 2023-11-11: qty 5

## 2023-11-11 MED ORDER — LIDOCAINE 5 % EX PTCH
1.0000 | MEDICATED_PATCH | CUTANEOUS | Status: DC
  Filled 2023-11-11: qty 1

## 2023-11-11 MED ORDER — LIDOCAINE 5 % EX PTCH
2.0000 | MEDICATED_PATCH | CUTANEOUS | Status: AC
  Administered 2023-11-11: 2 via TRANSDERMAL
  Filled 2023-11-11: qty 2

## 2023-11-11 NOTE — Plan of Care (Signed)
  Problem: Fluid Volume: Goal: Ability to maintain a balanced intake and output will improve Outcome: Progressing   Problem: Nutritional: Goal: Maintenance of adequate nutrition will improve Outcome: Progressing   Problem: Skin Integrity: Goal: Risk for impaired skin integrity will decrease Outcome: Progressing

## 2023-11-11 NOTE — Progress Notes (Addendum)
 Advanced Heart Failure Rounding Note  Cardiologist: None  Chief Complaint: Chest pain, acute renal failure Subjective:    No acute events overnight.  No more arrhythmia on telemetry.  He notes that dialysis really wiped him out this morning.  Denies any chest pain or other significant complaints.  Objective:   Weight Range: 85 kg Body mass index is 30.25 kg/m.   Vital Signs:   Temp:  [98.2 F (36.8 C)-98.7 F (37.1 C)] 98.2 F (36.8 C) (10/04 1200) Pulse Rate:  [64-118] 82 (10/04 1300) Resp:  [16-23] 18 (10/04 1200) BP: (101-147)/(53-77) 122/58 (10/04 1300) SpO2:  [94 %-100 %] 96 % (10/04 1300) Weight:  [85 kg-85.5 kg] 85 kg (10/04 1118) Last BM Date : 11/01/23  Weight change: Filed Weights   11/11/23 0740 11/11/23 0753 11/11/23 1118  Weight: 85.5 kg 85.5 kg 85 kg    Intake/Output:   Intake/Output Summary (Last 24 hours) at 11/11/2023 1357 Last data filed at 11/11/2023 1240 Gross per 24 hour  Intake 769.17 ml  Output 1250 ml  Net -480.83 ml      Physical Exam    GENERAL: Chronically ill appearing PULM: Normal work of breathing CARDIAC:  JVP: Flat         Normal rate with regular rhythm. No murmurs, rubs or gallops.  No edema. Warm and well perfused extremities. ABDOMEN: Nontender NEUROLOGIC: Patient is oriented x3 with no focal or lateralizing neurologic deficits.     Medications:     Scheduled Medications:  amoxicillin   500 mg Oral Q12H   aspirin  81 mg Oral Daily   Chlorhexidine  Gluconate Cloth  6 each Topical Daily   feeding supplement (GLUCERNA SHAKE)  237 mL Oral TID BM   heparin  injection (subcutaneous)  5,000 Units Subcutaneous Q8H   insulin  aspart  0-15 Units Subcutaneous TID WC   insulin  aspart  0-5 Units Subcutaneous QHS   insulin  aspart  5 Units Subcutaneous TID WC   insulin  glargine  25 Units Subcutaneous Daily   lidocaine   2 patch Transdermal STAT   melatonin  5 mg Oral QHS   metoprolol  succinate  25 mg Oral Daily   multivitamin   1 tablet Oral QHS   multivitamin with minerals  1 tablet Oral Daily   pantoprazole   40 mg Oral BID   polyethylene glycol  17 g Oral BID   prasugrel  10 mg Oral Daily   rosuvastatin   20 mg Oral Daily   senna  2 tablet Oral BID   sodium chloride  flush  3 mL Intravenous Q12H   tamsulosin   0.4 mg Oral BID   thiamine  100 mg Oral Daily   traZODone  100 mg Oral QHS   Vitamin D  (Ergocalciferol )  50,000 Units Oral Q7 days    Infusions:  promethazine  (PHENERGAN ) injection (IM or IVPB) Stopped (11/04/23 1639)    PRN Medications: acetaminophen , alum & mag hydroxide-simeth, bisacodyl, HYDROmorphone  (DILAUDID ) injection, lactulose, ondansetron  (ZOFRAN ) IV, mouth rinse, promethazine  (PHENERGAN ) injection (IM or IVPB), sodium chloride  flush, traMADol     Patient Profile   Jason Jennings is a 58 y.o. male with a PMH of colon cancer s/p LAR, CKD stage III who presents with late presenting anterior STEMI complicated by acute renal failure.   Assessment/Plan   Acute systolic heart failure: First session of IHD 10/2.  Appears euvolemic. - Ischemic, suspect significant residual scar given late presentation - Unable to tolerate significant GDMT given renal disease - Targeting slightly higher MAP goal given acute renal failure,  if MAP consistently above 90 will consider starting low-dose hydralazine/Isordil - Continue metoprolol  succinate 25 mg daily, does not appear low output   Anterior STEMI: Late presenting, suspect will have significant infarct.  Residual severe RCA disease. - Would benefit from complete revascularization, but renal function is currently prohibitive - Staged RCA PCI will need to wait until his renal recovery trend is determined - Continue aspirin/prasugrel - Continue Crestor  20 mg daily   Acute renal failure: Significant prior renal disease.  Now requiring IHD.  Hoping for renal recovery. - Continue to hold nephrotoxic agents  Atrial fibrillation: Afib with RVR 10/1.  Resolved with amiodarone, less than 12 hour duration. Given short duration, critical illness, significant anemia, will hold on OAC at this time. If recurrent will need transition to OAC and antiplatelet adjustment. - Continue BB as above   Hypertension: Relatively well-controlled  Deconditioning: Severely deconditioned, he is struggling to get out of bed.  - Needs aggressive mobilization, PT/OT placed - Bowel regimen - Strongly encourage OOB to chair  Medication concerns reviewed with patient and pharmacy team. Barriers identified: Worsening renal function  Length of Stay: 8  Caron Poser, MD  11/11/2023, 1:57 PM  Advanced Heart Failure Team Pager [phone] (M-F; 7a - 5p)  Please contact [facility] for night-coverage after hours (5p -7a ) and weekends on [email or web address]

## 2023-11-11 NOTE — Progress Notes (Signed)
 [facility]  ROUNDING NOTE   Subjective:   Patient seen and evaluated during dialysis   HEMODIALYSIS FLOWSHEET:  Blood Flow Rate (mL/min): 299 mL/min Arterial Pressure (mmHg): -109.29 mmHg Venous Pressure (mmHg): 127.06 mmHg TMP (mmHg): 9.09 mmHg Ultrafiltration Rate (mL/min): 342 mL/min Dialysate Flow Rate (mL/min): 299 ml/min  Tolerating treatment well  Objective:  Vital signs in last 24 hours:  Temp:  [98.3 F (36.8 C)-98.7 F (37.1 C)] 98.3 F (36.8 C) (10/04 0740) Pulse Rate:  [64-118] 65 (10/04 1030) Resp:  [16-26] 18 (10/04 1030) BP: (101-147)/(51-76) 126/69 (10/04 1030) SpO2:  [94 %-100 %] 99 % (10/04 1030) Weight:  [85.5 kg] 85.5 kg (10/04 0753)  Weight change: 0.3 kg Filed Weights   11/10/23 1030 11/11/23 0740 11/11/23 0753  Weight: 90 kg 85.5 kg 85.5 kg    Intake/Output: I/O last 3 completed shifts: In: 1129.2 [P.O.:780; I.V.:34.2; Blood:315] Out: 1050 [Urine:1050]   Intake/Output this shift:  No intake/output data recorded.  Physical Exam: General: NAD  Head: Normocephalic, atraumatic. Moist oral mucosal membranes  Eyes: Anicteric  Lungs:  Clear to auscultation, normal effort  Heart: Regular rate and rhythm  Abdomen:  Soft, nontender  Extremities:  No peripheral edema.  Neurologic: Awake, alert, conversant  Skin: Warm,dry, no rash       Basic Metabolic Panel: Recent Labs  Lab 11/05/23 0317 11/06/23 0441 11/06/23 1442 11/07/23 0525 11/08/23 0454 11/09/23 0449 11/10/23 0516 11/11/23 0422  NA 139 135   < > 134* 135 130* 135 132*  K 3.1* 3.9   < > 4.1 3.8 4.1 4.0 3.8  CL 102 102   < > 98 98 95* 98 95*  CO2 23 23   < > 24 23 21* 26 26  GLUCOSE 103* 149*   < > 176* 99 227* 167* 182*  BUN 54* 65*   < > 80* 82* 97* 70* 48*  CREATININE 2.84* 4.10*   < > 4.96* 5.20* 5.37* 4.53* 3.38*  CALCIUM  7.9* 7.8*   < > 8.1* 8.1* 7.8* 7.9* 7.7*  MG 2.2 2.3  --  2.7*  --  3.1*  --   --   PHOS 4.3 5.1*  --  5.2*  --   --   --   --    < >  = values in this interval not displayed.    Liver Function Tests: Recent Labs  Lab 11/05/23 0317 11/06/23 0441  AST 60* 56*  ALT 26 31  ALKPHOS 65 82  BILITOT 0.7 0.5  PROT 5.6* 5.5*  ALBUMIN 2.1* 1.9*   Recent Labs  Lab 11/05/23 0317  LIPASE 21   No results for input(s): AMMONIA in the last 168 hours.  CBC: Recent Labs  Lab 11/07/23 0525 11/08/23 0454 11/09/23 0449 11/10/23 0516 11/10/23 1749 11/11/23 0422  WBC 7.1 4.7 5.0 4.9  --  5.6  HGB 9.0* 9.3* 8.7* 7.8* 9.7* 8.7*  HCT 27.7* 28.3* 26.2* 22.8* 28.5* 25.6*  MCV 87.1 85.8 85.1 84.4  --  84.2  PLT 188 195 180 190  --  191    Cardiac Enzymes: No results for input(s): CKTOTAL, CKMB, CKMBINDEX, TROPONINI in the last 168 hours.  BNP: Invalid input(s): POCBNP  CBG: Recent Labs  Lab 11/10/23 0716 11/10/23 1052 11/10/23 1602 11/10/23 2215 11/11/23 0827  GLUCAP 178* 124* 178* 220* 172*    Microbiology: Results for orders placed or performed during the hospital encounter of 11/02/23  MRSA Next Gen by PCR, Nasal     Status:  Abnormal   Collection Time: 11/03/23  6:15 AM   Specimen: Nasal Mucosa; Nasal Swab  Result Value Ref Range Status   MRSA by PCR Next Gen DETECTED (A) NOT DETECTED Final    Comment: RESULT CALLED TO, READ BACK BY AND VERIFIED WITH: HANNA SPROUT @0732  11/03/23 MJU (NOTE) The GeneXpert MRSA Assay (FDA approved for NASAL specimens only), is one component of a comprehensive MRSA colonization surveillance program. It is not intended to diagnose MRSA infection nor to guide or monitor treatment for MRSA infections. Test performance is not FDA approved in patients less than 39 years old. Performed at [facility], [address], [place], [place] [address]   Urine Culture (for pregnant, neutropenic or urologic patients or patients with an indwelling urinary catheter)     Status: Abnormal   Collection Time: 11/07/23 12:29 PM   Specimen: Urine, Clean Catch  Result  Value Ref Range Status   Specimen Description   Final    URINE, CLEAN CATCH Performed at [facility], [address], [place], [place] [address]    Special Requests   Final    NONE Performed at [facility], [address] [place], [place] [address]    Culture >=100,000 COLONIES/mL ENTEROCOCCUS FAECALIS (A)  Final   Report Status 11/09/2023 FINAL  Final   Organism ID, Bacteria ENTEROCOCCUS FAECALIS (A)  Final      Susceptibility   Enterococcus faecalis - MIC*    AMPICILLIN  <=2 SENSITIVE Sensitive     NITROFURANTOIN <=16 SENSITIVE Sensitive     VANCOMYCIN  2 SENSITIVE Sensitive     * >=100,000 COLONIES/mL ENTEROCOCCUS FAECALIS    Coagulation Studies: Recent Labs    11/09/23 1413  LABPROT 17.1*  INR 1.3*    Urinalysis: No results for input(s): COLORURINE, LABSPEC, PHURINE, GLUCOSEU, HGBUR, BILIRUBINUR, KETONESUR, PROTEINUR, UROBILINOGEN, NITRITE, LEUKOCYTESUR in the last 72 hours.  Invalid input(s): APPERANCEUR     Imaging: DG Chest Port 1 View Result Date: 11/09/2023 CLINICAL DATA:  Central line placement. EXAM: PORTABLE CHEST 1 VIEW COMPARISON:  November 05, 2023. FINDINGS: Stable cardiomegaly with central pulmonary vascular congestion. Interval placement of left internal jugular catheter with distal tip in expected position of the SVC. No pneumothorax is noted. IMPRESSION: Interval placement of left internal jugular catheter with distal tip in expected position of the SVC. Electronically Signed   By: Lynwood Landy Raddle M.D.   On: 11/09/2023 16:17     Medications:    promethazine  (PHENERGAN ) injection (IM or IVPB) Stopped (11/04/23 1639)    amoxicillin   500 mg Oral Q12H   aspirin  81 mg Oral Daily   Chlorhexidine  Gluconate Cloth  6 each Topical Daily   feeding supplement (GLUCERNA SHAKE)  237 mL Oral TID BM   heparin  injection (subcutaneous)  5,000 Units Subcutaneous Q8H   insulin  aspart  0-15 Units Subcutaneous TID WC    insulin  aspart  0-5 Units Subcutaneous QHS   insulin  aspart  5 Units Subcutaneous TID WC   insulin  glargine  25 Units Subcutaneous Daily   lidocaine   2 patch Transdermal STAT   melatonin  5 mg Oral QHS   metoprolol  succinate  25 mg Oral Daily   multivitamin  1 tablet Oral QHS   multivitamin with minerals  1 tablet Oral Daily   pantoprazole   40 mg Oral BID   polyethylene glycol  17 g Oral BID   prasugrel  10 mg Oral Daily   rosuvastatin   20 mg Oral Daily   senna  2 tablet Oral BID   sodium chloride  flush  3 mL Intravenous Q12H   tamsulosin   0.4 mg Oral BID   thiamine  100 mg Oral Daily   traZODone  100 mg Oral QHS   Vitamin D  (Ergocalciferol )  50,000 Units Oral Q7 days   acetaminophen , alum & mag hydroxide-simeth, bisacodyl, HYDROmorphone  (DILAUDID ) injection, lactulose, ondansetron  (ZOFRAN ) IV, mouth rinse, promethazine  (PHENERGAN ) injection (IM or IVPB), sodium chloride  flush, traMADol   Assessment/ Plan:  [name] is a 58 y.o.  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  Baseline creatinine appears to be 2.85 with GFR 25 on 08/20/2023.  Acute kidney injury likely multifactorial from IV contrast exposure, dehydration, and diuresis.  Creatinine 3.29 on ED arrival.  Diuretics have been held.  IV contrast exposure on 9/27.   Patient was agreeable to proceed with renal replacement therapy.  Appreciate critical care team placing HD temp cath.  Tolerating dialysis well. Receiving third treatment today. Next treatment scheduled for Tuesday. Will continue to monitor for renal recovery  Lab Results  Component Value Date   CREATININE 3.38 (H) 11/11/2023   CREATININE 4.53 (H) 11/10/2023   CREATININE 5.37  (H) 11/09/2023    Intake/Output Summary (Last 24 hours) at 11/11/2023 1052 Last data filed at 11/11/2023 0300 Gross per 24 hour  Intake 1129.17 ml  Output 850 ml  Net 279.17 ml   2.  Hypokalemia/hyponatremia.  Likely secondary to kidney injury.  Sodium 132 today. Potassium remains stable   3.  Hypertension with chronic kidney disease.  Currently receiving with metoprolol  only.  Blood pressure 125/74 during dialysis.   4. Diabetes mellitus type II with chronic kidney disease/renal manifestations: insulin  dependent. Home regimen includes Humalog  75/25 pin. Most recent hemoglobin A1c is 11.0 on 08/12/2023.   SSI managed by primary team    LOS: 8 Raia Amico 10/4/202510:52 AM

## 2023-11-11 NOTE — Progress Notes (Signed)
 Pt transferred to room 237. Report given to [facility].

## 2023-11-11 NOTE — Progress Notes (Signed)
 PROGRESS NOTE    Jason Jennings   FMW:[id number] DOB: 1966/01/26  DOA: 11/02/2023 Date of Service: 11/11/23 which is hospital day 8  PCP: Salli Amato, MD    Hospital course / significant events:   HPI: Jason Jennings is a 58 y.o. 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   He presented with 3 days of nausea, nonbloody, nonbilious, non-coffee-ground vomiting and left upper abdominal pain .  Denies fever or chills, cough or chest pain or change in bowel habits.  Emesis is nonbloody and nonbilious.  Has a history of pancreatitis about 12 years prior.  Denies alcohol use.   09/25: to ED. (+)DKA.  09/26: on insulin  gtt 09/27: (+)STEMI -  of note, EKG/troponin not done in ED and suspect late presentation. Taken for cath/PCI w/ stent to LAD. TTE LVEF 20 to 25%, global hypokinesis, mild to moderate TR. DC'd IV fluid, careful diuresis w/ renal fxn. Urinary retention w/ bladder scan >400 ml. Foley catheter was inserted, 675 ml urine was collected 09/28: L hand numbness, MRI brain no CVA.  09/29: creatinine 4.1, elevated, Discontinued IV Lasix today 09/30: sCr 4.96, elevated, consulted nephrology  10/01: into new Afib RVR but converted back to sinus on amio, will leave on amio gtt for now. D/w cardiology - expect some persistent ST elevation given severity of MI, if chest pain recurrence repeat 12-lead EKG and alert cardiology team.  10/02: amio gtt dc d/t bradycardia. Doing better this morning. Planning to start dialysis tomorrow  10/03: giving 1 unit PRBC. HD#1 today 10/04: tired following dialysis     Consultants:  Cardiology  Heart Failure team  Nephrology    Procedures/Surgeries: Cardiac cath w/ PCI  Temporary HD cath insertion       ASSESSMENT & PLAN:   STEMI found to have EKG changes on 9/27, likely late presenting STEMI in setting of ongoing ACS likely w/ MI since arrival, atypical presentation with abdominal pain and nausea/vomiting.  Findings were masked by diabetic ketoacidosis and slightly elevated lipase. EKG and Trop were not done in the ED on admission. S/p LAD stent DAPT at least 12 months. Aspirin 81 mg p.o. daily, prasugrel 10 mg p.o. daily If/when medical issues and renal function improved, consider proceeding with RCA PCI.   See below re: other cardiac problems, CHF expect some persistent ST elevation given severity of MI, if chest pain recurrence repeat 12-lead EKG and alert cardiology team.  Also expect troponin levels will be high / may not be clinically helpful, defer repeat troponin to cardiology.    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 TTE: LVEF 20 to 25%, global hypokinesis, mild to moderate TR Caution w/ diuresis and GDMT given higher MAP goal w/ renal fxn, renal fxn itself also impacting Rx  Cardiology managing medications    Acute renal failure superimposed on stage 3b chronic kidney disease Dialysis (hopefully temporary) 10/03 started  Nephrology  following for dialysis    Diabetic ketoacidosis without coma associated with type 2 diabetes mellitus - DKA resolved w/ tx  Lantus  + NovoLog  sliding scale Monitor CBG, continue diabetic diet   Acute pancreatitis Elevated lipase (question reactive) Intractable nausea and vomiting due to pancreatitis vs DKA vs  gastroparesis. Resolved  PPI Po hydration     Pseudhyponatremia d/t hyperglycemia, correct to hyponatremia milder, likely d/t CHF, renal disease  Monitor BMP Correct sodium for glucose as needed Address hyponatremia based on underlying cause(s) see CHF   Hypokalemia:  Replete cautiously due to renal failure Hypophosphatemia: repleted orally Check electrolytes daily   Intermittent urinary retention 9/27 bladder scan >400 ml Foley catheter was inserted, 675 ml urine was collected Continue Fnow out, urinating spontaneously Flomax  0.4 mg p.o. 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   Vitamin D  insufficiency:  vitamin D  50,000 units p.o. weekly, follow with PCP to repeat vitamin D  level after 3 to 6 months.   Constipation,  Laxatives w/ senna added lactulose     Class 1 obesity based on BMI: Body mass index is 31.31 kg/m.[name] Significantly low or high BMI is associated with higher medical risk.  Underweight - under 18  overweight - 25 to 29 obese - 30 or more Class 1 obesity: BMI of 30.0 to 34 Class 2 obesity: BMI of 35.0 to 39 Class 3 obesity: BMI of 40.0 to 49 Super Morbid Obesity: BMI 50-59 Super-super Morbid Obesity: BMI 60+ Healthy nutrition and physical activity advised as adjunct to other disease management and risk reduction treatments    DVT prophylaxis: heparin  IV fluids: no continuous IV fluids  Nutrition: cardiac Central lines / other devices: colostomy, foley - removing foley today   Code Status: FULL CODE ACP documentation reviewed:  none on file in [facility]  TOC needs: TBD expect will need rehab Medical barriers to dispo: needing dialysis. Expected medical readiness for discharge several more days / pending need for possible permanent  dialysis              Subjective / Brief ROS:  Patient reports feeling tired w/ dialysis Examined in HD suite on AM rounds  Denies CP/SOB.  Pain controlled.  Denies new weakness.  Tolerating diet.    Family Communication: none at this time     Objective Findings:  Vitals:   11/11/23 1200 11/11/23 1203 11/11/23 1300 11/11/23 1453  BP: (!) 142/77  (!) 122/58 128/74  Pulse: 88 75 82 75  Resp: 18     Temp: 98.2 F (36.8 C)   98.3 F (36.8 C)  TempSrc: Oral   Oral  SpO2: 97% 97% 96% 95%  Weight:      Height:        Intake/Output Summary (Last 24 hours) at 11/11/2023 1511 Last data filed at 11/11/2023 1240 Gross per 24 hour  Intake 738 ml  Output 1150 ml  Net -412 ml   Filed Weights   11/11/23 0740 11/11/23 0753 11/11/23 1118  Weight: 85.5 kg 85.5 kg 85 kg    Examination:  Physical Exam Constitutional:      Appearance: He is ill-appearing.  Cardiovascular:     Rate and Rhythm: Normal rate and regular rhythm.  Pulmonary:     Effort: Pulmonary effort is normal. No respiratory distress.  Abdominal:     General: Bowel sounds are normal.  Musculoskeletal:     Right lower leg: No edema.     Left lower leg: No edema.  Skin:    General: Skin is warm.     Coloration: Skin is pale.  Neurological:     General: No focal deficit present.     Mental Status: He is alert and oriented to person, place, and time. Mental status is at baseline.  Psychiatric:        Mood and Affect: Mood normal.        Behavior: Behavior normal.          Scheduled Medications:   amoxicillin   500 mg Oral Q12H   aspirin  81 mg Oral Daily   Chlorhexidine  Gluconate Cloth  6 each Topical Daily   feeding supplement (GLUCERNA SHAKE)  237 mL Oral TID BM   heparin  injection (subcutaneous)  5,000 Units Subcutaneous Q8H   insulin  aspart  0-15 Units Subcutaneous TID WC   insulin  aspart  0-5 Units Subcutaneous QHS   insulin  aspart  5 Units Subcutaneous TID WC   insulin  glargine  25  Units Subcutaneous Daily   lidocaine   2 patch Transdermal STAT   melatonin  5 mg Oral QHS   metoprolol  succinate  25 mg Oral Daily   multivitamin  1 tablet Oral QHS   multivitamin with minerals  1 tablet Oral Daily   pantoprazole   40 mg Oral BID   polyethylene glycol  17 g Oral BID   prasugrel  10 mg Oral Daily   rosuvastatin   20 mg Oral Daily   senna  2 tablet Oral BID   sodium chloride  flush  3 mL Intravenous Q12H   tamsulosin   0.4 mg Oral BID   thiamine  100 mg Oral Daily   traZODone  100 mg Oral QHS   Vitamin D  (Ergocalciferol )  50,000 Units Oral Q7 days    Continuous Infusions:  promethazine  (PHENERGAN ) injection (IM or IVPB) Stopped (11/04/23 1639)    PRN Medications:  acetaminophen , alum & mag hydroxide-simeth, bisacodyl, HYDROmorphone  (DILAUDID ) injection, lactulose, ondansetron  (ZOFRAN ) IV, mouth rinse, promethazine  (PHENERGAN ) injection (IM or IVPB), sodium chloride  flush, traMADol   Antimicrobials from admission:  Anti-infectives (From admission, onward)    Start     Dose/Rate Route Frequency Ordered Stop   11/09/23 1445  amoxicillin  (AMOXIL ) capsule 500 mg  Status:  Discontinued        500 mg Oral Every 12 hours 11/09/23 1350 11/09/23 1351   11/09/23 1445  amoxicillin  (AMOXIL ) capsule 500 mg        500 mg Oral Every 12 hours 11/09/23 1351 11/14/23 0959   11/07/23 1500  cefTRIAXone  (ROCEPHIN ) 1 g in sodium chloride  0.9 % 100 mL IVPB  Status:  Discontinued        1 g 200 mL/hr over 30 Minutes Intravenous Every 24 hours 11/07/23 1410 11/09/23 1350           Data Reviewed:  I have personally reviewed the following...  CBC: Recent Labs  Lab 11/07/23 0525 11/08/23 0454 11/09/23 0449 11/10/23 0516 11/10/23 1749 11/11/23 0422  WBC 7.1 4.7 5.0 4.9  --  5.6  HGB 9.0* 9.3* 8.7* 7.8* 9.7* 8.7*  HCT 27.7* 28.3* 26.2* 22.8* 28.5* 25.6*  MCV 87.1 85.8 85.1 84.4  --  84.2  PLT 188 195 180 190  --  191   Basic Metabolic Panel: Recent Labs  Lab 11/05/23 0317  11/06/23 0441 11/06/23 1442 11/07/23 0525  11/08/23 0454 11/09/23 0449 11/10/23 0516 11/11/23 0422  NA 139 135   < > 134* 135 130* 135 132*  K 3.1* 3.9   < > 4.1 3.8 4.1 4.0 3.8  CL 102 102   < > 98 98 95* 98 95*  CO2 23 23   < > 24 23 21* 26 26  GLUCOSE 103* 149*   < > 176* 99 227* 167* 182*  BUN 54* 65*   < > 80* 82* 97* 70* 48*  CREATININE 2.84* 4.10*   < > 4.96* 5.20* 5.37* 4.53* 3.38*  CALCIUM  7.9* 7.8*   < > 8.1* 8.1* 7.8* 7.9* 7.7*  MG 2.2 2.3  --  2.7*  --  3.1*  --   --   PHOS 4.3 5.1*  --  5.2*  --   --   --   --    < > = values in this interval not displayed.   GFR: Estimated Creatinine Clearance: 24.1 mL/min (A) (by C-G formula based on SCr of 3.38 mg/dL (H)). Liver Function Tests: Recent Labs  Lab 11/05/23 0317 11/06/23 0441  AST 60* 56*  ALT 26 31  ALKPHOS 65 82  BILITOT 0.7 0.5  PROT 5.6* 5.5*  ALBUMIN 2.1* 1.9*   Recent Labs  Lab 11/05/23 0317  LIPASE 21   No results for input(s): AMMONIA in the last 168 hours. Coagulation Profile: Recent Labs  Lab 11/09/23 1413  INR 1.3*   Cardiac Enzymes: No results for input(s): CKTOTAL, CKMB, CKMBINDEX, TROPONINI in the last 168 hours. BNP (last 3 results) No results for input(s): PROBNP in the last 8760 hours. HbA1C: No results for input(s): HGBA1C in the last 72 hours. CBG: Recent Labs  Lab 11/10/23 1052 11/10/23 1602 11/10/23 2215 11/11/23 0827 11/11/23 1159  GLUCAP 124* 178* 220* 172* 135*   Lipid Profile: No results for input(s): CHOL, HDL, LDLCALC, TRIG, CHOLHDL, LDLDIRECT in the last 72 hours. Thyroid Function Tests: No results for input(s): TSH, T4TOTAL, FREET4, T3FREE, THYROIDAB in the last 72 hours. Anemia Panel: No results for input(s): VITAMINB12, FOLATE, FERRITIN, TIBC, IRON, RETICCTPCT in the last 72 hours.  Most Recent Urinalysis On File:     Component Value Date/Time   COLORURINE YELLOW (A) 11/07/2023 1229   APPEARANCEUR TURBID  (A) 11/07/2023 1229   LABSPEC 1.017 11/07/2023 1229   PHURINE 5.0 11/07/2023 1229   GLUCOSEU 150 (A) 11/07/2023 1229   HGBUR MODERATE (A) 11/07/2023 1229   BILIRUBINUR NEGATIVE 11/07/2023 1229   KETONESUR NEGATIVE 11/07/2023 1229   PROTEINUR >=300 (A) 11/07/2023 1229   NITRITE NEGATIVE 11/07/2023 1229   LEUKOCYTESUR LARGE (A) 11/07/2023 1229   Sepsis Labs: @LABRCNTIP (procalcitonin:4,lacticidven:4) Microbiology: Recent Results (from the past 240 hours)  MRSA Next Gen by PCR, Nasal     Status: Abnormal   Collection Time: 11/03/23  6:15 AM   Specimen: Nasal Mucosa; Nasal Swab  Result Value Ref Range Status   MRSA by PCR Next Gen DETECTED (A) NOT DETECTED Final    Comment: RESULT CALLED TO, READ BACK BY AND VERIFIED WITH: HANNA SPROUT @0732  11/03/23 MJU (NOTE) The GeneXpert MRSA Assay (FDA approved for NASAL specimens only), is one component of a comprehensive MRSA colonization surveillance program. It is not intended to diagnose MRSA infection nor to guide or monitor treatment for MRSA infections. Test performance is not FDA approved in patients less than 51 years old. Performed at [facility], [address], [place], [place] [address]   Urine Culture (for pregnant, neutropenic or  urologic patients or patients with an indwelling urinary catheter)     Status: Abnormal   Collection Time: 11/07/23 12:29 PM   Specimen: Urine, Clean Catch  Result Value Ref Range Status   Specimen Description   Final    URINE, CLEAN CATCH Performed at [facility], [address] [place], [place] [address]    Special Requests   Final    NONE Performed at [facility], [address] [place], [place] [address]    Culture >=100,000 COLONIES/mL ENTEROCOCCUS FAECALIS (A)  Final   Report Status 11/09/2023 FINAL  Final   Organism ID, Bacteria ENTEROCOCCUS FAECALIS (A)  Final      Susceptibility   Enterococcus faecalis - MIC*    AMPICILLIN  <=2 SENSITIVE Sensitive      NITROFURANTOIN <=16 SENSITIVE Sensitive     VANCOMYCIN  2 SENSITIVE Sensitive     * >=100,000 COLONIES/mL ENTEROCOCCUS FAECALIS      Radiology Studies last 3 days: DG Chest Port 1 View Result Date: 11/09/2023 CLINICAL DATA:  Central line placement. EXAM: PORTABLE CHEST 1 VIEW COMPARISON:  November 05, 2023. FINDINGS: Stable cardiomegaly with central pulmonary vascular congestion. Interval placement of left internal jugular catheter with distal tip in expected position of the SVC. No pneumothorax is noted. IMPRESSION: Interval placement of left internal jugular catheter with distal tip in expected position of the SVC. Electronically Signed   By: Lynwood Landy Raddle M.D.   On: 11/09/2023 16:17        Time spent: 35 min   Additional critical care time this afternoon w/ Afib RVR episode   CRITICAL CARE Performed by: Laneta Blunt   Total critical care time: 45 minutes  Critical care time was exclusive of separately billable procedures and treating other patients.  Critical care was necessary to treat or prevent imminent or life-threatening deterioration.  Critical care was time spent personally by me on the following activities: development of treatment plan with patient and/or surrogate as well as nursing, discussions with consultants, evaluation of patient's response to treatment, examination of patient, obtaining history from patient or surrogate, ordering and performing treatments and interventions, ordering and review of laboratory studies, ordering and review of radiographic studies, pulse oximetry and re-evaluation of patient's condition.     Thang Flett, DO [facility] 11/11/2023, 3:11 PM    Dictation software may have been used to generate the above note. Typos may occur and escape review in typed/dictated notes. Please contact Dr Blunt directly for clarity if needed.  Staff may message me via secure chat in Epic  but this may not receive an immediate  response,  please page me for urgent matters!  If 7PM-7AM, please contact night coverage [email or web address]

## 2023-11-11 NOTE — Progress Notes (Signed)
 500 ml ultrafiltration, post hemodialysis condition stable, and report given to the primary RN.

## 2023-11-12 ENCOUNTER — Encounter: Admission: EM | Disposition: E | Payer: Self-pay | Source: Home / Self Care | Attending: Student

## 2023-11-12 ENCOUNTER — Inpatient Hospital Stay

## 2023-11-12 DIAGNOSIS — R570 Cardiogenic shock: Secondary | ICD-10-CM

## 2023-11-12 DIAGNOSIS — R7989 Other specified abnormal findings of blood chemistry: Secondary | ICD-10-CM

## 2023-11-12 DIAGNOSIS — J9601 Acute respiratory failure with hypoxia: Secondary | ICD-10-CM

## 2023-11-12 DIAGNOSIS — N183 Chronic kidney disease, stage 3 unspecified: Secondary | ICD-10-CM

## 2023-11-12 DIAGNOSIS — R7401 Elevation of levels of liver transaminase levels: Secondary | ICD-10-CM

## 2023-11-12 DIAGNOSIS — I469 Cardiac arrest, cause unspecified: Secondary | ICD-10-CM

## 2023-11-12 HISTORY — PX: LEFT HEART CATH AND CORONARY ANGIOGRAPHY: CATH118249

## 2023-11-12 HISTORY — PX: CORONARY/GRAFT ACUTE MI REVASCULARIZATION: CATH118305

## 2023-11-12 LAB — CBC WITH DIFFERENTIAL/PLATELET
Abs Immature Granulocytes: 0.94 K/uL — ABNORMAL HIGH (ref 0.00–0.07)
Basophils Absolute: 0.1 K/uL (ref 0.0–0.1)
Basophils Relative: 1 %
Eosinophils Absolute: 0 K/uL (ref 0.0–0.5)
Eosinophils Relative: 0 %
HCT: 31.1 % — ABNORMAL LOW (ref 39.0–52.0)
Hemoglobin: 10.1 g/dL — ABNORMAL LOW (ref 13.0–17.0)
Immature Granulocytes: 19 %
Lymphocytes Relative: 35 %
Lymphs Abs: 1.7 K/uL (ref 0.7–4.0)
MCH: 28.7 pg (ref 26.0–34.0)
MCHC: 32.5 g/dL (ref 30.0–36.0)
MCV: 88.4 fL (ref 80.0–100.0)
Monocytes Absolute: 0.1 K/uL (ref 0.1–1.0)
Monocytes Relative: 2 %
Neutro Abs: 2.1 K/uL (ref 1.7–7.7)
Neutrophils Relative %: 43 %
Platelets: 234 K/uL (ref 150–400)
RBC: 3.52 MIL/uL — ABNORMAL LOW (ref 4.22–5.81)
RDW: 16.2 % — ABNORMAL HIGH (ref 11.5–15.5)
Smear Review: NORMAL
WBC: 4.8 K/uL (ref 4.0–10.5)
nRBC: 1.9 % — ABNORMAL HIGH (ref 0.0–0.2)

## 2023-11-12 LAB — PHOSPHORUS: Phosphorus: 3.6 mg/dL (ref 2.5–4.6)

## 2023-11-12 LAB — COMPREHENSIVE METABOLIC PANEL WITH GFR
ALT: 74 U/L — ABNORMAL HIGH (ref 0–44)
AST: 73 U/L — ABNORMAL HIGH (ref 15–41)
Albumin: 1.8 g/dL — ABNORMAL LOW (ref 3.5–5.0)
Alkaline Phosphatase: 149 U/L — ABNORMAL HIGH (ref 38–126)
Anion gap: 19 — ABNORMAL HIGH (ref 5–15)
BUN: 34 mg/dL — ABNORMAL HIGH (ref 6–20)
CO2: 21 mmol/L — ABNORMAL LOW (ref 22–32)
Calcium: 8.2 mg/dL — ABNORMAL LOW (ref 8.9–10.3)
Chloride: 98 mmol/L (ref 98–111)
Creatinine, Ser: 2.8 mg/dL — ABNORMAL HIGH (ref 0.61–1.24)
GFR, Estimated: 25 mL/min — ABNORMAL LOW (ref >60)
Glucose, Bld: 257 mg/dL — ABNORMAL HIGH (ref 70–99)
Potassium: 3 mmol/L — ABNORMAL LOW (ref 3.5–5.1)
Sodium: 138 mmol/L (ref 135–145)
Total Bilirubin: 0.5 mg/dL (ref 0.0–1.2)
Total Protein: 5.4 g/dL — ABNORMAL LOW (ref 6.5–8.1)

## 2023-11-12 LAB — LACTIC ACID, PLASMA
Lactic Acid, Venous: 3.1 mmol/L (ref 0.5–1.9)
Lactic Acid, Venous: 1.4 mmol/L (ref 0.5–1.9)

## 2023-11-12 LAB — BLOOD GAS, ARTERIAL
Acid-Base Excess: 2 mmol/L (ref 0.0–2.0)
Bicarbonate: 26.5 mmol/L (ref 20.0–28.0)
FIO2: 100 %
MECHVT: 500 mL
Mechanical Rate: 18
O2 Saturation: 99.2 %
PEEP: 5 cmH2O
Patient temperature: 37
pCO2 arterial: 40 mmHg (ref 32–48)
pH, Arterial: 7.43 (ref 7.35–7.45)
pO2, Arterial: 120 mmHg — ABNORMAL HIGH (ref 83–108)

## 2023-11-12 LAB — MAGNESIUM: Magnesium: 4.2 mg/dL — ABNORMAL HIGH (ref 1.7–2.4)

## 2023-11-12 LAB — TROPONIN I (HIGH SENSITIVITY)
Troponin I (High Sensitivity): 9908 ng/L (ref <18)
Troponin I (High Sensitivity): 10087 ng/L (ref <18)

## 2023-11-12 LAB — PROTIME-INR
INR: 1.2 (ref 0.8–1.2)
Prothrombin Time: 15.9 s — ABNORMAL HIGH (ref 11.4–15.2)

## 2023-11-12 LAB — GLUCOSE, CAPILLARY: Glucose-Capillary: 142 mg/dL — ABNORMAL HIGH (ref 70–99)

## 2023-11-12 LAB — PROCALCITONIN: Procalcitonin: 0.26 ng/mL

## 2023-11-12 SURGERY — CORONARY/GRAFT ACUTE MI REVASCULARIZATION
Anesthesia: Moderate Sedation

## 2023-11-12 MED ORDER — IOHEXOL 300 MG/ML  SOLN
INTRAMUSCULAR | Status: DC | PRN
  Administered 2023-11-12: 52 mL

## 2023-11-12 MED ORDER — ORAL CARE MOUTH RINSE: 15.0000 mL | OROMUCOSAL | Status: DC

## 2023-11-12 MED ORDER — LIDOCAINE HCL 1 % IJ SOLN
INTRAMUSCULAR | Status: AC
  Filled 2023-11-12: qty 20

## 2023-11-12 MED ORDER — AMIODARONE HCL IN DEXTROSE 360-4.14 MG/200ML-% IV SOLN
60.0000 mg/h | INTRAVENOUS | Status: DC
  Administered 2023-11-12: 60 mg/h via INTRAVENOUS

## 2023-11-12 MED ORDER — VERAPAMIL HCL 2.5 MG/ML IV SOLN
INTRAVENOUS | Status: DC | PRN
  Administered 2023-11-12: 2.5 mg via INTRAVENOUS

## 2023-11-12 MED ORDER — PROPOFOL 1000 MG/100ML IV EMUL
0.0000 ug/kg/min | INTRAVENOUS | Status: DC
  Administered 2023-11-12: 10 ug/kg/min via INTRAVENOUS
  Filled 2023-11-12 (×2): qty 100

## 2023-11-12 MED ORDER — AMIODARONE HCL IN DEXTROSE 360-4.14 MG/200ML-% IV SOLN
60.0000 mg/h | INTRAVENOUS | Status: DC
  Filled 2023-11-12: qty 200

## 2023-11-12 MED ORDER — AMIODARONE HCL IN DEXTROSE 360-4.14 MG/200ML-% IV SOLN: 30.0000 mg/h | INTRAVENOUS | Status: DC

## 2023-11-12 MED ORDER — HEPARIN (PORCINE) IN NACL 1000-0.9 UT/500ML-% IV SOLN
INTRAVENOUS | Status: DC | PRN
  Administered 2023-11-12 (×2): 500 mL

## 2023-11-12 MED ORDER — VERAPAMIL HCL 2.5 MG/ML IV SOLN
INTRAVENOUS | Status: AC
  Filled 2023-11-12: qty 2

## 2023-11-12 MED ORDER — ROCURONIUM BROMIDE 10 MG/ML (PF) SYRINGE
PREFILLED_SYRINGE | INTRAVENOUS | Status: AC
  Filled 2023-11-12: qty 10

## 2023-11-12 MED ORDER — POTASSIUM CHLORIDE 10 MEQ/100ML IV SOLN
10.0000 meq | INTRAVENOUS | Status: DC
  Filled 2023-11-12 (×3): qty 100

## 2023-11-12 MED ORDER — NOREPINEPHRINE 4 MG/250ML-% IV SOLN
INTRAVENOUS | Status: AC
  Administered 2023-11-12: 4 mg
  Filled 2023-11-12: qty 250

## 2023-11-12 MED ORDER — FENTANYL CITRATE (PF) 100 MCG/2ML IJ SOLN
INTRAMUSCULAR | Status: AC
  Filled 2023-11-12: qty 2

## 2023-11-12 MED ORDER — SODIUM CHLORIDE 0.9 % IV SOLN
250.0000 mL | INTRAVENOUS | Status: DC | PRN
Start: 2023-11-12 — End: 2023-11-12

## 2023-11-12 MED ORDER — SODIUM CHLORIDE 0.9% FLUSH: 3.0000 mL | INTRAVENOUS | Status: DC | PRN

## 2023-11-12 MED ORDER — ETOMIDATE 2 MG/ML IV SOLN
INTRAVENOUS | Status: AC
  Filled 2023-11-12: qty 10

## 2023-11-12 MED ORDER — LIDOCAINE HCL (PF) 1 % IJ SOLN
INTRAMUSCULAR | Status: DC | PRN
  Administered 2023-11-12: 2 mL

## 2023-11-12 MED ORDER — MIDAZOLAM HCL 2 MG/2ML IJ SOLN
INTRAMUSCULAR | Status: AC
  Filled 2023-11-12: qty 2

## 2023-11-12 MED ORDER — HEPARIN SODIUM (PORCINE) 1000 UNIT/ML IJ SOLN
INTRAMUSCULAR | Status: DC | PRN
Start: 2023-11-12 — End: 2023-11-12
  Administered 2023-11-12: 2500 [IU] via INTRAVENOUS

## 2023-11-12 MED ORDER — DOCUSATE SODIUM 50 MG/5ML PO LIQD: 100.0000 mg | Freq: Two times a day (BID) | ORAL | Status: DC

## 2023-11-12 MED ORDER — ORAL CARE MOUTH RINSE: 15.0000 mL | OROMUCOSAL | Status: DC | PRN

## 2023-11-12 MED ORDER — FENTANYL 2500MCG IN NS 250ML (10MCG/ML) PREMIX INFUSION
0.0000 ug/h | INTRAVENOUS | Status: DC
  Administered 2023-11-12: 50 ug/h via INTRAVENOUS
  Filled 2023-11-12: qty 250

## 2023-11-12 MED ORDER — HEPARIN (PORCINE) IN NACL 1000-0.9 UT/500ML-% IV SOLN
INTRAVENOUS | Status: AC
  Filled 2023-11-12: qty 1000

## 2023-11-12 MED ORDER — HEPARIN SODIUM (PORCINE) 1000 UNIT/ML IJ SOLN
INTRAMUSCULAR | Status: AC
  Filled 2023-11-12: qty 10

## 2023-11-12 MED ORDER — SODIUM CHLORIDE 0.9 % IV SOLN: INTRAVENOUS | Status: DC

## 2023-11-12 MED ORDER — SODIUM CHLORIDE 0.9% FLUSH: 3.0000 mL | Freq: Two times a day (BID) | INTRAVENOUS | Status: DC

## 2023-11-12 MED ORDER — FENTANYL BOLUS VIA INFUSION: 25.0000 ug | INTRAVENOUS | Status: DC | PRN

## 2023-11-12 SURGICAL SUPPLY — 11 items
CATH INFINITI 5 FR JL3.5 (CATHETERS) IMPLANT
CATH INFINITI JR4 5F (CATHETERS) IMPLANT
DEVICE RAD TR BAND REGULAR (VASCULAR PRODUCTS) IMPLANT
DRAPE BRACHIAL (DRAPES) IMPLANT
GLIDESHEATH SLEND SS 6F .021 (SHEATH) IMPLANT
GUIDEWIRE INQWIRE 1.5J.035X260 (WIRE) IMPLANT
KIT SYRINGE INJ CVI SPIKEX1 (MISCELLANEOUS) IMPLANT
PACK CARDIAC CATH (CUSTOM PROCEDURE TRAY) ×1 IMPLANT
SET ATX-X65L (MISCELLANEOUS) IMPLANT
STATION PROTECTION PRESSURIZED (MISCELLANEOUS) IMPLANT
TUBING CIL FLEX 10 FLL-RA (TUBING) IMPLANT

## 2023-11-13 ENCOUNTER — Encounter: Payer: Self-pay | Admitting: Internal Medicine

## 2023-11-13 ENCOUNTER — Telehealth: Payer: Self-pay | Admitting: Podiatry

## 2023-11-13 LAB — GLUCOSE, CAPILLARY: Glucose-Capillary: 197 mg/dL — ABNORMAL HIGH (ref 70–99)

## 2023-11-13 NOTE — Telephone Encounter (Signed)
 Patients daughter called in to cancel his appointment and let you know he passed away.

## 2023-11-14 LAB — HELIX PHARMACOGENOMICS (PGX) CLOPIDOGREL TEST

## 2023-12-09 NOTE — Significant Event (Signed)
 Patient c/o shortness of breath. Checked pulse ox. Patient oxygen saturation was 97%. Placed patient on 2L to see if it would help him. Respiratory called for consult. When I returned to room the patient was unresponsive and in Vtach with a HR of 236. Code blue called and CPR started. Administered one defibrillation shock and resumed CPR. Soon after Code team at the bedside. Patient regained consciousness. Medications given and patient intubated. Code STEMI called and patient transferred to ICU.

## 2023-12-09 NOTE — Death Summary Note (Signed)
 DEATH SUMMARY   Patient Details  Name: Jason Jennings MRN: [id number] DOB: 11-Sep-1965  Admission/Discharge Information   Admit Date:  2023/11/07  Date of Death:  Nov 17, 2023   Time of Death:  735AM  Length of Stay: 9  Referring Physician: Salli Amato, MD   Reason(s) for Hospitalization    Diagnoses  Preliminary cause of death:  Secondary Diagnoses (including complications and co-morbidities):  Principal Problem:   Diabetic ketoacidosis without coma associated with type 2 diabetes mellitus (HCC) Active Problems:   Acute renal failure superimposed on stage 3b chronic kidney disease (HCC)   HTN (hypertension)   Anemia of chronic disease   S/P colostomy (HCC)   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)   Acute pancreatitis   Intractable nausea and vomiting   Acute ST elevation myocardial infarction (STEMI) due to occlusion of left anterior descending (LAD) coronary artery (HCC)   Acute systolic heart failure [facility])   Brief Hospital Course (including significant findings, care, treatment, and services provided and events leading to death)   HPI  58 y.o male with significant PMH of of IDDM, CKD stage III with anemia of chronic disease, HTN, HLD, rectal cancer s/p Rectosigmoid resection with colostomy, who presented to the hospital with chief complaints of 3 days of nausea, vomiting and LUQ pain. Patient was recently hospitalized (7/5-7/13/2025) with sepsis secondary to MRSA cellulitis/abscess left foot s/p I&D/antibiotics.    ED Course: tachycardic to 115 with otherwise normal vitals. Pertinent Labs:Lipase 300 with LFTs notable for AST 122 otherwise normal. Glucose 513 with anion gap of 23, bicarb 17, venous pH 7.36 and beta hydroxybutyric acid of 3.74. Creatinine 3.29, up from baseline of 2.27 WBC 14.8 and hemoglobin 11.8(Baseline 10-11)   Patient was admitted to [facility] service on 9/26 with DKA and acute pancreatitis with a lipase of 300.  In addition, he was  noted to have acute on chronic CKD.  Patient was started on IV hydration and insulin  drip per DKA protocol   SEE SIGNIFICANT EVENTS BELOW   Past Medical History  IDDM, CKD stage III with anemia of chronic disease, HTN, HLD, rectal cancer s/p Rectosigmoid resection with colostomy   Significant Hospital Events   9/26: Admitted to [facility] service with acute pancreatitis, DKA and acute AKI on CKD 9/27: Cardiology consulted for late presenting anterior STEMI s/p DES to mid LAD 9/27.  Patient found with new onset acute HFrEF iso ICM EF 20 to 25% 9/28: Patient complained of left hand numbness.  MRI negative for acute finding 9/29: Noted with elevated creatine, plan to consult Nephro 9/30: Nephro consulted no plans for HD 10/01:Patient went into A-fib with RVR with a heart rate 175 resolved with amiodarone. Amio gtt started 10/02:Due to worsening renal function and significant volume overload, started on iHD  per Nephro. Amio gtt stopped due to badycardia 10/03:significant drop in hgb s/p I unit PRBCs. S/p iHD 10/04:post iHD 10/05:Code Blue called for pulseless [facility] Cardiac Arrest, defib x 1 and Amio 300 x 1 with ROSC. Transferred to ICU. PCCM consulted. 2ND Pulseless VTACH cardiac arrest defib 200J x1. STEMI activated and patient taken to cath lab.   Consults:  Cardiology Nephrology   Procedures:  10/5: Endotracheal intubation 10/2: Left IJ central line    Patient with sudden cardiac arrest while on VENT a second CODE, Family at bedside witnessed CPR   GOALS OF CARE DISCUSSION   The Clinical status was relayed to family in detail-   Updated and notified of patients medical  condition- Patient remains unresponsive and will not open eyes to command.   Patient with increased WOB and using accessory muscles to breathe Explained to family course of therapy and the modalities  Patient with Progressive multiorgan failure with a very high probablity of a very minimal chance of meaningful recovery  despite all aggressive and optimal medical therapy.     Family understands the situation.   They have consented and agreed to DNR status   Family are satisfied with Plan of action and management. All questions answered  CASE DISCUSSED WITH CARDIOLOGY ON CALL NO FURTHER INTERVENTIONS AT THIS TIME  PATIENT SUBSEQUENTLY PASSED AWAY PEACEFULLY WHILE FAMILY AT BEDSIDE    Pertinent Labs and Studies  Significant Diagnostic Studies DG Chest 1 View Result Date: 2023-11-24 CLINICAL DATA:  Status post intubation.  NG tube placement. EXAM: CHEST  1 VIEW COMPARISON:  11/09/2023 FINDINGS: Endotracheal tube tip is approximately 2.8 cm above the base of the carina. The patient has NG tube is coiled in the hypopharynx. Left IJ central line tip overlies the proximal SVC level. The cardio pericardial silhouette is enlarged. Bibasilar collapse/consolidation is progressive in the interval with layering bilateral pleural effusions evident. Component of interstitial edema not excluded. Telemetry leads overlie the chest. IMPRESSION: 1. Endotracheal tube tip is approximately 2.8 cm above the base of the carina. 2. NG tube is coiled in the hypopharynx. This should be repositioned and repeat x-ray showed be performed after repositioning to confirm tip placement. 3. Progressive bibasilar collapse/consolidation with layering bilateral pleural effusions. These results will be called to the ordering clinician or representative by the Radiologist Assistant, and communication documented in the PACS or [employer]. Electronically Signed   By: Camellia Candle M.D.   On: 11-24-2023 06:40   DG Abd 1 View Result Date: 2023/11/24 CLINICAL DATA:  OG tube placement. EXAM: ABDOMEN - 1 VIEW COMPARISON:  None. FINDINGS: 0622 hours. No evidence for OG tube over the lower chest or upper abdomen. Visualized abdomen demonstrates nonspecific bowel gas pattern. IMPRESSION: No evidence for OG tube over the lower chest or upper abdomen. See  report for chest x-ray performed at the same time. Electronically Signed   By: Camellia Candle M.D.   On: 11-24-23 06:38   CARDIAC CATHETERIZATION Result Date: Nov 24, 2023   Mid LM to Dist LM lesion is 50% stenosed.   Mid LAD to Dist LAD lesion is 30% stenosed.   Mid Cx to Dist Cx lesion is 40% stenosed.   Mid RCA lesion is 90% stenosed.   Non-stenotic Mid LAD lesion was previously treated.   LV end diastolic pressure is mildly elevated.   Recommend uninterrupted dual antiplatelet therapy with Aspirin 81mg  daily and Prasugrel 10mg  daily for a minimum of 12 months (ACS-Class I recommendation). Code STEMI in-house previous STEMI 927 mid LAD PCI and stent by MA late presenter Severely depressed left ventricular function on echo around 25% anterior apical akinesis Patient had what appeared to be ventricular tachycardia arrest pulseless temporarily Defibrillated CPR regained ROSC given amiodarone back to sinus tach intubated sedated EKG reported persistent ST elevation anteriorly Code STEMI was initiated Patient brought to the cardiac Cath Lab Right radial approach Left heart cath EDP of 14 No LV gram performed Left heart cath showed widely patent mid LAD stent essentially unchanged from previous cath on the 27th RCA unchanged with mid RCA lesion around 90% Intervention deferred Recommend continue amiodarone Initiate heart failure therapy for cardiomyopathy Consider staging RCA at a later more stable time Patient will  probably need consideration with EP possible AICD Continue aspirin and prasugrel Will transfer cardiology service and management back to [facility] in the morning  DG Chest [facility] 1 View Result Date: 11/09/2023 CLINICAL DATA:  Central line placement. EXAM: PORTABLE CHEST 1 VIEW COMPARISON:  November 05, 2023. FINDINGS: Stable cardiomegaly with central pulmonary vascular congestion. Interval placement of left internal jugular catheter with distal tip in expected position of the SVC. No pneumothorax is noted.  IMPRESSION: Interval placement of left internal jugular catheter with distal tip in expected position of the SVC. Electronically Signed   By: Lynwood Landy Raddle M.D.   On: 11/09/2023 16:17   DG Chest Port 1 View Result Date: 11/05/2023 CLINICAL DATA:  Shortness of breath. EXAM: PORTABLE CHEST 1 VIEW COMPARISON:  Lung bases from abdominopelvic CT 11/02/2023 FINDINGS: The heart is enlarged. Small bilateral pleural effusions. Hazy opacity in both lung bases. Opacity abutting the right major fissure may represent a small amount of pleural fluid or airspace disease. Vascular congestion. No pneumothorax. IMPRESSION: 1. Cardiomegaly with vascular congestion and small bilateral pleural effusions. 2. Hazy opacity in both lung bases, favor atelectasis in the setting of pleural effusions. Electronically Signed   By: Andrea Gasman M.D.   On: 11/05/2023 14:06   MR BRAIN WO CONTRAST Result Date: 11/05/2023 EXAM: MRI BRAIN WITHOUT CONTRAST 11/05/2023 12:21:41 PM TECHNIQUE: Multiplanar multisequence MRI of the head/brain was performed without the administration of intravenous contrast. COMPARISON: CT head without contrast 03/27/2023. CLINICAL HISTORY: Neuro deficit, acute, stroke suspected. Eval for possible cva. FINDINGS: BRAIN AND VENTRICLES: No acute infarct. No intracranial hemorrhage. No mass. No midline shift. No hydrocephalus. The sella is unremarkable. Normal flow voids. ORBITS: Bilateral lens replacements are noted. The globes and orbits are otherwise within normal limits. SINUSES AND MASTOIDS: No acute abnormality. BONES AND SOFT TISSUES: Normal marrow signal. No acute soft tissue abnormality. IMPRESSION: 1. No acute intracranial abnormality related to the clinical history of neuro deficit and suspected stroke. Electronically signed by: Lonni Necessary MD 11/05/2023 12:35 PM EDT RP Workstation: [id number]   CARDIAC CATHETERIZATION Result Date: 11/04/2023   Mid RCA lesion is 90% stenosed.   Mid Cx to Dist Cx  lesion is 40% stenosed.   Mid LM to Dist LM lesion is 50% stenosed.   Mid LAD lesion is 100% stenosed.   Mid LAD to Dist LAD lesion is 30% stenosed.   A drug-eluting stent was successfully placed using a STENT ONYX FRONTIER 3.0X34.   Balloon angioplasty was performed using a BALLOON TREK RX 2.5X12.   Post intervention, there is a 0% residual stenosis. 1.  Late presenting anterior STEMI due to thromotic occlusion of the mid LAD.  In addition, there is moderate distal left main stenosis and significant mid RCA stenosis. 2.  Left ventricular angiography was not performed.  EF was severely reduced by echo. 3.  Severely elevated left ventricular end-diastolic pressure at 33 mmHg with no evidence of cardiogenic shock by blood pressure and with normal lactic acid. 4.  Successful angioplasty, aspiration thrombectomy and drug-eluting stent placement to the mid LAD.  IVUS was performed after and showed no clear evidence of edge dissection. Recommendations: Dual antiplatelet therapy for at least 12 months. Will run Aggrastat infusion for 6 hours. Staged RCA PCI is recommended once medical issues and renal function improve. I stopped all IV fluids and will give 1 dose of IV furosemide. The patient already has acute on chronic kidney disease and he is at risk of further worsening of this.  ECHOCARDIOGRAM COMPLETE Result Date: 11/04/2023    ECHOCARDIOGRAM REPORT   Patient Name:   SHANTE MAYSONET Date of Exam: 11/04/2023 Medical Rec #:  [id number]       Height:       66.0 in Accession #:    [id number]      Weight:       201.4 lb Date of Birth:  03-01-1965       BSA:          2.006 m Patient Age:    58 years        BP:           120/96 mmHg Patient Gender: M               HR:           118 bpm. Exam Location:  [facility] Procedure: 2D Echo, Cardiac Doppler, Color Doppler and Intracardiac            Opacification Agent (Both Spectral and Color Flow Doppler were            utilized during procedure). Indications:     Chest Pain R07.9   History:         Patient has no prior history of Echocardiogram examinations.                  Risk Factors:Diabetes.  Sonographer:     Bari Roar Referring Phys:  [id number] ELVAN SOR Diagnosing Phys: Redell Cave MD IMPRESSIONS  1. Left ventricular ejection fraction, by estimation, is 20 to 25%. The left ventricle has severely decreased function. The left ventricle demonstrates global hypokinesis. The left ventricular internal cavity size was mildly dilated. There is mild left ventricular hypertrophy. Left ventricular diastolic parameters are indeterminate.  2. Right ventricular systolic function is normal. The right ventricular size is normal.  3. The mitral valve is degenerative. Mild mitral valve regurgitation.  4. Tricuspid valve regurgitation is mild to moderate.  5. The aortic valve was not well visualized. Aortic valve regurgitation is not visualized.  6. Aortic dilatation noted. There is mild dilatation of the ascending aorta, measuring 39 mm.  7. The inferior vena cava is normal in size with greater than 50% respiratory variability, suggesting right atrial pressure of 3 mmHg. FINDINGS  Left Ventricle: Left ventricular ejection fraction, by estimation, is 20 to 25%. The left ventricle has severely decreased function. The left ventricle demonstrates global hypokinesis. Definity contrast agent was given IV to delineate the left ventricular endocardial borders. The left ventricular internal cavity size was mildly dilated. There is mild left ventricular hypertrophy. Left ventricular diastolic parameters are indeterminate. Right Ventricle: The right ventricular size is normal. No increase in right ventricular wall thickness. Right ventricular systolic function is normal. Left Atrium: Left atrial size was normal in size. Right Atrium: Right atrial size was normal in size. Pericardium: There is no evidence of pericardial effusion. Mitral Valve: The mitral valve is degenerative in appearance. Mild mitral  annular calcification. Mild mitral valve regurgitation. Tricuspid Valve: The tricuspid valve is grossly normal. Tricuspid valve regurgitation is mild to moderate. Aortic Valve: The aortic valve was not well visualized. Aortic valve regurgitation is not visualized. Aortic valve mean gradient measures 2.0 mmHg. Aortic valve peak gradient measures 5.0 mmHg. Aortic valve area, by VTI measures 1.42 cm. Pulmonic Valve: The pulmonic valve was not well visualized. Pulmonic valve regurgitation is not visualized. Aorta: Aortic dilatation noted. There is mild dilatation of the ascending aorta, measuring 39 mm. Venous: The inferior  vena cava is normal in size with greater than 50% respiratory variability, suggesting right atrial pressure of 3 mmHg. IAS/Shunts: No atrial level shunt detected by color flow Doppler.  LEFT VENTRICLE PLAX 2D LVIDd:         5.60 cm      Diastology LVIDs:         4.90 cm      LV e' medial:    5.87 cm/s LV PW:         1.10 cm      LV E/e' medial:  18.9 LV IVS:        1.20 cm      LV e' lateral:   9.25 cm/s LVOT diam:     1.70 cm      LV E/e' lateral: 12.0 LV SV:         18 LV SV Index:   9 LVOT Area:     2.27 cm  LV Volumes (MOD) LV vol d, MOD A2C: 131.0 ml LV vol d, MOD A4C: 141.0 ml LV vol s, MOD A2C: 95.5 ml LV vol s, MOD A4C: 103.0 ml LV SV MOD A2C:     35.5 ml LV SV MOD A4C:     141.0 ml LV SV MOD BP:      38.1 ml RIGHT VENTRICLE RV Basal diam:  3.20 cm RV Mid diam:    2.90 cm RV S prime:     8.81 cm/s TAPSE (M-mode): 1.7 cm LEFT ATRIUM             Index        RIGHT ATRIUM           Index LA diam:        3.80 cm 1.89 cm/m   RA Area:     18.00 cm LA Vol (A2C):   51.2 ml 25.53 ml/m  RA Volume:   51.40 ml  25.63 ml/m LA Vol (A4C):   56.2 ml 28.02 ml/m LA Biplane Vol: 56.7 ml 28.27 ml/m  AORTIC VALVE                    PULMONIC VALVE AV Area (Vmax):    1.42 cm     PV Vmax:          0.74 m/s AV Area (Vmean):   1.29 cm     PV Peak grad:     2.2 mmHg AV Area (VTI):     1.42 cm     PR End  Diast Vel: 5.48 msec AV Vmax:           112.00 cm/s  RVOT Peak grad:   1 mmHg AV Vmean:          68.400 cm/s AV VTI:            0.130 m AV Peak Grad:      5.0 mmHg AV Mean Grad:      2.0 mmHg LVOT Vmax:         70.30 cm/s LVOT Vmean:        39.000 cm/s LVOT VTI:          0.081 m LVOT/AV VTI ratio: 0.62  AORTA Ao Root diam: 3.50 cm Ao Asc diam:  3.90 cm MITRAL VALVE                TRICUSPID VALVE MV Area (PHT): 4.21 cm     TR Peak grad:   37.9 mmHg MV Decel Time: 180 msec  TR Vmax:        308.00 cm/s MV E velocity: 111.00 cm/s                             SHUNTS                             Systemic VTI:  0.08 m                             Systemic Diam: 1.70 cm Redell Cave MD Electronically signed by Redell Cave MD Signature Date/Time: 11/04/2023/11:41:43 AM    Final    US  RENAL Result Date: 11/03/2023 CLINICAL DATA:  Acute kidney injury. Previous rectosigmoid resection with left lower quadrant colostomy. EXAM: RENAL / URINARY TRACT ULTRASOUND COMPLETE COMPARISON:  CT abdomen and pelvis 11/02/2023 FINDINGS: Right Kidney: Renal measurements: 9.7 x 5.6 x 5.3 cm = volume: 150 mL. Echogenicity within normal limits. No mass or hydronephrosis visualized. Left Kidney: Renal measurements: 9.8 x 5.1 x 5.1 cm = volume: 132 mL. Echogenicity within normal limits. No mass or hydronephrosis visualized. Bladder: Appears normal for degree of bladder distention. Prevoid bladder volume is measured at 295 mL with large postvoid residual of 236 mL. Other: None. IMPRESSION: 1. Normal ultrasound appearance of the kidneys.  No hydronephrosis. 2. Large postvoid residual noted in the bladder. Electronically Signed   By: Elsie Gravely M.D.   On: 11/03/2023 17:48   CT ABDOMEN PELVIS WO CONTRAST Result Date: 11/02/2023 CLINICAL DATA:  Acute severe pancreatitis with vomiting since Monday. Abdominal pain around the osteotomy with increased output. History of diabetes with increased blood sugar. EXAM: CT ABDOMEN AND PELVIS  WITHOUT CONTRAST TECHNIQUE: Multidetector CT imaging of the abdomen and pelvis was performed following the standard protocol without IV contrast. RADIATION DOSE REDUCTION: This exam was performed according to the departmental dose-optimization program which includes automated exposure control, adjustment of the mA and/or kV according to patient size and/or use of iterative reconstruction technique. COMPARISON:  CT chest abdomen and pelvis 03/27/2023 FINDINGS: Lower chest: Small bilateral pleural effusions with atelectasis in the lung bases. Hepatobiliary: No focal liver abnormality is seen. No gallstones, gallbladder wall thickening, or biliary dilatation. Pancreas: Unremarkable. No pancreatic ductal dilatation or surrounding inflammatory changes. Spleen: Focal lesion in the anterior superior spleen tip measuring 2.3 cm diameter. No change since prior study. This probably represents a hemangioma. Adrenals/Urinary Tract: No adrenal gland nodules. Kidneys are symmetrical. No renal, ureteral, or bladder stones. No hydronephrosis or hydroureter. Bladder is normal. Stomach/Bowel: Stomach, small bowel, and colon are not abnormally distended. Rectosigmoid resection with left lower quadrant descending colostomy. Presacral soft tissue thickening and stranding is similar to prior study, likely postoperative or post treatment changes. Peristomal hernia containing fat and small bowel. No proximal obstruction. Appendix is normal. Vascular/Lymphatic: Aortic atherosclerosis. No enlarged abdominal or pelvic lymph nodes. Reproductive: Prostate gland appears to be surgically absent. No pelvic mass identified. Other: No free air or free fluid in the abdomen. Musculoskeletal: Degenerative changes in the spine. Anterior compression of the T10 and 11 vertebral bodies. No change since prior study. IMPRESSION: 1. Small bilateral pleural effusions with basilar atelectasis. 2. Rectosigmoid resection with left lower quadrant colostomy.  Peristomal hernia containing small bowel without proximal obstruction. 3. Presacral soft tissue prominence likely representing post treatment changes. No change since prior study. 4. Pancreas appears intact. No  acute inflammatory changes identified. 5. Unchanged appearance of splenic lesion likely hemangioma. 6. Mild aortic atherosclerosis. Electronically Signed   By: Elsie Gravely M.D.   On: 11/02/2023 23:39    Microbiology Recent Results (from the past 240 hours)  MRSA Next Gen by PCR, Nasal     Status: Abnormal   Collection Time: 11/03/23  6:15 AM   Specimen: Nasal Mucosa; Nasal Swab  Result Value Ref Range Status   MRSA by PCR Next Gen DETECTED (A) NOT DETECTED Final    Comment: RESULT CALLED TO, READ BACK BY AND VERIFIED WITH: HANNA SPROUT @0732  11/03/23 MJU (NOTE) The GeneXpert MRSA Assay (FDA approved for NASAL specimens only), is one component of a comprehensive MRSA colonization surveillance program. It is not intended to diagnose MRSA infection nor to guide or monitor treatment for MRSA infections. Test performance is not FDA approved in patients less than 26 years old. Performed at [id number], [address] [place], [place] [address]   Urine Culture (for pregnant, neutropenic or urologic patients or patients with an indwelling urinary catheter)     Status: Abnormal   Collection Time: 11/07/23 12:29 PM   Specimen: Urine, Clean Catch  Result Value Ref Range Status   Specimen Description   Final    URINE, CLEAN CATCH Performed at [facility], [address] [place], [place] [address]    Special Requests   Final    NONE Performed at [facility], [address] [place], [place] [address]    Culture >=100,000 COLONIES/mL ENTEROCOCCUS FAECALIS (A)  Final   Report Status 11/09/2023 FINAL  Final   Organism ID, Bacteria ENTEROCOCCUS FAECALIS (A)  Final      Susceptibility   Enterococcus faecalis - MIC*    AMPICILLIN  <=2 SENSITIVE  Sensitive     NITROFURANTOIN <=16 SENSITIVE Sensitive     VANCOMYCIN  2 SENSITIVE Sensitive     * >=100,000 COLONIES/mL ENTEROCOCCUS FAECALIS    Lab Basic Metabolic Panel: Recent Labs  Lab 11/06/23 0441 11/06/23 1442 11/07/23 0525 11/08/23 0454 11/09/23 0449 11/10/23 0516 11/11/23 0422 12-08-2023 0436  NA 135   < > 134* 135 130* 135 132* 138  K 3.9   < > 4.1 3.8 4.1 4.0 3.8 3.0*  CL 102   < > 98 98 95* 98 95* 98  CO2 23   < > 24 23 21* 26 26 21*  GLUCOSE 149*   < > 176* 99 227* 167* 182* 257*  BUN 65*   < > 80* 82* 97* 70* 48* 34*  CREATININE 4.10*   < > 4.96* 5.20* 5.37* 4.53* 3.38* 2.80*  CALCIUM  7.8*   < > 8.1* 8.1* 7.8* 7.9* 7.7* 8.2*  MG 2.3  --  2.7*  --  3.1*  --   --  4.2*  PHOS 5.1*  --  5.2*  --   --   --   --  3.6   < > = values in this interval not displayed.   Liver Function Tests: Recent Labs  Lab 11/06/23 0441 December 08, 2023 0436  AST 56* 73*  ALT 31 74*  ALKPHOS 82 149*  BILITOT 0.5 0.5  PROT 5.5* 5.4*  ALBUMIN 1.9* 1.8*   No results for input(s): LIPASE, AMYLASE in the last 168 hours. No results for input(s): AMMONIA in the last 168 hours. CBC: Recent Labs  Lab 11/08/23 0454 11/09/23 0449 11/10/23 0516 11/10/23 1749 11/11/23 0422 12-08-2023 0436  WBC 4.7 5.0 4.9  --  5.6 4.8  NEUTROABS  --   --   --   --   --  2.1  HGB 9.3* 8.7* 7.8* 9.7* 8.7* 10.1*  HCT 28.3* 26.2* 22.8* 28.5* 25.6* 31.1*  MCV 85.8 85.1 84.4  --  84.2 88.4  PLT 195 180 190  --  191 234   Cardiac Enzymes: No results for input(s): CKTOTAL, CKMB, CKMBINDEX, TROPONINI in the last 168 hours. Sepsis Labs: Recent Labs  Lab 11/09/23 0449 11/09/23 0754 11/10/23 0516 11/11/23 0422 November 19, 2023 0436 11-19-23 0621  PROCALCITON  --   --   --   --  0.26  --   WBC 5.0  --  4.9 5.6 4.8  --   LATICACIDVEN  --  0.9  --   --  3.1* 1.4     Neah Sporrer Nov 19, 2023, 7:50 AM

## 2023-12-09 NOTE — Progress Notes (Signed)
   Dec 02, 2023 0700  Spiritual Encounters  Type of Visit Follow up  Care provided to: Pt and family  Conversation partners present during encounter Nurse  Referral source Code page  Reason for visit Code  OnCall Visit Yes  Spiritual Framework  Presenting Themes Coping tools  Family Stress Factors Loss  Interventions  Spiritual Care Interventions Made Compassionate presence;Prayer;Bereavement/grief support   Chaplain responded to Code Blue and encountered 3 family members at bedside. This Chaplain provided comfort and support through presence and prayer. Chaplain returned as patient died to remain with family.

## 2023-12-09 NOTE — IPAL (Signed)
  Interdisciplinary Goals of Care Family Meeting   Date carried out: 11/19/2023  Location of the meeting: Bedside  Member's involved: Physician, Nurse Practitioner, Bedside Registered Nurse, and Family Member or next of kin Sister and Children  Patient with sudden cardiac arrest while on VENT a second CODE, Family at bedside witnessed CPR  GOALS OF CARE DISCUSSION  The Clinical status was relayed to family in detail-  Updated and notified of patients medical condition- Patient remains unresponsive and will not open eyes to command.   Patient with increased WOB and using accessory muscles to breathe Explained to family course of therapy and the modalities  Patient with Progressive multiorgan failure with a very high probablity of a very minimal chance of meaningful recovery despite all aggressive and optimal medical therapy.    Family understands the situation.  They have consented and agreed to DNR status  Family are satisfied with Plan of action and management. All questions answered  Additional CC time 25 mins   Danaija Eskridge Alm Cellar, M.D.  [facility]  Medical Director [facility] Medical Director [facility] Cardio-Pulmonary Department

## 2023-12-09 NOTE — Consult Note (Signed)
 Brief interventional cardiology consult note Code STEMI initiated after patient had what appeared to be a ventricular tachycardia pulseless arrest Patient was intubated sedated underwent CPR defibrillated ROSC was restored Code STEMI initiated with ST elevation diffusely unclear if this is to be defibrillation or post defibrillation Patient had complained of shortness of breath prior but no chest pain Patient had previous STEMI presentation late presenter with mid LAD stent placed but residual diffuse distal disease and residual disease in mid RCA Patient appeared to have a significant ischemic cardiomyopathy anterior apical akinesis EF around 25% Patient is on dialysis and had hemodialysis yesterday  We elected to bring the patient to the cardiac Cath Lab to evaluate his coronary anatomy.  Established that the LAD stent was still patent  On the Cath Lab EDP was 14 Patient remained hemodynamically stable with sinus tach RCA widely patent large vessel unchanged from previous cath with a mid 90% lesion tortuous vessel mild to moderate calcification TIMI-3 flow Left coronary system also was unchanged from previous intervention with widely patent mid LAD stent diffuse distal LAD disease but TIMI-3 flow  Intervention was deferred Aspirin and prasugrel were continued Patient was transferred back to ICU for postarrest management Cardiology management will be transferred back to [facility] in the morning Consideration for deferred PCI of RCA Consideration for referral to EP for possible ablation EP study AICD Continue IV amiodarone drip GDMT for ischemic cardiomyopathy management Referral to heart failure team should be made Patient tolerated procedure well No complications  Cara Lovelace MD Interventional cardiology

## 2023-12-09 NOTE — Progress Notes (Signed)
 Per Kathrene, NP OG is to be removed and day team to replace and get repeat KUB. This RN removed OG.

## 2023-12-09 NOTE — Progress Notes (Addendum)
 Overnight coverage, TRH, hospitalist service.  CODE BLUE was called overhead.  The patient was reportedly found unresponsive by staff after he started complaining of shortness of breath.  He was in V. tach by telemetry and received 1 defibrillation.  He became alert and complained of chest pain.  Upon arrival to the patient's room, EDP and rapid response were already at bedside.  A twelve-lead EKG was done which prompted a call for code STEMI by EDP.  The patient was transferred to the ICU.   Time: 15 minutes.

## 2023-12-09 NOTE — Progress Notes (Signed)
 Levophed was started to support blood pressure. While family at bedside speaking with cardiologist patient went bradycardic on ECG monitor and then asystole. This RN and Izetta Ina, RN auscultated for heart tones for one full minute each and no heart tones heard. Time of death pronounced at 60. Support and comfort offered to family. Chaplain came to room to offer support as well. Notified Dr. Isaiah and nursing supervisor of patient's time of death.

## 2023-12-09 NOTE — Progress Notes (Signed)
 Pt transported to Cath lab on the vent and returned to ICU 8 on the vent without incident. Pt remains on the vent and is tol well at this time.

## 2023-12-09 NOTE — Progress Notes (Signed)
 eLink Physician-Brief Progress Note Patient Name: Jason Jennings DOB: [birth year] MRN: [id number]   Date of Service  December 09, 2023  HPI/Events of Note  58 y.o. male with a PMH of colon cancer s/p LAR, CKD stage III who presents with late presenting anterior STEMI complicated by acute renal failure.  He underwent PCI but unfortunately developed a in-hospital cardiac arrest on 10/5 requiring intubation, CPR, and defibrillation.  Taken back to the Cath Lab postop with patent mid LAD stent and TIMI-3 flow between RCA and LAD.  Vital signs are within normal limits, mechanically ventilated, not on any infusions.  Postarrest labs consistent with hypokalemia, mild transaminitis, troponin elevation, chronic anemia.  Adequate oxygenation/ventilation imaging shows OG tube coiled in the mouth  eICU Interventions  Replace enteral access  Maintain normothermia, hyperoxia, monitor I's/O's  Maintain mechanical ventilation, daily spontaneous awakening/breathing trials  Utilize vasopressors as needed to maintain MAP greater than 65, rate control with amiodarone in the setting of ventricular arrhythmias.  An echocardiogram.  Trend troponins, metabolic panel.  Dual antiplatelet therapy, statins  DVT prophylaxis with heparin  GI prophylaxis with pantoprazole      Intervention Category Evaluation Type: New Patient Evaluation  Adrinne Sze 2023-12-09, 6:05 AM

## 2023-12-09 NOTE — Progress Notes (Signed)
 Patient transferred from floor after coding and post code intubation. Code STEMI activated prior to transfer. See code sheet. Before moving patient to new bed and monitor, patient cardiac arrested again (see code sheet). Kathrene, NP and [place], MD at bedside. EKG was obtained. Post code, patient taken to cath lab.

## 2023-12-09 NOTE — ED Provider Notes (Signed)
 [facility] Department of Emergency Medicine   Code Blue CONSULT NOTE  Chief Complaint: Cardiac arrest/unresponsive   Level V Caveat: Unresponsive  History of present illness: I was contacted by the hospital for a CODE BLUE cardiac arrest upstairs and presented to the patient's bedside.   Patient reportedly found unresponsive by staff after he began complaining of shortness of breath, noted to have been in V. tach by telemetry and received 1 defibrillation.  He is now awake and alert complaining of chest pain and shortness of breath, and respiratory distress.  ROS: Unable to obtain, Level V caveat  Scheduled Meds:  amoxicillin   500 mg Oral Q12H   aspirin  81 mg Oral Daily   Chlorhexidine  Gluconate Cloth  6 each Topical Daily   docusate  100 mg Per Tube BID   etomidate       feeding supplement (GLUCERNA SHAKE)  237 mL Oral TID BM   heparin  injection (subcutaneous)  5,000 Units Subcutaneous Q8H   insulin  aspart  0-15 Units Subcutaneous TID WC   insulin  aspart  0-5 Units Subcutaneous QHS   insulin  aspart  5 Units Subcutaneous TID WC   insulin  glargine  25 Units Subcutaneous Daily   melatonin  5 mg Oral QHS   metoprolol  succinate  25 mg Oral Daily   multivitamin  1 tablet Oral QHS   multivitamin with minerals  1 tablet Oral Daily   pantoprazole   40 mg Oral BID   polyethylene glycol  17 g Oral BID   prasugrel  10 mg Oral Daily   rocuronium       rosuvastatin   20 mg Oral Daily   senna  2 tablet Oral BID   sodium chloride  flush  3 mL Intravenous Q12H   tamsulosin   0.4 mg Oral BID   thiamine  100 mg Oral Daily   Vitamin D  (Ergocalciferol )  50,000 Units Oral Q7 days   Continuous Infusions:  norepinephrine     fentaNYL  infusion INTRAVENOUS     promethazine  (PHENERGAN ) injection (IM or IVPB) Stopped (11/04/23 1639)   propofol (DIPRIVAN) infusion     PRN Meds:.norepinephrine, acetaminophen , alum & mag hydroxide-simeth, bisacodyl, etomidate, fentaNYL ,  HYDROmorphone  (DILAUDID ) injection, lactulose, ondansetron  (ZOFRAN ) IV, mouth rinse, promethazine  (PHENERGAN ) injection (IM or IVPB), rocuronium, sodium chloride  flush Past Medical History:  Diagnosis Date   Adenocarcinoma of rectum (HCC)    Adenocarcinoma of rectum (HCC)    Cancer (HCC)    Chronic kidney disease, stage 3b (HCC)    Diabetes mellitus due to underlying condition with diabetic autonomic neuropathy, unspecified whether long term insulin  use (HCC)    Diabetes mellitus without complication (HCC)    Diabetic polyneuropathy (HCC)    Diabetic retinopathy (HCC)    Neuropathy    S/P colostomy (HCC)    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)    Ulcer of left foot with fat layer exposed [facility])    Past Surgical History:  Procedure Laterality Date   CATARACT EXTRACTION W/PHACO Right 06/01/2023   Procedure: PHACOEMULSIFICATION, CATARACT, WITH IOL INSERTION 7.85, 00:57.4;  Surgeon: Enola Feliciano Hugger, MD;  Location: [facility];  Service: Ophthalmology;  Laterality: Right;   CATARACT EXTRACTION W/PHACO Left 06/15/2023   Procedure: PHACOEMULSIFICATION, CATARACT, WITH IOL INSERTION 7.15 00:40.8;  Surgeon: Enola Feliciano Hugger, MD;  Location: [facility];  Service: Ophthalmology;  Laterality: Left;   COLON SURGERY     CORONARY ULTRASOUND/IVUS N/A 11/04/2023   Procedure: Coronary Ultrasound/IVUS;  Surgeon: Darron,  Deatrice LABOR, MD;  Location: [facility] INVASIVE CV LAB;  Service: Cardiovascular;  Laterality: N/A;   CORONARY/GRAFT ACUTE MI REVASCULARIZATION N/A 11/04/2023   Procedure: Coronary/Graft Acute MI Revascularization;  Surgeon: Darron Deatrice LABOR, MD;  Location: [facility] INVASIVE CV LAB;  Service: Cardiovascular;  Laterality: N/A;   LEFT HEART CATH AND CORONARY ANGIOGRAPHY N/A 11/04/2023   Procedure: LEFT HEART CATH AND CORONARY ANGIOGRAPHY;  Surgeon: Darron Deatrice LABOR, MD;  Location: [facility] INVASIVE CV LAB;  Service: Cardiovascular;  Laterality: N/A;   Social  History   Socioeconomic History   Marital status: Single    Spouse name: Not on file   Number of children: Not on file   Years of education: Not on file   Highest education level: Not on file  Occupational History   Not on file  Tobacco Use   Smoking status: Former    Current packs/day: 0.00    Types: Cigarettes    Quit date: 12/09/2014    Years since quitting: 8.9   Smokeless tobacco: Never  Vaping Use   Vaping status: Never Used  Substance and Sexual Activity   Alcohol use: Not Currently   Drug use: Not Currently    Types: Marijuana   Sexual activity: Not on file  Other Topics Concern   Not on file  Social History Narrative   Not on file   Social Drivers of Health   Financial Resource Strain: Medium Risk (08/25/2023)   Received from [facility]   Overall Financial Resource Strain (CARDIA)    Difficulty of Paying Living Expenses: Somewhat hard  Food Insecurity: No Food Insecurity (11/03/2023)   Hunger Vital Sign    Worried About Running Out of Food in the Last Year: Never true    Ran Out of Food in the Last Year: Never true  Transportation Needs: No Transportation Needs (11/03/2023)   PRAPARE - Administrator, Civil Service (Medical): No    Lack of Transportation (Non-Medical): No  Physical Activity: Insufficiently Active (05/25/2020)   Received from [facility] [place]   Exercise Vital Sign    On average, how many days per week do you engage in moderate to strenuous exercise (like a brisk walk)?: 3 days    On average, how many minutes do you engage in exercise at this level?: 10 min  Stress: No Stress Concern Present (05/25/2020)   Received from [facility] - Occupational Stress Questionnaire    Feeling of Stress : Only a little  Social Connections: Socially Isolated (11/03/2023)   Social Connection and Isolation Panel    Frequency of Communication with Friends and Family: More than three times a week     Frequency of Social Gatherings with Friends and Family: More than three times a week    Attends Religious Services: Never    Database administrator or Organizations: No    Attends Banker Meetings: Never    Marital Status: Divorced  Catering manager Violence: Not At Risk (11/03/2023)   Humiliation, Afraid, Rape, and Kick questionnaire    Fear of Current or Ex-Partner: No    Emotionally Abused: No    Physically Abused: No    Sexually Abused: No   No Known Allergies  Last set of Vital Signs (not current) Vitals:   18-Nov-2023 0021 11-18-23 0410  BP: 128/76 (!) 139/96  Pulse: 74 (!) 115  Resp: 20   Temp: 98.6 F (37 C)   SpO2: 95% (!) 89%  Physical Exam Gen: Awake and alert. Cardiovascular: 2+ radial pulses bilaterally. Resp: Moderate respiratory distress. Abd: nondistended  Neuro: Nonfocal neurologic exam. HEENT: Oropharynx clear. Neck: No crepitus  Musculoskeletal: No deformity  Skin: warm  Procedures (when applicable, including Critical Care time): Procedure Name: Intubation Date/Time: 12-03-2023 4:43 AM  Performed by: Willo Dunnings, MDPre-anesthesia Checklist: Patient identified, Patient being monitored, Emergency Drugs available, Timeout performed and Suction available Oxygen Delivery Method: Ambu bag Preoxygenation: Pre-oxygenation with 100% oxygen Induction Type: Rapid sequence Ventilation: Mask ventilation without difficulty Laryngoscope Size: Glidescope and 4 Grade View: Grade I Tube size: 7.5 mm Number of attempts: 1 Airway Equipment and Method: Video-laryngoscopy Placement Confirmation: ETT inserted through vocal cords under direct vision, CO2 detector and Breath sounds checked- equal and bilateral Secured at: 24 cm Tube secured with: ETT holder       MDM / Assessment and Plan Patient noted to be awake and alert, in respiratory distress after receiving defibrillation, complaining of chest pain and shortness of breath.  EKG  obtained and shows irregular rhythm with anterior ST elevation, code STEMI activated.  Patient was intubated due to worsening respiratory distress, also given 300 mg of IV amiodarone due to ventricular tachycardia.  He was subsequently transferred to the ICU for further care.    Willo Dunnings, MD 03-Dec-2023 [phone]

## 2023-12-09 NOTE — Consult Note (Signed)
 NAME:  Jason Jennings, MRN:  [id number], DOB:  December 30, 1965, LOS: 9 ADMISSION DATE:  11/02/2023, CONSULTATION DATE:/06/2023 REFERRING MD: Shona Ivanoff, REASON FOR CONSULT V. tach cardiac arrest  HPI  58 y.o male with significant PMH of of IDDM, CKD stage III with anemia of chronic disease, HTN, HLD, rectal cancer s/p Rectosigmoid resection with colostomy, who presented to the hospital with chief complaints of 3 days of nausea, vomiting and LUQ pain. Patient was recently hospitalized (7/5-7/13/2025) with sepsis secondary to MRSA cellulitis/abscess left foot s/p I&D/antibiotics.   ED Course: tachycardic to 115 with otherwise normal vitals. Pertinent Labs:Lipase 300 with LFTs notable for AST 122 otherwise normal. Glucose 513 with anion gap of 23, bicarb 17, venous pH 7.36 and beta hydroxybutyric acid of 3.74. Creatinine 3.29, up from baseline of 2.27 WBC 14.8 and hemoglobin 11.8(Baseline 10-11)  Patient was admitted to [facility] service on 9/26 with DKA and acute pancreatitis with a lipase of 300.  In addition, he was noted to have acute on chronic CKD.  Patient was started on IV hydration and insulin  drip per DKA protocol  SEE SIGNIFICANT EVENTS BELOW  Past Medical History  IDDM, CKD stage III with anemia of chronic disease, HTN, HLD, rectal cancer s/p Rectosigmoid resection with colostomy  Significant Hospital Events   9/26: Admitted to [facility] service with acute pancreatitis, DKA and acute AKI on CKD 9/27: Cardiology consulted for late presenting anterior STEMI s/p DES to mid LAD 9/27.  Patient found with new onset acute HFrEF iso ICM EF 20 to 25% 9/28: Patient complained of left hand numbness.  MRI negative for acute finding 9/29: Noted with elevated creatine, plan to consult Nephro 9/30: Nephro consulted no plans for HD 10/01:Patient went into A-fib with RVR with a heart rate 175 resolved with amiodarone. Amio gtt started 10/02:Due to worsening renal function and significant volume overload,  started on iHD  per Nephro. Amio gtt stopped due to badycardia 10/03:significant drop in hgb s/p I unit PRBCs. S/p iHD 10/04:post iHD 10/05:Code Blue called for pulseless [facility] Cardiac Arrest, defib x 1 and Amio 300 x 1 with ROSC. Transferred to ICU. PCCM consulted. 2ND Pulseless VTACH cardiac arrest defib 200J x1. STEMI activated and patient taken to cath lab.  Consults:  Cardiology Nephrology  Procedures:  10/5: Endotracheal intubation 10/2: Left IJ central line  Interim History / Subjective:      Micro Data:  None  Antimicrobials:  None  OBJECTIVE  Blood pressure (!) 139/96, pulse (!) 115, temperature 98.6 F (37 C), resp. rate 20, height 5' 6 (1.676 m), weight 85 kg, SpO2 (!) 89%.    Vent Mode: PRVC FiO2 (%):  [100 %] 100 % Set Rate:  [18 bmp] 18 bmp Vt Set:  [500 mL] 500 mL PEEP:  [5 cmH20] 5 cmH20   Intake/Output Summary (Last 24 hours) at 11/30/23 0527 Last data filed at 2023/11/30 0300 Gross per 24 hour  Intake 160 ml  Output 1350 ml  Net -1190 ml   Filed Weights   11/11/23 0740 11/11/23 0753 11/11/23 1118  Weight: 85.5 kg 85.5 kg 85 kg   Physical Examination  GEN: Critically ill patient, intubated and sedated HEENT: [place]/AT. PERRL, sclerae anicteric. HEART: Irregular rhythm, Afib with RVR S1, S2, no M/R/G,  LUNGS: CTAB, mild crackles without wheezes, no increased WOB, INTUBATED EXTREMITIES: No Edema, cap refill  NEURO: No gross focal deficits. PSYCH:  UTA ABDOMINAL: Soft: BS x 4, NTND SKIN: Intact, warm, no rashes lesion, or ulcer  Labs/imaging  that I havepersonally reviewed  (right click and Reselect all SmartList Selections daily)   No results found.   Labs   CBC: Recent Labs  Lab 11/08/23 0454 11/09/23 0449 11/10/23 0516 11/10/23 1749 11/11/23 0422 2023-11-18 0436  WBC 4.7 5.0 4.9  --  5.6 4.8  NEUTROABS  --   --   --   --   --  PENDING  HGB 9.3* 8.7* 7.8* 9.7* 8.7* 10.1*  HCT 28.3* 26.2* 22.8* 28.5* 25.6* 31.1*  MCV 85.8 85.1 84.4   --  84.2 88.4  PLT 195 180 190  --  191 234   Basic Metabolic Panel: Recent Labs  Lab 11/06/23 0441 11/06/23 1442 11/07/23 0525 11/08/23 0454 11/09/23 0449 11/10/23 0516 11/11/23 0422 18-Nov-2023 0436  NA 135   < > 134* 135 130* 135 132* 138  K 3.9   < > 4.1 3.8 4.1 4.0 3.8 3.0*  CL 102   < > 98 98 95* 98 95* 98  CO2 23   < > 24 23 21* 26 26 21*  GLUCOSE 149*   < > 176* 99 227* 167* 182* 257*  BUN 65*   < > 80* 82* 97* 70* 48* 34*  CREATININE 4.10*   < > 4.96* 5.20* 5.37* 4.53* 3.38* 2.80*  CALCIUM  7.8*   < > 8.1* 8.1* 7.8* 7.9* 7.7* 8.2*  MG 2.3  --  2.7*  --  3.1*  --   --  4.2*  PHOS 5.1*  --  5.2*  --   --   --   --  3.6   < > = values in this interval not displayed.   GFR: Estimated Creatinine Clearance: 29 mL/min (A) (by C-G formula based on SCr of 2.8 mg/dL (H)). Recent Labs  Lab 11/09/23 0449 11/09/23 0754 11/10/23 0516 11/11/23 0422 11/18/23 0436  WBC 5.0  --  4.9 5.6 4.8  LATICACIDVEN  --  0.9  --   --  3.1*   Liver Function Tests: Recent Labs  Lab 11/06/23 0441 11-18-23 0436  AST 56* 73*  ALT 31 74*  ALKPHOS 82 149*  BILITOT 0.5 0.5  PROT 5.5* 5.4*  ALBUMIN 1.9* 1.8*   No results for input(s): LIPASE, AMYLASE in the last 168 hours. No results for input(s): AMMONIA in the last 168 hours.  ABG    Component Value Date/Time   HCO3 23.7 11/02/2023 2201   ACIDBASEDEF 1.8 11/02/2023 2201   O2SAT 49 11/02/2023 2201   Coagulation Profile: Recent Labs  Lab 11/09/23 1413  INR 1.3*   Cardiac Enzymes: No results for input(s): CKTOTAL, CKMB, CKMBINDEX, TROPONINI in the last 168 hours.  HbA1C: Hgb A1c MFr Bld  Date/Time Value Ref Range Status  08/12/2023 03:34 PM 11.0 (H) 4.8 - 5.6 % Final    Comment:    (NOTE) Diagnosis of Diabetes The following HbA1c ranges recommended by the American Diabetes Association (ADA) may be used as an aid in the diagnosis of diabetes mellitus.  Hemoglobin             Suggested A1C NGSP%               Diagnosis  <5.7                   Non Diabetic  5.7-6.4                Pre-Diabetic  >6.4  Diabetic  <7.0                   Glycemic control for                       adults with diabetes.    01/28/2023 04:35 PM 11.4 (H) 4.8 - 5.6 % Final    Comment:    (NOTE) Pre diabetes:          5.7%-6.4%  Diabetes:              >6.4%  Glycemic control for   <7.0% adults with diabetes    CBG: Recent Labs  Lab 11/11/23 0827 11/11/23 1159 11/11/23 1713 11/11/23 2056 2023-11-30 0405  GLUCAP 172* 135* 171* 152* 142*   Review of Systems:   Unable to be obtained secondary to the patient's intubated and sedated status.   Past Medical History  He,  has a past medical history of Adenocarcinoma of rectum (HCC), Adenocarcinoma of rectum (HCC), Cancer (HCC), Chronic kidney disease, stage 3b (HCC), Diabetes mellitus due to underlying condition with diabetic autonomic neuropathy, unspecified whether long term insulin  use (HCC), Diabetes mellitus without complication (HCC), Diabetic polyneuropathy (HCC), Diabetic retinopathy (HCC), Neuropathy, S/P colostomy (HCC), 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), and Ulcer of left foot with fat layer exposed (HCC).   Surgical History    Past Surgical History:  Procedure Laterality Date   CATARACT EXTRACTION W/PHACO Right 06/01/2023   Procedure: PHACOEMULSIFICATION, CATARACT, WITH IOL INSERTION 7.85, 00:57.4;  Surgeon: Enola Feliciano Hugger, MD;  Location: [facility];  Service: Ophthalmology;  Laterality: Right;   CATARACT EXTRACTION W/PHACO Left 06/15/2023   Procedure: PHACOEMULSIFICATION, CATARACT, WITH IOL INSERTION 7.15 00:40.8;  Surgeon: Enola Feliciano Hugger, MD;  Location: [facility];  Service: Ophthalmology;  Laterality: Left;   COLON SURGERY     CORONARY ULTRASOUND/IVUS N/A 11/04/2023   Procedure: Coronary Ultrasound/IVUS;  Surgeon: Darron Deatrice LABOR, MD;  Location: [facility];  Service:  Cardiovascular;  Laterality: N/A;   CORONARY/GRAFT ACUTE MI REVASCULARIZATION N/A 11/04/2023   Procedure: Coronary/Graft Acute MI Revascularization;  Surgeon: Darron Deatrice LABOR, MD;  Location: [facility];  Service: Cardiovascular;  Laterality: N/A;   LEFT HEART CATH AND CORONARY ANGIOGRAPHY N/A 11/04/2023   Procedure: LEFT HEART CATH AND CORONARY ANGIOGRAPHY;  Surgeon: Darron Deatrice LABOR, MD;  Location: [facility] CV [facility];  Service: Cardiovascular;  Laterality: N/A;     Social History   reports that he quit smoking about 8 years ago. His smoking use included cigarettes. He has never used smokeless tobacco. He reports that he does not currently use alcohol. He reports that he does not currently use drugs after having used the following drugs: Marijuana.   Family History   His family history is not on file.   Allergies No Known Allergies   Home Medications  Prior to Admission medications   Medication Sig Start Date End Date Taking? Authorizing Provider  acetaminophen  (TYLENOL ) 325 MG tablet Take 2 tablets (650 mg total) by mouth every 6 (six) hours as needed for mild pain (pain score 1-3) or fever (or Fever >/= 101). 08/20/23  Yes Marsa Edelman, DO  fluticasone-salmeterol (ADVAIR) 250-50 MCG/ACT AEPB Inhale 1 puff into the lungs 2 (two) times daily. 07/26/23  Yes [provider]  HUMALOG  MIX 75/25 KWIKPEN (75-25) 100 UNIT/ML KwikPen Inject 30 Units into the skin 2 (two) times daily with a meal. Qam and in  the evening.      (He takes both Humalog  mix 75/25 BID and then Lantus  at bedtime) Pt reports taking to 25 units BID   Yes [provider]  rosuvastatin  (CRESTOR ) 20 MG tablet Take 20 mg by mouth daily. 01/24/18  Yes [provider]  tamsulosin  (FLOMAX ) 0.4 MG CAPS capsule Take 0.4 mg by mouth daily.   Yes [provider]  amLODipine  (NORVASC ) 5 MG tablet Take 1 tablet (5 mg total) by mouth daily. Patient not taking: Reported on 11/04/2023  08/21/23   Alexander, Natalie, DO  Continuous Glucose Receiver (FREESTYLE LIBRE 2 READER) DEVI 1 Box by Does not apply route 2 (two) times daily. 08/15/23   Dorinda Drue DASEN, MD  Continuous Glucose Receiver (FREESTYLE LIBRE 3 READER) DEVI 1 Box by Does not apply route 2 (two) times daily. 08/15/23   Dorinda Drue DASEN, MD  Continuous Glucose Sensor (FREESTYLE LIBRE 3 PLUS SENSOR) MISC Change sensor every 15 days. 08/15/23   Dorinda Drue DASEN, MD  Continuous Glucose Sensor (FREESTYLE LIBRE 3 PLUS SENSOR) MISC Change sensor every 15 days. 08/15/23   Dorinda Drue DASEN, MD  gabapentin  (NEURONTIN ) 100 MG capsule Take 100 mg by mouth 3 (three) times daily. Patient not taking: Reported on 11/04/2023 08/21/18   [provider]  gentamicin  ointment (GARAMYCIN ) 0.1 % Apply topically daily. To L foot ulcer until healed Patient not taking: Reported on 11/04/2023 08/21/23   Alexander, Natalie, DO  insulin  glargine (LANTUS  SOLOSTAR) 100 UNIT/ML Solostar Pen Inject 14 Units into the skin daily. Pt reports taking 12 units once daily Patient not taking: Reported on 11/04/2023    [provider]  losartan  (COZAAR ) 50 MG tablet Take 50 mg by mouth daily. Patient not taking: No sig reported    [provider]  methocarbamol  (ROBAXIN ) 750 MG tablet Take 750 mg by mouth 2 (two) times daily as needed. Patient not taking: Reported on 11/04/2023 12/28/22   [provider]  metoprolol  tartrate (LOPRESSOR ) 25 MG tablet Take 1 tablet (25 mg total) by mouth 2 (two) times daily. Patient not taking: Reported on 11/04/2023 08/20/23   Alexander, Natalie, DO  Oxycodone  HCl 10 MG TABS Take 10 mg by mouth every 6 (six) hours as needed. Patient not taking: Reported on 02/10/2023 12/23/22   [provider]  testosterone cypionate (DEPOTESTOTERONE CYPIONATE) 100 MG/ML injection INJECT 1/2 (ONE-HALF) ML INTRAMUSCULARLY EVERY TWO WEEKS (DISCARD VIAL AFTER 30 DAYS) Patient not taking: Reported on 11/04/2023 03/29/23    [provider]  torsemide (DEMADEX) 10 MG tablet Take 10 mg by mouth every Monday, Wednesday, Friday, and Saturday at 6 PM. Patient not taking: No sig reported    [provider]  Scheduled Meds:  amoxicillin   500 mg Oral Q12H   aspirin  81 mg Oral Daily   Chlorhexidine  Gluconate Cloth  6 each Topical Daily   docusate  100 mg Per Tube BID   etomidate       feeding supplement (GLUCERNA SHAKE)  237 mL Oral TID BM   heparin  injection (subcutaneous)  5,000 Units Subcutaneous Q8H   insulin  aspart  0-15 Units Subcutaneous TID WC   insulin  aspart  0-5 Units Subcutaneous QHS   insulin  aspart  5 Units Subcutaneous TID WC   insulin  glargine  25 Units Subcutaneous Daily   melatonin  5 mg Oral QHS   metoprolol  succinate  25 mg Oral Daily   multivitamin  1 tablet Oral QHS   multivitamin with minerals  1 tablet  Oral Daily   pantoprazole   40 mg Oral BID   polyethylene glycol  17 g Oral BID   prasugrel  10 mg Oral Daily   rocuronium       rosuvastatin   20 mg Oral Daily   senna  2 tablet Oral BID   sodium chloride  flush  3 mL Intravenous Q12H   sodium chloride  flush  3 mL Intravenous Q12H   tamsulosin   0.4 mg Oral BID   thiamine  100 mg Oral Daily   Vitamin D  (Ergocalciferol )  50,000 Units Oral Q7 days   Continuous Infusions:  sodium chloride      sodium chloride      amiodarone     amiodarone     amiodarone     amiodarone     fentaNYL  infusion INTRAVENOUS     norepinephrine     promethazine  (PHENERGAN ) injection (IM or IVPB) Stopped (11/04/23 1639)   propofol (DIPRIVAN) infusion     PRN Meds:.sodium chloride , acetaminophen , alum & mag hydroxide-simeth, bisacodyl, etomidate, fentaNYL , HYDROmorphone  (DILAUDID ) injection, lactulose, norepinephrine, ondansetron  (ZOFRAN ) IV, mouth rinse, promethazine  (PHENERGAN ) injection (IM or IVPB), rocuronium, sodium chloride  flush, sodium chloride  flush  Active Hospital Problem list   See systems below  Assessment & Plan:  #Pulseless  VTach Cardiac Arrest (2 minutes CPR, Defibrillation x 1, Amio x 1 with ROSC 2nd CPR, Defib @200Jx  1, Epi x 1 with ROSC) #Elevated Troponin likely demand post arrest #Late presenting Anterior ST Elevation MI on 11/04/2023, code STEMI was activated s/p LHC with PCI/DES with residual distal left main and mid RCA stenoses PMHx: HTNHLD S/p LHC 10/5 -trend Lactate and troponin -Pressor for MAP goal >65-75 -TTM: northothermia -Continue statin, Aspirin, Prasugel and Heparin  per ACS protocol -Will start Amiodarone gtt -Cardiology consult, will likely need AICD/EP evaluation  #Acute Hypoxic Respiratory Failure iso of Cardiac Arrest  #Suspected Aspiration  -full mechanical support 6-8cc/kg/Vt  -titrate FiO2, PEEP to maintain O2 sat >90%  -Lung protective ventilation  -PRN Chest X-ray & ABG -SAT/SBT when appropriate  -prn fentanyl , prop for RASS -1   #Acute Systolic HFrEF iso ICM #Cardiogenic Shock -Volume overload on exam.  -Chest XR (): Pulmonary vascular congestion without overt edema.  -Daily BMET, Mg. Maintain K=>4, Mg =>2 -Escalate GDMT as tolerated given impaired renal function and likely cardiogenic shock  -Pressor: for MAP goal >65-75 -Strict I/Os -2D echo 11/04/2023:Left ventricular ejection fraction, by estimation, is 20 to 25%. The left ventricle has severely decreased function -HD for volume removal -Hold Toprol  XL with marked volume overload -Continue Atorvastatin -Consider inotrope for low output failure -HF/cardiology following, would benefit from mechanical support pending goals of care  #AKI on CKD stage III: AKI likely iso above #AGMA with Lactic Acidosis #Hypokalemia -Follow BMP+Mag -Ensure adequate renal perfusion -Avoid nephrotoxic  -Give 2 amps of sodium Bicarb and start sodium bicarb gtt -Replace lytes -strict I&O -Nephrology following may need CRRT/iHD if no improvement in BP  #Transaminitis, suspect secondary to cardiac arrest and shock liver ~   -Trend hepatic function -avoid hepatotoxic agents,    Best practice:  Diet:  NPO Pain/Anxiety/Delirium protocol (if indicated): Yes (RASS goal -1) VAP protocol (if indicated): Yes DVT prophylaxis: Systemic AC GI prophylaxis: H2B Glucose control:  SSI Yes Central venous access:  Yes, and it is still needed Arterial line:  N/A Foley:  Yes, and it is still needed Mobility:  bed rest  PT/OT consulted: N/A Code Status:  full code Disposition: ICU   = Goals of Care =  Primary Emergency Contact: Rocafuerte,Piedad Standiford   Critical care time: 55 minutes        Almarie Nose DNP, CCRN, FNP-C, AGACNP-BC Acute Care & Family Nurse Practitioner [facility] PCCM on call pager [phone]

## 2023-12-09 NOTE — Progress Notes (Signed)
   2023-11-16 0400  Spiritual Encounters  Type of Visit Initial  Care provided to: Patient  Referral source Code page  Reason for visit Code  OnCall Visit Yes   Chaplain responded to Code Blue. Patient receiving immediate attention by Code team. No family present. Chaplain services available.

## 2023-12-09 DEATH — deceased

## 2023-12-15 ENCOUNTER — Ambulatory Visit: Admitting: Podiatry

## 2024-01-01 ENCOUNTER — Ambulatory Visit: Admitting: Dietician
